# Patient Record
Sex: Male | Born: 1935 | Race: White | Hispanic: No | Marital: Married | Smoking: Former smoker
Health system: Southern US, Community
[De-identification: ages and names within clinical notes are randomized; demographics above are authoritative.]

## PROBLEM LIST (undated history)

## (undated) DIAGNOSIS — M1990 Unspecified osteoarthritis, unspecified site: Secondary | ICD-10-CM

## (undated) DIAGNOSIS — I10 Essential (primary) hypertension: Secondary | ICD-10-CM

## (undated) DIAGNOSIS — E039 Hypothyroidism, unspecified: Secondary | ICD-10-CM

## (undated) DIAGNOSIS — I6529 Occlusion and stenosis of unspecified carotid artery: Secondary | ICD-10-CM

## (undated) DIAGNOSIS — I2510 Atherosclerotic heart disease of native coronary artery without angina pectoris: Secondary | ICD-10-CM

## (undated) DIAGNOSIS — I441 Atrioventricular block, second degree: Secondary | ICD-10-CM

## (undated) DIAGNOSIS — E119 Type 2 diabetes mellitus without complications: Secondary | ICD-10-CM

## (undated) DIAGNOSIS — E785 Hyperlipidemia, unspecified: Secondary | ICD-10-CM

## (undated) DIAGNOSIS — I350 Nonrheumatic aortic (valve) stenosis: Secondary | ICD-10-CM

## (undated) HISTORY — PX: EYE SURGERY: SHX253

## (undated) HISTORY — PX: BACK SURGERY: SHX140

## (undated) HISTORY — DX: Type 2 diabetes mellitus without complications: E11.9

## (undated) HISTORY — DX: Occlusion and stenosis of unspecified carotid artery: I65.29

## (undated) HISTORY — DX: Atherosclerotic heart disease of native coronary artery without angina pectoris: I25.10

## (undated) HISTORY — DX: Nonrheumatic aortic (valve) stenosis: I35.0

## (undated) HISTORY — DX: Essential (primary) hypertension: I10

## (undated) HISTORY — DX: Hyperlipidemia, unspecified: E78.5

---

## 1999-11-10 HISTORY — PX: CARDIAC CATHETERIZATION: SHX172

## 1999-11-11 ENCOUNTER — Inpatient Hospital Stay (HOSPITAL_COMMUNITY): Admission: AD | Admit: 1999-11-11 | Discharge: 1999-11-17 | Payer: Self-pay | Admitting: Cardiovascular Disease

## 1999-11-12 ENCOUNTER — Encounter: Payer: Self-pay | Admitting: Surgery

## 1999-11-13 ENCOUNTER — Encounter: Payer: Self-pay | Admitting: Surgery

## 1999-11-13 HISTORY — PX: CORONARY ARTERY BYPASS GRAFT: SHX141

## 1999-11-14 ENCOUNTER — Encounter: Payer: Self-pay | Admitting: Cardiovascular Disease

## 1999-11-15 ENCOUNTER — Encounter: Payer: Self-pay | Admitting: Surgery

## 1999-11-16 ENCOUNTER — Encounter: Payer: Self-pay | Admitting: Cardiovascular Disease

## 2012-11-03 ENCOUNTER — Other Ambulatory Visit (HOSPITAL_COMMUNITY): Payer: Self-pay | Admitting: Cardiovascular Disease

## 2012-11-03 DIAGNOSIS — I6529 Occlusion and stenosis of unspecified carotid artery: Secondary | ICD-10-CM

## 2012-11-14 ENCOUNTER — Encounter (HOSPITAL_COMMUNITY): Payer: Self-pay

## 2012-12-05 ENCOUNTER — Ambulatory Visit (HOSPITAL_COMMUNITY)
Admission: RE | Admit: 2012-12-05 | Discharge: 2012-12-05 | Disposition: A | Discharge status: Home / Self Care | Payer: Medicare Other | Source: Ambulatory Visit | Attending: Cardiovascular Disease | Admitting: Cardiovascular Disease

## 2012-12-05 DIAGNOSIS — I6529 Occlusion and stenosis of unspecified carotid artery: Secondary | ICD-10-CM | POA: Insufficient documentation

## 2012-12-05 NOTE — Progress Notes (Signed)
Carotid duplex completed 

## 2013-06-05 ENCOUNTER — Other Ambulatory Visit (HOSPITAL_COMMUNITY): Payer: Self-pay | Admitting: Cardiovascular Disease

## 2013-06-05 DIAGNOSIS — I25810 Atherosclerosis of coronary artery bypass graft(s) without angina pectoris: Secondary | ICD-10-CM

## 2013-06-05 DIAGNOSIS — I739 Peripheral vascular disease, unspecified: Secondary | ICD-10-CM

## 2013-06-20 ENCOUNTER — Ambulatory Visit (HOSPITAL_COMMUNITY)
Admission: RE | Admit: 2013-06-20 | Discharge: 2013-06-20 | Disposition: A | Discharge status: Home / Self Care | Payer: Medicare Other | Source: Ambulatory Visit | Attending: Cardiovascular Disease | Admitting: Cardiovascular Disease

## 2013-06-20 DIAGNOSIS — I739 Peripheral vascular disease, unspecified: Secondary | ICD-10-CM

## 2013-06-20 DIAGNOSIS — I6529 Occlusion and stenosis of unspecified carotid artery: Secondary | ICD-10-CM

## 2013-06-21 NOTE — Progress Notes (Signed)
Carotid Duplex Completed. Koralyn Prestage, RDMS, RVT  

## 2013-07-20 ENCOUNTER — Encounter: Payer: Self-pay | Admitting: Cardiovascular Disease

## 2013-07-20 ENCOUNTER — Ambulatory Visit (INDEPENDENT_AMBULATORY_CARE_PROVIDER_SITE_OTHER): Payer: Medicare Other | Admitting: Cardiovascular Disease

## 2013-07-20 VITALS — BP 146/78 | HR 55 | Ht 69.0 in | Wt 177.5 lb

## 2013-07-20 DIAGNOSIS — I350 Nonrheumatic aortic (valve) stenosis: Secondary | ICD-10-CM

## 2013-07-20 DIAGNOSIS — I359 Nonrheumatic aortic valve disorder, unspecified: Secondary | ICD-10-CM

## 2013-07-20 DIAGNOSIS — I2510 Atherosclerotic heart disease of native coronary artery without angina pectoris: Secondary | ICD-10-CM

## 2013-07-20 DIAGNOSIS — E785 Hyperlipidemia, unspecified: Secondary | ICD-10-CM

## 2013-07-20 DIAGNOSIS — I779 Disorder of arteries and arterioles, unspecified: Secondary | ICD-10-CM

## 2013-07-20 DIAGNOSIS — E119 Type 2 diabetes mellitus without complications: Secondary | ICD-10-CM

## 2013-07-20 DIAGNOSIS — I10 Essential (primary) hypertension: Secondary | ICD-10-CM

## 2013-07-20 NOTE — Assessment & Plan Note (Signed)
On statin therapy followed by his PCP 

## 2013-07-20 NOTE — Progress Notes (Signed)
07/20/2013 Thomas English   1932/12/14  [id number]  Primary Physician Desmond Dike, MD Primary Cardiologist: Runell Gess MD [name]   HPI:  The patient is a very pleasant 79 year old, mildly overweight, married Caucasian male, father of 2, grandfather to 1 grandchild who I last saw 6 months ago. He has a history of CAD status post coronary artery bypass grafting November 12, 2012, with a LIMA to his LAD, a vein graft to the first obtuse marginal branch, second obtuse marginal branch and to the RCA. His other problems include hypertension, hyperlipidemia and noninsulin-requiring diabetes. He does have bilateral internal carotid artery stenosis left greater than right which has remained stable over the last 6 months. He is neurologically asymptomatic. He also has mild aortic stenosis with valve area of 1.4 cm squared. His last Myoview performed October 06, 2011, was low risk. He is asymptomatic. Dr. Sheria Lang follows his lipid profile. Since I saw him in the office 6 months ago he is completely asymptomatic and specifically but denies chest pain or shortness of breath.     Current Outpatient Prescriptions  Medication Sig Dispense Refill  . amLODipine-benazepril (LOTREL) 10-20 MG per capsule Take 1 capsule by mouth daily.      Marland Kitchen aspirin EC 81 MG tablet Take 81 mg by mouth daily.      . carvedilol (COREG) 6.25 MG tablet Take 6.25 mg by mouth 2 (two) times daily with a meal.      . clopidogrel (PLAVIX) 75 MG tablet Take 1 tablet by mouth daily.      Marland Kitchen levothyroxine (SYNTHROID, LEVOTHROID) 100 MCG tablet Take 1 tablet by mouth daily.      . Lutein 6 MG CAPS Take 1 capsule by mouth daily.      . metFORMIN (GLUCOPHAGE) 500 MG tablet Take 500 mg by mouth daily with breakfast.      . simvastatin (ZOCOR) 20 MG tablet Take 1 tablet by mouth daily.       No current facility-administered medications for this visit.    No Known Allergies  History   Social History  . Marital  Status: Married    Spouse Name: N/A    Number of Children: N/A  . Years of Education: N/A   Occupational History  . Not on file.   Social History Main Topics  . Smoking status: Former Smoker -- 1.00 packs/day for 5 years    Types: Cigarettes    Quit date: 07/20/1973  . Smokeless tobacco: Never Used  . Alcohol Use: No  . Drug Use: No  . Sexually Active: Not on file   Other Topics Concern  . Not on file   Social History Narrative  . No narrative on file     Review of Systems: General: negative for chills, fever, night sweats or weight changes.  Cardiovascular: negative for chest pain, dyspnea on exertion, edema, orthopnea, palpitations, paroxysmal nocturnal dyspnea or shortness of breath Dermatological: negative for rash Respiratory: negative for cough or wheezing Urologic: negative for hematuria Abdominal: negative for nausea, vomiting, diarrhea, bright red blood per rectum, melena, or hematemesis Neurologic: negative for visual changes, syncope, or dizziness All other systems reviewed and are otherwise negative except as noted above.    Blood pressure 146/78, pulse 55, height 5\' 9"  (1.753 m), weight 177 lb 8 oz (80.513 kg).  General appearance: alert and no distress Neck: no adenopathy, no JVD, supple, symmetrical, trachea midline, thyroid not enlarged, symmetric, no tenderness/mass/nodules and bilateral carotid bruits Lungs: clear to auscultation  bilaterally Heart: typical aortic stenosis murmur, 2/6 Extremities: extremities normal, atraumatic, no cyanosis or edema  EKG sinus bradycardia at 55 without ST or T wave changes  ASSESSMENT AND PLAN:   Coronary artery disease Patient underwent coronary artery bypass grafting 11/12/12 with a LIMA to his LAD, a vein to first obtuse marginal branch, second obtuse marginal branch and to the RCA. He has not had a stress test since his procedure. He denies chest pain or shortness of breath.  Aortic stenosis His last 2-D echo  performed 03/31/12 revealed normal LV function with a valve area of 1.4 cm peak gradient of 30 mmHg and mean gradient of 25 mm mercury. We'll recheck a 2-D echo again in January of next year  Carotid artery disease Daube duplex ultrasound. Briefly performed earlier this month with mild right and moderate left ICA stenosis. He was neurologically asymptomatic. We'll continue to follow this semiannually.  Hyperlipidemia On statin therapy followed by his PCP      Runell Gess MD [facility], [facility] 07/20/2013 4:41 PM

## 2013-07-20 NOTE — Assessment & Plan Note (Signed)
Daube duplex ultrasound. Briefly performed earlier this month with mild right and moderate left ICA stenosis. He was neurologically asymptomatic. We'll continue to follow this semiannually.

## 2013-07-20 NOTE — Patient Instructions (Signed)
  We will see you back in follow up in 6 months with an extender and 12 months with Dr Allyson Sabal  Dr Allyson Sabal has ordered an echocardiogram and carotid doppler to be done in January 2015

## 2013-07-20 NOTE — Assessment & Plan Note (Signed)
Patient underwent coronary artery bypass grafting 11/12/12 with a LIMA to his LAD, a vein to first obtuse marginal branch, second obtuse marginal branch and to the RCA. He has not had a stress test since his procedure. He denies chest pain or shortness of breath.

## 2013-07-20 NOTE — Assessment & Plan Note (Signed)
His last 2-D echo performed 03/31/12 revealed normal LV function with a valve area of 1.4 cm peak gradient of 30 mmHg and mean gradient of 25 mm mercury. We'll recheck a 2-D echo again in January of next year

## 2013-09-04 ENCOUNTER — Encounter (HOSPITAL_COMMUNITY): Payer: Self-pay | Admitting: Cardiovascular Disease

## 2013-09-20 HISTORY — PX: NM MYOCAR PERF WALL MOTION: HXRAD629

## 2013-09-21 ENCOUNTER — Ambulatory Visit (HOSPITAL_COMMUNITY)
Admission: RE | Admit: 2013-09-21 | Discharge: 2013-09-21 | Disposition: A | Discharge status: Home / Self Care | Payer: Medicare Other | Source: Ambulatory Visit | Attending: Cardiovascular Disease | Admitting: Cardiovascular Disease

## 2013-09-21 DIAGNOSIS — I25810 Atherosclerosis of coronary artery bypass graft(s) without angina pectoris: Secondary | ICD-10-CM

## 2013-09-21 DIAGNOSIS — I2510 Atherosclerotic heart disease of native coronary artery without angina pectoris: Secondary | ICD-10-CM

## 2013-09-21 MED ORDER — AMINOPHYLLINE 25 MG/ML IV SOLN
75.0000 mg | Freq: Once | INTRAVENOUS | Status: AC
  Administered 2013-09-21: 75 mg via INTRAVENOUS

## 2013-09-21 MED ORDER — TECHNETIUM TC 99M SESTAMIBI GENERIC - CARDIOLITE
31.1000 | Freq: Once | INTRAVENOUS | Status: AC | PRN
  Administered 2013-09-21: 31.1 via INTRAVENOUS

## 2013-09-21 MED ORDER — REGADENOSON 0.4 MG/5ML IV SOLN
0.4000 mg | Freq: Once | INTRAVENOUS | Status: AC
  Administered 2013-09-21: 0.4 mg via INTRAVENOUS

## 2013-09-21 MED ORDER — TECHNETIUM TC 99M SESTAMIBI GENERIC - CARDIOLITE
10.7000 | Freq: Once | INTRAVENOUS | Status: AC | PRN
  Administered 2013-09-21: 11 via INTRAVENOUS

## 2013-09-21 NOTE — Procedures (Addendum)
[facility] [address] [place] [address] [place] [address] [phone]  Cardiology Nuclear Med Study  RMANI KELLOGG is a 79 y.o. male     MRN : [id number]     DOB: 1932-02-17  Procedure Date: 09/21/2013  Nuclear Med Background Indication for Stress Test:  Graft Patency History:  Asthma and CAD, CABGx4 11/13 Cardiac Risk Factors: Carotid Disease, Family History - CAD, History of Smoking, Hypertension, Lipids, NIDDM and PVD  Symptoms:  None   Nuclear Pre-Procedure Caffeine/Decaff Intake:  7:00pm NPO After: 5:00am   IV Site: R Antecubital  IV 0.9% NS with Angio Cath:  22g  Chest Size (in):  44 IV Started by: Koren Shiver, CNMT  Height: 5\' 9"  (1.753 m)  Cup Size: n/a  BMI:  Body mass index is 26.13 kg/(m^2). Weight:  177 lb (80.287 kg)   Tech Comments:  n/a    Nuclear Med Study 1 or 2 day study: 1 day  Stress Test Type:  Lexiscan  Order Authorizing Provider:  Nanetta Batty, MD   Resting Radionuclide: Technetium 51m Sestamibi  Resting Radionuclide Dose: 10.7 mCi   Stress Radionuclide:  Technetium 54m Sestamibi  Stress Radionuclide Dose: 31.1 mCi           Stress Protocol Rest HR: 53 Stress HR: 93  Rest BP: 136/76 Stress BP: 143/72  Exercise Time (min): n/a METS: n/a   Predicted Max HR: 142 bpm % Max HR: 65.49 bpm Rate Pressure Product: 46962  Dose of Adenosine (mg):  n/a Dose of Lexiscan: 0.4 mg  Dose of Atropine (mg): n/a Dose of Dobutamine: n/a mcg/kg/min (at max HR)  Stress Test Technologist: Esperanza Sheets, CCT Nuclear Technologist: Gonzella Lex, CNMT   Rest Procedure:  Myocardial perfusion imaging was performed at rest 45 minutes following the intravenous administration of Technetium 75m Sestamibi. Stress Procedure:  The patient received IV Lexiscan 0.4 mg over 15-seconds.  Technetium 47m Sestamibi injected at 30-seconds.  The patient became orthostatic, 75 mg of IV Aminophylline was administered with resolution of  symptom.  There were no significant changes with Lexiscan.  Quantitative spect images were obtained after a 45 minute delay.  Transient Ischemic Dilatation (Normal <1.22):  1.13 Lung/Heart Ratio (Normal <0.45):  0.31 QGS EDV:  72 ml QGS ESV:  22 ml LV Ejection Fraction: 70%     Rest ECG: NSR - Normal EKG  Stress ECG: Nonspecific (,1 mm ) ST depression and T wave inversion in leads V5-V6, II, III, F during Lexiscan infusion  QPS Raw Data Images:  Normal; no motion artifact; normal heart/lung ratio. Stress Images:  Normal homogeneous uptake in all areas of the myocardium. Rest Images:  Normal homogeneous uptake in all areas of the myocardium. Subtraction (SDS):  No evidence of ischemia. LV Wall Motion:  NL LV Function; NL Wall Motion  Impression Exercise Capacity:  Lexiscan with no exercise. BP Response:  Normal blood pressure response. Clinical Symptoms:  No significant symptoms noted. ECG Impression:  Nonspecific ST depression with Lexiscan infusion Comparison with Prior Nuclear Study: Previous study described mid-anterior wall ischemia, not appreciated on this study   Overall Impression:  Normal stress nuclear study.   Thurmon Fair, MD  09/21/2013 12:47 PM

## 2013-09-29 ENCOUNTER — Encounter: Payer: Self-pay | Admitting: *Deleted

## 2013-12-21 HISTORY — PX: TRANSTHORACIC ECHOCARDIOGRAM: SHX275

## 2013-12-26 ENCOUNTER — Ambulatory Visit (HOSPITAL_COMMUNITY)
Admission: RE | Admit: 2013-12-26 | Discharge: 2013-12-26 | Disposition: A | Discharge status: Home / Self Care | Payer: Medicare Other | Source: Ambulatory Visit | Attending: Cardiovascular Disease | Admitting: Cardiovascular Disease

## 2013-12-26 DIAGNOSIS — I779 Disorder of arteries and arterioles, unspecified: Secondary | ICD-10-CM

## 2013-12-26 DIAGNOSIS — I672 Cerebral atherosclerosis: Secondary | ICD-10-CM | POA: Insufficient documentation

## 2013-12-26 DIAGNOSIS — I739 Peripheral vascular disease, unspecified: Secondary | ICD-10-CM

## 2013-12-26 NOTE — Progress Notes (Signed)
Carotid Duplex Completed. °Thomas English,RVT °

## 2014-01-02 ENCOUNTER — Encounter: Payer: Self-pay | Admitting: *Deleted

## 2014-01-02 ENCOUNTER — Telehealth: Payer: Self-pay | Admitting: *Deleted

## 2014-01-02 DIAGNOSIS — I6529 Occlusion and stenosis of unspecified carotid artery: Secondary | ICD-10-CM

## 2014-01-02 NOTE — Telephone Encounter (Signed)
Order placed for repeat carotid doppler in 1 year 

## 2014-01-02 NOTE — Telephone Encounter (Signed)
Message copied by Marella BileVOGEL, Linn Clavin W. on Tue Jan 02, 2014  3:50 PM ------      Message from: Runell GessBERRY, JONATHAN J      Created: Mon Jan 01, 2014  3:52 PM       No change from prior study. Repeat in 12 months. ------

## 2014-01-04 ENCOUNTER — Ambulatory Visit (HOSPITAL_COMMUNITY)
Admission: RE | Admit: 2014-01-04 | Discharge: 2014-01-04 | Disposition: A | Discharge status: Home / Self Care | Payer: Medicare Other | Source: Ambulatory Visit | Attending: Cardiovascular Disease | Admitting: Cardiovascular Disease

## 2014-01-04 DIAGNOSIS — E785 Hyperlipidemia, unspecified: Secondary | ICD-10-CM | POA: Insufficient documentation

## 2014-01-04 DIAGNOSIS — I079 Rheumatic tricuspid valve disease, unspecified: Secondary | ICD-10-CM | POA: Insufficient documentation

## 2014-01-04 DIAGNOSIS — I2510 Atherosclerotic heart disease of native coronary artery without angina pectoris: Secondary | ICD-10-CM | POA: Insufficient documentation

## 2014-01-04 DIAGNOSIS — I359 Nonrheumatic aortic valve disorder, unspecified: Secondary | ICD-10-CM | POA: Insufficient documentation

## 2014-01-04 DIAGNOSIS — I350 Nonrheumatic aortic (valve) stenosis: Secondary | ICD-10-CM

## 2014-01-04 DIAGNOSIS — E119 Type 2 diabetes mellitus without complications: Secondary | ICD-10-CM | POA: Insufficient documentation

## 2014-01-04 DIAGNOSIS — I10 Essential (primary) hypertension: Secondary | ICD-10-CM | POA: Insufficient documentation

## 2014-01-04 NOTE — Progress Notes (Signed)
2D Echo Performed 10/25/2014    Thomas English, RCS  

## 2014-01-15 ENCOUNTER — Encounter: Payer: Self-pay | Admitting: *Deleted

## 2014-01-16 ENCOUNTER — Encounter: Payer: Self-pay | Admitting: Cardiovascular Disease

## 2014-01-17 ENCOUNTER — Ambulatory Visit: Payer: Medicare Other | Admitting: Cardiology

## 2014-02-07 ENCOUNTER — Ambulatory Visit (INDEPENDENT_AMBULATORY_CARE_PROVIDER_SITE_OTHER): Payer: Medicare Other | Admitting: Cardiology

## 2014-02-07 ENCOUNTER — Encounter: Payer: Self-pay | Admitting: Cardiology

## 2014-02-07 VITALS — BP 128/62 | HR 61 | Ht 71.0 in | Wt 179.8 lb

## 2014-02-07 DIAGNOSIS — E785 Hyperlipidemia, unspecified: Secondary | ICD-10-CM

## 2014-02-07 DIAGNOSIS — I2510 Atherosclerotic heart disease of native coronary artery without angina pectoris: Secondary | ICD-10-CM

## 2014-02-07 DIAGNOSIS — I6529 Occlusion and stenosis of unspecified carotid artery: Secondary | ICD-10-CM | POA: Insufficient documentation

## 2014-02-07 DIAGNOSIS — I739 Peripheral vascular disease, unspecified: Secondary | ICD-10-CM

## 2014-02-07 DIAGNOSIS — I359 Nonrheumatic aortic valve disorder, unspecified: Secondary | ICD-10-CM

## 2014-02-07 DIAGNOSIS — I350 Nonrheumatic aortic (valve) stenosis: Secondary | ICD-10-CM

## 2014-02-07 DIAGNOSIS — I779 Disorder of arteries and arterioles, unspecified: Secondary | ICD-10-CM

## 2014-02-07 DIAGNOSIS — E119 Type 2 diabetes mellitus without complications: Secondary | ICD-10-CM

## 2014-02-07 DIAGNOSIS — I10 Essential (primary) hypertension: Secondary | ICD-10-CM

## 2014-02-07 NOTE — Assessment & Plan Note (Signed)
No chest pain, no complaints.

## 2014-02-07 NOTE — Assessment & Plan Note (Signed)
Stable, asymptomatic. 

## 2014-02-07 NOTE — Patient Instructions (Signed)
Your aortic valve is tighter, which may cause shortness of breath, chest pain, or dizziness.  If these symptoms occur please call..  In the meantime will discuss with Dr. Allyson SabalBerry and decide what test he would like you to have that would define this more.  We will call you this afternoon.  Follow up with Dr. Allyson SabalBerry in 6 weeks.

## 2014-02-07 NOTE — Assessment & Plan Note (Signed)
Followed by PCP

## 2014-02-07 NOTE — Assessment & Plan Note (Signed)
Last lipids stable

## 2014-02-07 NOTE — Assessment & Plan Note (Signed)
controlled 

## 2014-02-07 NOTE — Assessment & Plan Note (Signed)
Now by Echo mod to severe AS.  Pt asymptomatic. Discussed with Dr. Allyson SabalBerry he would like to repeat Echo in 6 months.  Dr. Rennis GoldenHilty did discuss with Pt at time of visit - reviewing symptoms to call us for.  I also discussed these symptoms with the pt's wife.

## 2014-02-07 NOTE — Progress Notes (Signed)
02/07/2014   PCP: Desmond DikeAMERON,JOHN, MD   Chief Complaint  Patient presents with  . ROV 6 months    No complaints.    Primary Cardiologist: Dr. Allyson SabalBerry  HPI: 80 year old, mildly overweight, married Caucasian male, father of 2, grandfather to 1 grandchild who is followed by Dr. Allyson SabalBerry. He has a history of CAD status post coronary artery bypass grafting 2000, with a LIMA to his LAD, a vein graft to the first obtuse marginal branch, second obtuse marginal branch and to the RCA. His other problems include hypertension, hyperlipidemia and noninsulin-requiring diabetes. He does have bilateral internal carotid artery stenosis left greater than right which has remained stable over the last 6 months. He is neurologically asymptomatic. He also now has mod to severe aortic stenosis with valve area of 0.79 cm squared. His last Myoview performed October 06, 2011, was low risk. He is asymptomatic. Dr. Sheria Langameron follows his lipid profile. Since I saw him in the office 6 months ago he is completely asymptomatic denies chest pain or shortness of breath.  He shoveled snow yesterday without problems.      No Known Allergies  Current Outpatient Prescriptions  Medication Sig Dispense Refill  . amLODipine-benazepril (LOTREL) 10-20 MG per capsule Take 1 capsule by mouth daily.      Marland Kitchen. aspirin EC 81 MG tablet Take 81 mg by mouth daily.      . carvedilol (COREG) 6.25 MG tablet Take 6.25 mg by mouth 2 (two) times daily with a meal.      . clopidogrel (PLAVIX) 75 MG tablet Take 1 tablet by mouth daily.      Marland Kitchen. levothyroxine (SYNTHROID, LEVOTHROID) 100 MCG tablet Take 1 tablet by mouth daily.      . Lutein 6 MG CAPS Take 1 capsule by mouth daily.      . metFORMIN (GLUCOPHAGE) 500 MG tablet Take 500 mg by mouth daily with breakfast.      . simvastatin (ZOCOR) 20 MG tablet Take 1 tablet by mouth daily.       No current facility-administered medications for this visit.    Past Medical History  Diagnosis  Date  . Hypertension   . Hyperlipidemia   . Diabetes mellitus type 2, noninsulin dependent   . Carotid stenosis     bilateral internal, left greater than right, stable  . Aortic stenosis   . CAD (coronary artery disease)   . S/P CABG (coronary artery bypass graft) 10/1999    LIMA to LAD and a vein graft to the first OM branch, second OM branch, and to the RCA    Past Surgical History  Procedure Laterality Date  . Cardiac catheterization  11/10/1999    3-vessel disease, recommendation for CABG  . Coronary artery bypass graft  11/13/1999    LIMA to LAD and a vein graft to the first OM branch, second OM branch, and to the RCA (Dr. Wayland SalinasB. Bartle)  . Transthoracic echocardiogram  12/2013    EF 55-60%, grade 1 diastolic dysfunction, high ventricular filling pressure; severe AV stenosis; calcified MV annulus; LA & RA mildly dilated; atrial septal aneurysm  . Nm myocar perf wall motion  09/2013    normal lexiscan myoview    ZOX:WRUEAVW:UJROS:General:no colds or fevers, no weight changes Skin:no rashes or ulcers HEENT:no blurred vision, no congestion CV:see HPI PUL:see HPI GI:no diarrhea constipation or melena, no indigestion GU:no hematuria, no dysuria MS:no joint pain, no claudication Neuro:no syncope, no lightheadedness Endo:+ diabetes followed  by PCP, no thyroid disease  PHYSICAL EXAM BP 128/62  Pulse 61  Ht 5\' 11"  (1.803 m)  Wt 179 lb 12.8 oz (81.557 kg)  BMI 25.09 kg/m2 General:Pleasant affect, NAD Skin:Warm and dry, brisk capillary refill HEENT:normocephalic, sclera clear, mucus membranes moist Neck:supple, no JVD, harsh bil carotid bruits  Heart:S1S2 RRR with 2-3/6 systolic aortic murmur best heart 2nd ICS rt of sternum but do hear laterally, no radiation to back or abd. , no gallup, rub or click Lungs:clear without rales, rhonchi, or wheezes WUJ:WJXB, non tender, + BS, do not palpate liver spleen or masses Ext:no lower ext edema, 2+ pedal pulses, 2+ radial pulses Neuro:alert and  oriented, MAE, follows commands, + facial symmetry  EKG:SR 1st degree AV block PR interval 264 ms no acute changes  ASSESSMENT AND PLAN Aortic stenosis Now by Echo mod to severe AS.  Pt asymptomatic. Discussed with Dr. Allyson Sabal he would like to repeat Echo in 6 months.  Dr. Rennis Golden did discuss with Pt at time of visit - reviewing symptoms to call us for.  I also discussed these symptoms with the pt's wife.      Carotid artery disease, hx CABG 2000, LIMA-LAD;VG-1st & 2nd OM of LCX VG-RCA, LOW RISK NUC 2012 No chest pain, no complaints.  Carotid stenosis, bil, last dopplers 12/2013 50-69% Stable, asymptomatic  Hyperlipidemia Last lipids stable  Type 2 diabetes mellitus Followed by PCP  Essential hypertension controlled

## 2014-03-21 ENCOUNTER — Ambulatory Visit (INDEPENDENT_AMBULATORY_CARE_PROVIDER_SITE_OTHER): Payer: Medicare Other | Admitting: Cardiovascular Disease

## 2014-03-21 ENCOUNTER — Encounter: Payer: Self-pay | Admitting: Cardiovascular Disease

## 2014-03-21 VITALS — BP 148/64 | HR 60 | Ht 71.0 in | Wt 177.1 lb

## 2014-03-21 DIAGNOSIS — I739 Peripheral vascular disease, unspecified: Principal | ICD-10-CM

## 2014-03-21 DIAGNOSIS — I2510 Atherosclerotic heart disease of native coronary artery without angina pectoris: Secondary | ICD-10-CM

## 2014-03-21 DIAGNOSIS — I779 Disorder of arteries and arterioles, unspecified: Secondary | ICD-10-CM

## 2014-03-21 DIAGNOSIS — I350 Nonrheumatic aortic (valve) stenosis: Secondary | ICD-10-CM

## 2014-03-21 DIAGNOSIS — I359 Nonrheumatic aortic valve disorder, unspecified: Secondary | ICD-10-CM

## 2014-03-21 DIAGNOSIS — I10 Essential (primary) hypertension: Secondary | ICD-10-CM

## 2014-03-21 DIAGNOSIS — E785 Hyperlipidemia, unspecified: Secondary | ICD-10-CM

## 2014-03-21 NOTE — Assessment & Plan Note (Addendum)
His aortic valve area has decreased from 1.4 cm squared to .79 cm squared. He is completely asymptomatic. We'll continue to follow semiannually.

## 2014-03-21 NOTE — Assessment & Plan Note (Signed)
Status post coronary artery bypass grafting 11/12/12 with a LIMA to his LAD, vein graft to the first obtuse marginal branch, second obtuse marginal branch and to the RCA. His last Myoview stress test was performed prior to that in 2012. He denies chest pain or shortness of breath. He is diabetic and will require repeat Myoview stress testing every 3 years scheduled for November of this year.

## 2014-03-21 NOTE — Patient Instructions (Signed)
Your physician has requested that you have an echocardiogram in August. Echocardiography is a painless test that uses sound waves to create images of your heart. It provides your doctor with information about the size and shape of your heart and how well your heart's chambers and valves are working. This procedure takes approximately one hour. There are no restrictions for this procedure.  Your physician has requested that you have a lexiscan myoview in November. For further information please visit [email or web address]. Please follow instruction sheet, as given.  Dr Allyson SabalBerry wants you to follow-up in November - after your stress test. You will receive a reminder letter in the mail two months in advance. If you don't receive a letter, please call our office to schedule the follow-up appointment.

## 2014-03-21 NOTE — Assessment & Plan Note (Signed)
History of moderate bilateral ICA stenosis followed by duplex ultrasound which has remained stable. He is neurologically asymptomatic

## 2014-03-21 NOTE — Assessment & Plan Note (Signed)
Controlled on current medications 

## 2014-03-21 NOTE — Assessment & Plan Note (Signed)
On statin therapy followed by his PCP 

## 2014-03-21 NOTE — Progress Notes (Signed)
03/21/2014 Thomas English   07/09/1932  [id number]  Primary Physician Desmond DikeAMERON,JOHN, MD Primary Cardiologist: Runell GessJonathan J. Dareth Andrew MD Roseanne RenoFACP,FACC,FAHA, FSCAI   HPI:  The patient is a very pleasant 80 year old, mildly overweight, married Caucasian male, father of 2, grandfather to 1 grandchild who I last saw 6 months ago. He has a history of CAD status post coronary artery bypass grafting November 12, 2012, with a LIMA to his LAD, a vein graft to the first obtuse marginal branch, second obtuse marginal branch and to the RCA. His other problems include hypertension, hyperlipidemia and noninsulin-requiring diabetes. He does have bilateral internal carotid artery stenosis left greater than right which has remained stable over the last 6 months. He is neurologically asymptomatic. He also has mild aortic stenosis with valve area of 1.4 cm squared. His last Myoview performed October 06, 2011, was low risk. He is asymptomatic. Dr. Sheria Langameron follows his lipid profile. Since I saw him in the office 6 months ago he is completely asymptomatic and specifically but denies chest pain or shortness of breath.recent 2-D echo showed progression of his aortic stenosis now in the severe range with a valve area of 0. 7 9 cm. His carotid Dopplers likewise are moderately severe remained stable and he is neurologically asymptomatic.     Current Outpatient Prescriptions  Medication Sig Dispense Refill  . amLODipine-benazepril (LOTREL) 10-20 MG per capsule Take 1 capsule by mouth daily.      Marland Kitchen. aspirin EC 81 MG tablet Take 81 mg by mouth daily.      . carvedilol (COREG) 6.25 MG tablet Take 6.25 mg by mouth 2 (two) times daily with a meal.      . clopidogrel (PLAVIX) 75 MG tablet Take 1 tablet by mouth daily.      Marland Kitchen. levothyroxine (SYNTHROID, LEVOTHROID) 100 MCG tablet Take 1 tablet by mouth daily.      . Lutein 6 MG CAPS Take 1 capsule by mouth daily.      . metFORMIN (GLUCOPHAGE) 500 MG tablet Take 500 mg by mouth daily  with breakfast.      . simvastatin (ZOCOR) 20 MG tablet Take 1 tablet by mouth daily.       No current facility-administered medications for this visit.    No Known Allergies  History   Social History  . Marital Status: Married    Spouse Name: N/A    Number of Children: 2  . Years of Education: GED   Occupational History  . Not on file.   Social History Main Topics  . Smoking status: Former Smoker -- 1.00 packs/day for 5 years    Types: Cigarettes    Quit date: 07/20/1973  . Smokeless tobacco: Never Used  . Alcohol Use: No  . Drug Use: No  . Sexual Activity: Not on file   Other Topics Concern  . Not on file   Social History Narrative  . No narrative on file     Review of Systems: General: negative for chills, fever, night sweats or weight changes.  Cardiovascular: negative for chest pain, dyspnea on exertion, edema, orthopnea, palpitations, paroxysmal nocturnal dyspnea or shortness of breath Dermatological: negative for rash Respiratory: negative for cough or wheezing Urologic: negative for hematuria Abdominal: negative for nausea, vomiting, diarrhea, bright red blood per rectum, melena, or hematemesis Neurologic: negative for visual changes, syncope, or dizziness All other systems reviewed and are otherwise negative except as noted above.    Blood pressure 148/64, pulse 60, height 5\' 11"  (1.803  m), weight 177 lb 1.6 oz (80.332 kg).  General appearance: alert and no distress Neck: no adenopathy, no JVD, supple, symmetrical, trachea midline, thyroid not enlarged, symmetric, no tenderness/mass/nodules and loud bilateral carotid bruits Lungs: clear to auscultation bilaterally Heart: 3/6 outflow tract murmur consistent with severe aortic stenosis Extremities: extremities normal, atraumatic, no cyanosis or edema  EKG not performed today  ASSESSMENT AND PLAN:   Carotid artery disease, hx CABG 2000, LIMA-LAD;VG-1st & 2nd OM of LCX VG-RCA, LOW RISK NUC 2012 History  of moderate bilateral ICA stenosis followed by duplex ultrasound which has remained stable. He is neurologically asymptomatic  Essential hypertension Controlled on current medications  Hyperlipidemia On statin therapy followed by his PCP  Aortic stenosis His aortic valve area has decreased from 1.4 cm squared to .79 cm squared. He is completely asymptomatic. We'll continue to follow semiannually.  Coronary artery disease Status post coronary artery bypass grafting 11/12/12 with a LIMA to his LAD, vein graft to the first obtuse marginal branch, second obtuse marginal branch and to the RCA. His last Myoview stress test was performed prior to that in 2012. He denies chest pain or shortness of breath. He is diabetic and will require repeat Myoview stress testing every 3 years scheduled for November of this year.      Runell Gess MD FACP,FACC,FAHA, [facility] 03/21/2014 10:32 AM

## 2014-10-10 ENCOUNTER — Encounter (HOSPITAL_COMMUNITY): Payer: Self-pay | Admitting: *Deleted

## 2014-10-10 ENCOUNTER — Telehealth (HOSPITAL_COMMUNITY): Payer: Self-pay

## 2014-10-10 NOTE — Telephone Encounter (Signed)
Encounter complete. 

## 2014-10-12 ENCOUNTER — Telehealth: Payer: Self-pay | Admitting: *Deleted

## 2014-10-12 NOTE — Telephone Encounter (Signed)
Nuclear test for 10/27 is cancelled per Dr Allyson SabalBerry.  Patient had a nuclear test in 2014 and Dr Allyson SabalBerry wants one every 3 years. Patient is asymptomatic and insurance will not pay for testing at this point. Patient notified and agreeable that test is cancelled.

## 2014-10-15 ENCOUNTER — Telehealth: Payer: Self-pay | Admitting: *Deleted

## 2014-10-15 NOTE — Telephone Encounter (Signed)
Message copied by Marella BileVOGEL, Jelani Trueba W. on Mon Oct 15, 2014  9:38 AM ------      Message from: Runell GessBERRY, JONATHAN J      Created: Sat Oct 13, 2014  2:49 PM       Can wait 3 years from 2014 to order            JJB      ----- Message -----         From: Clide Daleshonda W Williams         Sent: 10/05/2014   8:52 AM           To: Marella BileKathryn W Jolea Dolle, RN, Runell GessJonathan J Berry, MD            Patient is scheduled for a nuc study 10/16/14.  In JB's last note of 03/21/14, he states that patient needs this q 3 years unless he presents w/sx.  Patient had nuc study in Oct 2014.  Not sure I can get an [place].  Let me know regarding whether to try to get auth or not.            Thanks!!            Rhonda       ------

## 2014-10-16 ENCOUNTER — Encounter (HOSPITAL_COMMUNITY): Payer: Medicare Other

## 2014-10-23 ENCOUNTER — Encounter: Payer: Self-pay | Admitting: Cardiovascular Disease

## 2014-10-23 ENCOUNTER — Ambulatory Visit (INDEPENDENT_AMBULATORY_CARE_PROVIDER_SITE_OTHER): Payer: Medicare Other | Admitting: Cardiovascular Disease

## 2014-10-23 VITALS — BP 132/78 | HR 67 | Ht 71.0 in | Wt 176.9 lb

## 2014-10-23 DIAGNOSIS — I10 Essential (primary) hypertension: Secondary | ICD-10-CM

## 2014-10-23 DIAGNOSIS — E785 Hyperlipidemia, unspecified: Secondary | ICD-10-CM

## 2014-10-23 DIAGNOSIS — I6529 Occlusion and stenosis of unspecified carotid artery: Secondary | ICD-10-CM

## 2014-10-23 DIAGNOSIS — I350 Nonrheumatic aortic (valve) stenosis: Secondary | ICD-10-CM

## 2014-10-23 NOTE — Assessment & Plan Note (Signed)
History of moderate to moderately severe bilateral internal carotid artery stenosis by duplex ultrasound most recently in January of this year. We will recheck carotid Doppler studies. He is neurologically a symptomatically on clopidogrel

## 2014-10-23 NOTE — Assessment & Plan Note (Signed)
On statin therapy, followed by his PCP 

## 2014-10-23 NOTE — Assessment & Plan Note (Signed)
Controlled on current medications 

## 2014-10-23 NOTE — Assessment & Plan Note (Addendum)
Moderate aortic stenosis by 2-D echocardiogram last checked 01/04/14 with a valve area of 0.79 cm2 and normal LV function. He does have moderate dyspnea on exertion but denies chest pain. We will recheck a 2-D echocardiogram in February of next year

## 2014-10-23 NOTE — Patient Instructions (Signed)
  We will see you back in follow up in 6 months with Dr Allyson SabalBerry.   Dr Allyson SabalBerry has ordered: 1.  Echocardiogram (to be done in February). Echocardiography is a painless test that uses sound waves to create images of your heart. It provides your doctor with information about the size and shape of your heart and how well your heart's chambers and valves are working. This procedure takes approximately one hour. There are no restrictions for this procedure.   2. Carotid Duplex (to be done in February)- This test is an ultrasound of the carotid arteries in your neck. It looks at blood flow through these arteries that supply the brain with blood. Allow one hour for this exam. There are no restrictions or special instructions.

## 2014-10-23 NOTE — Progress Notes (Signed)
10/23/2014 Thomas English   04/01/1932  [id number]  Primary Physician Desmond DikeAMERON,JOHN, MD Primary Cardiologist: Runell GessJonathan J. Kashay Cavenaugh MD Roseanne RenoFACP,FACC,FAHA, FSCAI   HPI:  The patient is a very pleasant 80 year old, mildly overweight, married Caucasian male, father of 2, grandfather to 1 grandchild who I last saw 6 months ago. He has a history of CAD status post coronary artery bypass grafting November 12, 2012, with a LIMA to his LAD, a vein graft to the first obtuse marginal branch, second obtuse marginal branch and to the RCA. His other problems include hypertension, hyperlipidemia and noninsulin-requiring diabetes. He does have bilateral internal carotid artery stenosis left greater than right which has remained stable over the last 6 months. He is neurologically asymptomatic. He also has mild aortic stenosis with valve area of 1.4 cm squared. His last Myoview performed October 06, 2011, was low risk. He is asymptomatic. Dr. Sheria Langameron follows his lipid profile. Recent 2-D echo showed progression of his aortic stenosis now in the severe range with a valve area of 0. 7 9 cm. His carotid Dopplers likewise are moderately severe remained stable and he is neurologically asymptomatic. Since I saw him 6 months ago he's remained clinically stable with mild dyspnea but no chest pain   Current Outpatient Prescriptions  Medication Sig Dispense Refill  . amLODipine-benazepril (LOTREL) 10-20 MG per capsule Take 1 capsule by mouth daily.    . carvedilol (COREG) 6.25 MG tablet Take 6.25 mg by mouth 2 (two) times daily with a meal.    . clopidogrel (PLAVIX) 75 MG tablet Take 1 tablet by mouth daily.    Marland Kitchen. levothyroxine (SYNTHROID, LEVOTHROID) 100 MCG tablet Take 1 tablet by mouth daily.    . Lutein 6 MG CAPS Take 1 capsule by mouth daily.    . metFORMIN (GLUCOPHAGE) 500 MG tablet Take 500 mg by mouth daily with breakfast.    . simvastatin (ZOCOR) 20 MG tablet Take 1 tablet by mouth daily.     No current  facility-administered medications for this visit.    No Known Allergies  History   Social History  . Marital Status: Married    Spouse Name: N/A    Number of Children: 2  . Years of Education: GED   Occupational History  . Not on file.   Social History Main Topics  . Smoking status: Former Smoker -- 1.00 packs/day for 5 years    Types: Cigarettes    Quit date: 07/20/1973  . Smokeless tobacco: Never Used  . Alcohol Use: No  . Drug Use: No  . Sexual Activity: Not on file   Other Topics Concern  . Not on file   Social History Narrative     Review of Systems: General: negative for chills, fever, night sweats or weight changes.  Cardiovascular: negative for chest pain, dyspnea on exertion, edema, orthopnea, palpitations, paroxysmal nocturnal dyspnea or shortness of breath Dermatological: negative for rash Respiratory: negative for cough or wheezing Urologic: negative for hematuria Abdominal: negative for nausea, vomiting, diarrhea, bright red blood per rectum, melena, or hematemesis Neurologic: negative for visual changes, syncope, or dizziness All other systems reviewed and are otherwise negative except as noted above.    Blood pressure 132/78, pulse 67, height 5\' 11"  (1.803 m), weight 176 lb 14.4 oz (80.241 kg).  General appearance: alert and no distress Neck: no adenopathy, no JVD, supple, symmetrical, trachea midline, thyroid not enlarged, symmetric, no tenderness/mass/nodules and bilateral carotid bruits Lungs: clear to auscultation bilaterally Heart: 2/6 systolic ejection murmur  consistent with aortic stenosis Extremities: extremities normal, atraumatic, no cyanosis or edema  EKG normal sinus rhythm at 67 without ST or T-wave changes  ASSESSMENT AND PLAN:   Carotid artery disease, hx CABG 2000, LIMA-LAD;VG-1st & 2nd OM of LCX VG-RCA, LOW RISK NUC 2012 History of moderate to moderately severe bilateral internal carotid artery stenosis by duplex ultrasound most  recently in January of this year. We will recheck carotid Doppler studies. He is neurologically a symptomatically on clopidogrel  Aortic stenosis Moderate aortic stenosis by 2-D echocardiogram last checked 01/04/14 with a valve area of 0.79 cm2 and normal LV function. He does have moderate dyspnea on exertion but denies chest pain. We will recheck a 2-D echocardiogram in February of next year  Essential hypertension Controlled on current medications  Hyperlipidemia On statin therapy, followed by his PCP      Runell GessJonathan J. Teiara Baria MD [facility] HospitalFACP,FACC,FAHA, [facility] Center[facility]SCAI 10/23/2014 11:20 AM

## 2015-02-05 ENCOUNTER — Encounter: Payer: Self-pay | Admitting: Cardiovascular Disease

## 2015-02-28 ENCOUNTER — Ambulatory Visit (HOSPITAL_COMMUNITY)
Admission: RE | Admit: 2015-02-28 | Discharge: 2015-02-28 | Disposition: A | Discharge status: Home / Self Care | Payer: Medicare Other | Source: Ambulatory Visit | Attending: Cardiology | Admitting: Cardiology

## 2015-02-28 ENCOUNTER — Telehealth: Payer: Self-pay | Admitting: Cardiovascular Disease

## 2015-02-28 DIAGNOSIS — I350 Nonrheumatic aortic (valve) stenosis: Secondary | ICD-10-CM | POA: Diagnosis not present

## 2015-02-28 NOTE — Progress Notes (Signed)
2D Echocardiogram Complete.  02/28/2015   Indy Kuck, RDCS  

## 2015-02-28 NOTE — Telephone Encounter (Signed)
Pt need to have tooth extracted. Can he stop his Plavix for several days?

## 2015-02-28 NOTE — Telephone Encounter (Signed)
He can stop his Plavix for his dental procedure and restarted afterwards

## 2015-02-28 NOTE — Telephone Encounter (Signed)
Spoke to wife  Appointment has not been set up yet. He will be seeing  Dr Apolinar JunesBrandon and Adele DanAmy Williams [place] [address] [place]  Wife is aware have to defer to Dr Allyson SabalBERRY-  Will contact wife with answer

## 2015-03-01 ENCOUNTER — Encounter: Payer: Self-pay | Admitting: *Deleted

## 2015-03-01 ENCOUNTER — Telehealth: Payer: Self-pay | Admitting: *Deleted

## 2015-03-01 DIAGNOSIS — I350 Nonrheumatic aortic (valve) stenosis: Secondary | ICD-10-CM

## 2015-03-01 NOTE — Telephone Encounter (Signed)
-----   Message from Runell GessJonathan J Berry, MD sent at 03/01/2015 10:01 AM EST ----- Nl LV fxn. Severe AS. No change from prior 2D last year. Repeat in 12 months

## 2015-03-01 NOTE — Telephone Encounter (Signed)
Order placed for repeat echo in 1 year 

## 2015-03-01 NOTE — Telephone Encounter (Signed)
Spoke to patient's wife. Notified her that patient may stop their plavix 5-7 days prior to the dental procedure and that it should be restarted the day after the procedure. She voiced understanding.

## 2015-05-07 ENCOUNTER — Ambulatory Visit (INDEPENDENT_AMBULATORY_CARE_PROVIDER_SITE_OTHER): Payer: Medicare Other | Admitting: Cardiovascular Disease

## 2015-05-07 ENCOUNTER — Encounter: Payer: Self-pay | Admitting: Cardiovascular Disease

## 2015-05-07 VITALS — BP 112/54 | HR 55 | Ht 71.0 in | Wt 173.0 lb

## 2015-05-07 DIAGNOSIS — I739 Peripheral vascular disease, unspecified: Secondary | ICD-10-CM

## 2015-05-07 DIAGNOSIS — I10 Essential (primary) hypertension: Secondary | ICD-10-CM

## 2015-05-07 DIAGNOSIS — E785 Hyperlipidemia, unspecified: Secondary | ICD-10-CM

## 2015-05-07 DIAGNOSIS — I2510 Atherosclerotic heart disease of native coronary artery without angina pectoris: Secondary | ICD-10-CM

## 2015-05-07 DIAGNOSIS — I350 Nonrheumatic aortic (valve) stenosis: Secondary | ICD-10-CM

## 2015-05-07 DIAGNOSIS — I779 Disorder of arteries and arterioles, unspecified: Secondary | ICD-10-CM | POA: Diagnosis not present

## 2015-05-07 NOTE — Assessment & Plan Note (Signed)
History of severe aortic stenosis with recent 2-D echo performed in March of this year revealing normal LV size, mild LVH with a valve area 0.71 and a peak gradient of 78 mmHg. He is asymptomatic. We talked about the future possibility of standard surgical AVR versus TAVR.

## 2015-05-07 NOTE — Assessment & Plan Note (Signed)
History of coronary artery disease status post bypass surgery in 2000. His last Myoview performed in 2012 is low risk. He denies chest pain or shortness of breath.

## 2015-05-07 NOTE — Assessment & Plan Note (Signed)
History of coronary artery disease with moderately severe bilateral internal carotid artery stenosis but the proximal ultrasound performed last year. Neurologically isn't manic on clopidogrel. We will repeat the study.

## 2015-05-07 NOTE — Assessment & Plan Note (Signed)
history of hypertension blood pressure measured at 112/54. He is on amlodipine, benazepril and carvedilol. Continue current meds at current dosing

## 2015-05-07 NOTE — Assessment & Plan Note (Signed)
History of hyperlipidemia on simvastatin 20 mg a day followed by his PCP 

## 2015-05-07 NOTE — Progress Notes (Signed)
05/07/2015 Thomas English   12/26/1931  [id number]  Primary Physician Desmond DikeAMERON,JOHN, MD Primary Cardiologist: Runell GessJonathan J. Jisella Ashenfelter MD Roseanne RenoFACP,FACC,FAHA, FSCAI   HPI:  The patient is a very pleasant 81 year old, mildly overweight, married Caucasian male, father of 2, grandfather to 1 grandchild who I last saw 6 months ago. He has a history of CAD status post coronary artery bypass grafting in  2000, with a LIMA to his LAD, a vein graft to the first obtuse marginal branch, second obtuse marginal branch and to the RCA. His other problems include hypertension, hyperlipidemia and noninsulin-requiring diabetes. He does have bilateral internal carotid artery stenosis left greater than right which has remained stable over the last 6 months. He is neurologically asymptomatic. He also has mild aortic stenosis with valve area of 1.4 cm squared. His last Myoview performed October 06, 2011, was low risk. He is asymptomatic. Dr. Sheria Langameron follows his lipid profile. Recent 2-D echo showed progression of his aortic stenosis now in the severe range with a valve area of 0. 71 cm. His carotid Dopplers likewise are moderately severe remained stable and he is neurologically asymptomatic. Since I saw him 6 months ago he's remained clinically stable with mild dyspnea but no chest pain   Current Outpatient Prescriptions  Medication Sig Dispense Refill  . amLODipine (NORVASC) 10 MG tablet Take 10 mg by mouth every morning.  6  . benazepril (LOTENSIN) 20 MG tablet Take 20 mg by mouth every morning.  6  . carvedilol (COREG) 6.25 MG tablet Take 6.25 mg by mouth 2 (two) times daily with a meal.    . clopidogrel (PLAVIX) 75 MG tablet Take 1 tablet by mouth daily.    Marland Kitchen. levothyroxine (SYNTHROID, LEVOTHROID) 100 MCG tablet Take 1 tablet by mouth daily.    . Lutein 6 MG CAPS Take 1 capsule by mouth daily.    . metFORMIN (GLUCOPHAGE) 500 MG tablet Take 500 mg by mouth daily with breakfast.    . simvastatin (ZOCOR) 20 MG tablet Take  1 tablet by mouth daily.     No current facility-administered medications for this visit.    No Known Allergies  History   Social History  . Marital Status: Married    Spouse Name: N/A  . Number of Children: 2  . Years of Education: GED   Occupational History  . Not on file.   Social History Main Topics  . Smoking status: Former Smoker -- 1.00 packs/day for 5 years    Types: Cigarettes    Quit date: 07/20/1973  . Smokeless tobacco: Never Used  . Alcohol Use: No  . Drug Use: No  . Sexual Activity: Not on file   Other Topics Concern  . Not on file   Social History Narrative     Review of Systems: General: negative for chills, fever, night sweats or weight changes.  Cardiovascular: negative for chest pain, dyspnea on exertion, edema, orthopnea, palpitations, paroxysmal nocturnal dyspnea or shortness of breath Dermatological: negative for rash Respiratory: negative for cough or wheezing Urologic: negative for hematuria Abdominal: negative for nausea, vomiting, diarrhea, bright red blood per rectum, melena, or hematemesis Neurologic: negative for visual changes, syncope, or dizziness All other systems reviewed and are otherwise negative except as noted above.    Blood pressure 112/54, pulse 55, height 5\' 11"  (1.803 m), weight 173 lb (78.472 kg).  General appearance: alert and no distress Neck: no adenopathy, no JVD, supple, symmetrical, trachea midline, thyroid not enlarged, symmetric, no tenderness/mass/nodules and bilateral  carotid bruits Lungs: clear to auscultation bilaterally Heart: 2/6 outflow tract murmur consistent with aortic stenosis Extremities: extremities normal, atraumatic, no cyanosis or edema  EKG sinus  55 without ST or T-wave changes. I personally reviewed this EKG  ASSESSMENT AND PLAN:   Hyperlipidemia History of hyperlipidemia on simvastatin 20 mg a day followed by his PCP   Essential hypertension history of hypertension blood pressure  measured at 112/54. He is on amlodipine, benazepril and carvedilol. Continue current meds at current dosing   Coronary artery disease History of coronary artery disease status post bypass surgery in 2000. His last Myoview performed in 2012 is low risk. He denies chest pain or shortness of breath.   Carotid stenosis, bil, last dopplers 12/2013 50-69% History of coronary artery disease with moderately severe bilateral internal carotid artery stenosis but the proximal ultrasound performed last year. Neurologically isn't manic on clopidogrel. We will repeat the study.   Aortic stenosis History of severe aortic stenosis with recent 2-D echo performed in March of this year revealing normal LV size, mild LVH with a valve area 0.71 and a peak gradient of 78 mmHg. He is asymptomatic. We talked about the future possibility of standard surgical AVR versus TAVR.       Runell GessJonathan J. Juvia Aerts MD FACP,FACC,FAHA, [facility]SCAI 05/07/2015 10:49 AM

## 2015-05-07 NOTE — Patient Instructions (Addendum)
Dr Allyson SabalBerry has requested that you have a carotid duplex now. This test is an ultrasound of the carotid arteries in your neck. It looks at blood flow through these arteries that supply the brain with blood. Allow one hour for this exam. There are no restrictions or special instructions.  Your physician has requested that you have an echocardiogram in March 2017. Echocardiography is a painless test that uses sound waves to create images of your heart. It provides your doctor with information about the size and shape of your heart and how well your heart's chambers and valves are working. This procedure takes approximately one hour. There are no restrictions for this procedure.  Your physician recommends that you schedule a follow-up appointment in 6 months with an extender.  Dr Allyson SabalBerry wants you to follow-up in 12 months.  You will receive a reminder letter in the mail two months in advance. If you don't receive a letter, please call our office to schedule the follow-up appointment.

## 2015-05-17 ENCOUNTER — Ambulatory Visit (HOSPITAL_COMMUNITY)
Admission: RE | Admit: 2015-05-17 | Discharge: 2015-05-17 | Disposition: A | Discharge status: Home / Self Care | Payer: Medicare Other | Source: Ambulatory Visit | Attending: Cardiology | Admitting: Cardiology

## 2015-05-17 DIAGNOSIS — I779 Disorder of arteries and arterioles, unspecified: Secondary | ICD-10-CM | POA: Diagnosis present

## 2015-05-17 DIAGNOSIS — I739 Peripheral vascular disease, unspecified: Secondary | ICD-10-CM

## 2015-05-22 ENCOUNTER — Encounter: Payer: Self-pay | Admitting: *Deleted

## 2015-11-04 ENCOUNTER — Encounter: Payer: Self-pay | Admitting: Physician Assistant

## 2015-11-04 ENCOUNTER — Ambulatory Visit (INDEPENDENT_AMBULATORY_CARE_PROVIDER_SITE_OTHER): Payer: Medicare Other | Admitting: Physician Assistant

## 2015-11-04 VITALS — BP 138/62 | HR 57 | Ht 71.0 in | Wt 174.8 lb

## 2015-11-04 DIAGNOSIS — I350 Nonrheumatic aortic (valve) stenosis: Secondary | ICD-10-CM

## 2015-11-04 DIAGNOSIS — I6523 Occlusion and stenosis of bilateral carotid arteries: Secondary | ICD-10-CM | POA: Diagnosis not present

## 2015-11-04 DIAGNOSIS — I739 Peripheral vascular disease, unspecified: Principal | ICD-10-CM

## 2015-11-04 DIAGNOSIS — I10 Essential (primary) hypertension: Secondary | ICD-10-CM | POA: Diagnosis not present

## 2015-11-04 DIAGNOSIS — I779 Disorder of arteries and arterioles, unspecified: Secondary | ICD-10-CM | POA: Diagnosis not present

## 2015-11-04 DIAGNOSIS — E785 Hyperlipidemia, unspecified: Secondary | ICD-10-CM

## 2015-11-04 NOTE — Progress Notes (Signed)
Patient ID: TARIK TEIXEIRA, male   DOB: 1932/09/28, 81 y.o.   MRN: [id number]    Date:  11/04/2015   ID:  WAYLEN DEPAOLO, DOB 01/26/1932, MRN [id number]  PCP:  Desmond Dike, MD  Primary Cardiologist:  Allyson Sabal  Chief Complaint  Patient presents with  . Follow-up    6 Months, pt stated he doing fine, no c/o     History of Present Illness: Thomas English is a very pleasant 81 year old, mildly overweight, married Caucasian male, father of 2, grandfather to 1 grandchild who saw Dr. Allyson Sabal in May 2016. He has a history of CAD status post coronary artery bypass grafting in2000, with a LIMA to his LAD, a vein graft to the first obtuse marginal branch, second obtuse marginal branch and to the RCA. His other problems include severe aortic stenosis, hypertension, hyperlipidemia and noninsulin-requiring diabetes. He does have bilateral internal carotid artery stenosis left greater than right which has remained stable. The patient's last 2-D echocardiogram was March 2016 and revealed peak and mean gradients of 78 and 44 mmHg respectively. Calculated valve area was 0.71 cm^2.  His last Myoview performed October 06, 2011, was low risk.  Dr. Sheria Lang follows his lipid profile.  His carotid Dopplers are moderately severe and remained stable.  Patient presents for six-month follow-up visit. He reports doing very well. He walks 2-2 miles per day depending on how he feels. If he really pushes it he'll get some shortness of breath. He otherwise denies dizziness, orthopnea, chest pain, lower extremity edema. He also denies nausea, vomiting, fever, PND, cough, congestion, abdominal pain, hematochezia, melena, claudication.  Wt Readings from Last 3 Encounters:  11/04/15 174 lb 12.8 oz (79.289 kg)  05/07/15 173 lb (78.472 kg)  10/23/14 176 lb 14.4 oz (80.241 kg)     Past Medical History  Diagnosis Date  . Hypertension   . Hyperlipidemia   . Diabetes mellitus type 2, noninsulin dependent   . Carotid stenosis    bilateral internal, left greater than right, stable  . Aortic stenosis   . CAD (coronary artery disease)   . S/P CABG (coronary artery bypass graft) 10/1999    LIMA to LAD and a vein graft to the first OM branch, second OM branch, and to the RCA    Current Outpatient Prescriptions  Medication Sig Dispense Refill  . amLODipine (NORVASC) 10 MG tablet Take 10 mg by mouth every morning.  6  . benazepril (LOTENSIN) 20 MG tablet Take 20 mg by mouth every morning.  6  . carvedilol (COREG) 6.25 MG tablet Take 6.25 mg by mouth 2 (two) times daily with a meal.    . clopidogrel (PLAVIX) 75 MG tablet Take 1 tablet by mouth daily.    Marland Kitchen levothyroxine (SYNTHROID, LEVOTHROID) 100 MCG tablet Take 1 tablet by mouth daily.    . Lutein 6 MG CAPS Take 1 capsule by mouth daily.    . metFORMIN (GLUCOPHAGE) 500 MG tablet Take 500 mg by mouth daily with breakfast.    . simvastatin (ZOCOR) 20 MG tablet Take 1 tablet by mouth daily.     No current facility-administered medications for this visit.    Allergies:   No Known Allergies  Social History:  The patient  reports that he quit smoking about 42 years ago. His smoking use included Cigarettes. He has a 5 pack-year smoking history. He has never used smokeless tobacco. He reports that he does not drink alcohol or use illicit drugs.   Family history:  Family History  Problem Relation Age of Onset  . Lung disease Mother   . Colon cancer Father   . Diabetes Sister   . CAD Sister     ROS:  Please see the history of present illness.  All other systems reviewed and negative.   PHYSICAL EXAM: VS:  Ht 5\' 11"  (1.803 m)  Wt 174 lb 12.8 oz (79.289 kg)  BMI 24.39 kg/m2 Well nourished, well developed, in no acute distress HEENT: Pupils are equal round react to light accommodation extraocular movements are intact.  Neck: no JVDNo cervical lymphadenopathy. Cardiac: Regular rate and rhythm 3/6 systolic murmur loudest at the LSB Lungs:  clear to auscultation  bilaterally, no wheezing, rhonchi or rales Abd: soft, nontender, positive bowel sounds all quadrants, no hepatosplenomegaly Ext: no lower extremity edema.  2+ radial and dorsalis pedis pulses. Skin: warm and dry Neuro:  Grossly normal  EKG:   Sinus bradycardia rate 57 bpm first-degree AV block  ASSESSMENT AND PLAN:  Problem List Items Addressed This Visit    Hyperlipidemia   Essential hypertension   Coronary artery disease: hx CABG 2000, LIMA-LAD;VG-1st & 2nd OM of LCX VG-RCA, LOW RISK NUC 2012 - Primary   Carotid stenosis, bil, last dopplers 12/2013 50-69% (Chronic)   Aortic stenosis      Aortic stenosis, severe Patient is asymptomatic. Currently walking 2-2.5 miles per day with very little dyspnea and  no chest pain.  Recheck an echocardiogram in March 2017 Essential hypertension  Mildly elevated no changes in meds. Coronary artery disease  No complaints of angina. Is walking 2-2.5 miles per day. Carotid stenosis Asymptomatic. It's difficult to differentiate worsening bruits due to radiation from his aortic stenosis. Recheck carotid Dopplers in March 2017

## 2015-11-04 NOTE — Patient Instructions (Signed)
Your physician wants you to follow-up in: April/May 2017 You will receive a reminder letter in the mail two months in advance. If you don't receive a letter, please call our office to schedule the follow-up appointment.  Your physician has requested that you have a carotid duplex. This test is an ultrasound of the carotid arteries in your neck. It looks at blood flow through these arteries that supply the brain with blood. Allow one hour for this exam. There are no restrictions or special instructions. In March 2017

## 2016-02-10 DIAGNOSIS — E785 Hyperlipidemia, unspecified: Secondary | ICD-10-CM | POA: Diagnosis not present

## 2016-02-10 DIAGNOSIS — D649 Anemia, unspecified: Secondary | ICD-10-CM | POA: Diagnosis not present

## 2016-02-10 DIAGNOSIS — E1122 Type 2 diabetes mellitus with diabetic chronic kidney disease: Secondary | ICD-10-CM | POA: Diagnosis not present

## 2016-02-10 DIAGNOSIS — Z9181 History of falling: Secondary | ICD-10-CM | POA: Diagnosis not present

## 2016-02-10 DIAGNOSIS — N183 Chronic kidney disease, stage 3 (moderate): Secondary | ICD-10-CM | POA: Diagnosis not present

## 2016-02-10 DIAGNOSIS — I10 Essential (primary) hypertension: Secondary | ICD-10-CM | POA: Diagnosis not present

## 2016-02-10 DIAGNOSIS — Z79899 Other long term (current) drug therapy: Secondary | ICD-10-CM | POA: Diagnosis not present

## 2016-02-10 DIAGNOSIS — Z125 Encounter for screening for malignant neoplasm of prostate: Secondary | ICD-10-CM | POA: Diagnosis not present

## 2016-02-10 DIAGNOSIS — E039 Hypothyroidism, unspecified: Secondary | ICD-10-CM | POA: Diagnosis not present

## 2016-02-28 ENCOUNTER — Ambulatory Visit (HOSPITAL_COMMUNITY): Payer: Medicare Other | Attending: Cardiology

## 2016-02-28 ENCOUNTER — Other Ambulatory Visit: Payer: Self-pay

## 2016-02-28 DIAGNOSIS — I359 Nonrheumatic aortic valve disorder, unspecified: Secondary | ICD-10-CM | POA: Diagnosis present

## 2016-02-28 DIAGNOSIS — I350 Nonrheumatic aortic (valve) stenosis: Secondary | ICD-10-CM

## 2016-02-28 DIAGNOSIS — I059 Rheumatic mitral valve disease, unspecified: Secondary | ICD-10-CM | POA: Diagnosis not present

## 2016-02-28 DIAGNOSIS — E785 Hyperlipidemia, unspecified: Secondary | ICD-10-CM | POA: Insufficient documentation

## 2016-02-28 DIAGNOSIS — I272 Other secondary pulmonary hypertension: Secondary | ICD-10-CM | POA: Insufficient documentation

## 2016-02-28 DIAGNOSIS — I10 Essential (primary) hypertension: Secondary | ICD-10-CM | POA: Insufficient documentation

## 2016-02-28 DIAGNOSIS — I352 Nonrheumatic aortic (valve) stenosis with insufficiency: Secondary | ICD-10-CM | POA: Insufficient documentation

## 2016-02-28 DIAGNOSIS — E119 Type 2 diabetes mellitus without complications: Secondary | ICD-10-CM | POA: Diagnosis not present

## 2016-02-28 LAB — ECHOCARDIOGRAM COMPLETE
A4CEF: 69 %
AO mean calculated velocity dopler: 355 cm/s
AOPV: 0.2 m/s
AV Peak grad: 86 mmHg
AV peak Index: 0.32
AV vel: 0.63
AVG: 56 mmHg
Ao-asc: 32 mm
CHL CUP AORTIC ROOT 2D: 31 mm
CHL CUP ESTIMATED CVP: 8 mmHg
CHL CUP LA VOL 2D INDEX: 29.5 mL/m2
CHL CUP LA VOL 2D: 59 mL
CHL CUP LVOT SV INDEX: 45 mL/m2
CHL CUP MV DEC (S): 208 ms
E decel time: 208 msec
E/e' ratio: 6.09
FS: 41 % (ref 28–44)
IV/PV OW: 1.14
LA ID, A-P, ES: 44 mm
LA SIZE INDEX: 2.2 mm/m2
LA diam end sys: 44 mm
LA vol index: 29.5 mL/m2
LAVOL: 59 mL
LV PW d: 9.49 mm — AB (ref 0.6–1.1)
LV TDI E'MEDIAL: 7.41 cm/s
LV dias vol index: 39 mL/m2
LV dias vol: 78 mL (ref 62–150)
LV sys vol index: 13 mL/m2
LV sys vol: 78 mL — AB (ref 21–61)
LVIDD: 46.4 mm — AB (ref 3.5–6.0)
LVIDS: 27.3 mm — AB (ref 2.1–4.0)
LVOT MEAN VEL: 66.7 cm/s
LVOT SV: 89 mL
LVOT VTI: 28.3 cm
LVOT area: 3.14 cm2
LVOT diameter: 20 mm
LVOT peak vel: 94.3 cm/s
LVOTVTI: 0.2 cm
MV Peak grad: 2 mmHg
MV pk E vel: 72.5 cm/s
MVPKAVEL: 83.6 cm/s
PWSYS: 9.49 mm
SV INDEX: 26.5 mL/m2
Simpson's disk: 68
Stroke v: 53 ml
TDI e' lateral: 11.9 cm/s
TR max vel: 287 cm/s
TV PEAK RV-RA GRADIENT: 33 cm/s
VTI: 142 cm
Valve area index: 0.32
Valve area: 0.63 cm2

## 2016-03-03 ENCOUNTER — Ambulatory Visit (HOSPITAL_COMMUNITY)
Admission: RE | Admit: 2016-03-03 | Discharge: 2016-03-03 | Disposition: A | Discharge status: Home / Self Care | Payer: Medicare Other | Source: Ambulatory Visit | Attending: Physician Assistant | Admitting: Physician Assistant

## 2016-03-03 DIAGNOSIS — I10 Essential (primary) hypertension: Secondary | ICD-10-CM | POA: Diagnosis not present

## 2016-03-03 DIAGNOSIS — E785 Hyperlipidemia, unspecified: Secondary | ICD-10-CM | POA: Diagnosis not present

## 2016-03-03 DIAGNOSIS — I6523 Occlusion and stenosis of bilateral carotid arteries: Secondary | ICD-10-CM | POA: Insufficient documentation

## 2016-03-03 DIAGNOSIS — I779 Disorder of arteries and arterioles, unspecified: Secondary | ICD-10-CM | POA: Diagnosis not present

## 2016-03-03 DIAGNOSIS — E119 Type 2 diabetes mellitus without complications: Secondary | ICD-10-CM | POA: Diagnosis not present

## 2016-03-03 DIAGNOSIS — I739 Peripheral vascular disease, unspecified: Secondary | ICD-10-CM

## 2016-04-07 ENCOUNTER — Encounter: Payer: Self-pay | Admitting: Cardiovascular Disease

## 2016-04-07 ENCOUNTER — Ambulatory Visit (INDEPENDENT_AMBULATORY_CARE_PROVIDER_SITE_OTHER): Payer: Medicare Other | Admitting: Cardiovascular Disease

## 2016-04-07 VITALS — BP 132/70 | HR 66 | Ht 71.0 in | Wt 169.4 lb

## 2016-04-07 DIAGNOSIS — I6523 Occlusion and stenosis of bilateral carotid arteries: Secondary | ICD-10-CM | POA: Diagnosis not present

## 2016-04-07 DIAGNOSIS — I350 Nonrheumatic aortic (valve) stenosis: Secondary | ICD-10-CM | POA: Diagnosis not present

## 2016-04-07 DIAGNOSIS — I10 Essential (primary) hypertension: Secondary | ICD-10-CM

## 2016-04-07 DIAGNOSIS — I2510 Atherosclerotic heart disease of native coronary artery without angina pectoris: Secondary | ICD-10-CM

## 2016-04-07 DIAGNOSIS — E785 Hyperlipidemia, unspecified: Secondary | ICD-10-CM

## 2016-04-07 DIAGNOSIS — I2583 Coronary atherosclerosis due to lipid rich plaque: Secondary | ICD-10-CM

## 2016-04-07 NOTE — Progress Notes (Signed)
04/07/2016 Thomas English   [birth year]  [id number]  Primary Physician Thomas Ka, MD Primary Cardiologist: Runell Gess MD [name]   HPI:  The patient is a very pleasant 82 year old, mildly overweight, married Caucasian male, father of 2, grandfather to 1 grandchild who I last saw 05/07/15. He is accompanied by his wife today. He has a history of CAD status post coronary artery bypass grafting in 2000, with a LIMA to his LAD, a vein graft to the first obtuse marginal branch, second obtuse marginal branch and to the RCA. His other problems include hypertension, hyperlipidemia and noninsulin-requiring diabetes. He does have bilateral internal carotid artery stenosis left greater than right which has remained stable over the last 6 months. He is neurologically asymptomatic. He also has mild aortic stenosis with valve area of 1.4 cm squared. His last Myoview performed October 06, 2011, was low risk. He is asymptomatic. Dr. Sheria Lang follows his lipid profile. He has had progressive aortic stenosis with his most recent echo performed 02/28/16 were revealing preserved LV function with a peak aortic gradient of 86 m of mercury and valve area of 0.63 cm2. He really denies dyspnea or chest pain. He also has moderate bilateral ICA stenosis by duplex ultrasound.  Current Outpatient Prescriptions  Medication Sig Dispense Refill  . amLODipine (NORVASC) 10 MG tablet Take 10 mg by mouth every morning.  6  . benazepril (LOTENSIN) 20 MG tablet Take 20 mg by mouth every morning.  6  . carvedilol (COREG) 6.25 MG tablet Take 6.25 mg by mouth 2 (two) times daily with a meal.    . clopidogrel (PLAVIX) 75 MG tablet Take 1 tablet by mouth daily.    Marland Kitchen levothyroxine (SYNTHROID, LEVOTHROID) 50 MCG tablet Take 50 mcg by mouth daily before breakfast.    . Lutein 6 MG CAPS Take 1 capsule by mouth daily.    . metFORMIN (GLUCOPHAGE) 500 MG tablet Take 500 mg by mouth daily with breakfast.    .  simvastatin (ZOCOR) 20 MG tablet Take 1 tablet by mouth daily.     No current facility-administered medications for this visit.    No Known Allergies  Social History   Social History  . Marital Status: Married    Spouse Name: N/A  . Number of Children: 2  . Years of Education: GED   Occupational History  . Not on file.   Social History Main Topics  . Smoking status: Former Smoker -- 1.00 packs/day for 5 years    Types: Cigarettes    Quit date: 07/20/1973  . Smokeless tobacco: Never Used  . Alcohol Use: No  . Drug Use: No  . Sexual Activity: Not on file   Other Topics Concern  . Not on file   Social History Narrative     Review of Systems: General: negative for chills, fever, night sweats or weight changes.  Cardiovascular: negative for chest pain, dyspnea on exertion, edema, orthopnea, palpitations, paroxysmal nocturnal dyspnea or shortness of breath Dermatological: negative for rash Respiratory: negative for cough or wheezing Urologic: negative for hematuria Abdominal: negative for nausea, vomiting, diarrhea, bright red blood per rectum, melena, or hematemesis Neurologic: negative for visual changes, syncope, or dizziness All other systems reviewed and are otherwise negative except as noted above.    Blood pressure 132/70, pulse 66, height  (1.803 m), weight 169 lb 6.4 oz (76.839 kg), SpO2 98 %.  General appearance: alert and no distress Neck: no adenopathy, no JVD, supple, symmetrical, trachea  midline, thyroid not enlarged, symmetric, no tenderness/mass/nodules and bilateral carotid bruits Lungs: clear to auscultation bilaterally Heart: 3/6 systolic ejection murmur at the base consistent with aortic stenosis Extremities: extremities normal, atraumatic, no cyanosis or edema  EKG not performed today  ASSESSMENT AND PLAN:   Carotid stenosis, bil, last dopplers 12/2013 50-69% History of bilateral internal carotid artery stenosis demonstrated most recently  by triplets ultrasound 03/03/16. He has moderate right and mild to moderate left. He is neurologically asymptomatic.  Aortic stenosis History of severe aortic stenosis which has been progressive progressive duplex ultrasound. His most recent echo performed 03/02/16 revealed preserved LV function with a peak gradient of 86 L of mercury valve area of 0.63 cm2, somewhat progressive compared to the last echo. He denies significant dyspnea or chest pain. We talked about the possibility of TAVR  in the future.  Coronary artery disease: hx CABG 2000, LIMA-LAD;VG-1st & 2nd OM of LCX VG-RCA, LOW RISK NUC 2012 History of coronary artery disease status post coronary artery bypass grafting in 2000 with a LIMA to LAD, vein to first and second marginal branch and to the RCA. His last Myoview performed 10/06/11 was low risk and nonischemic. He denies chest pain.  Essential hypertension History of hypertension with blood pressure measured today 132/70. He is on amlodipine, benazepril and carvedilol. Continue current meds occur doesn't  Hyperlipidemia History of hyperlipidemia on statin therapy followed by history.      Runell GessJonathan J. Nyaire Denbleyker MD FACP,FACC,FAHA, [facility] 04/07/2016 10:37 AM

## 2016-04-07 NOTE — Assessment & Plan Note (Signed)
History of hyperlipidemia on statin therapy followed by history.

## 2016-04-07 NOTE — Assessment & Plan Note (Signed)
History of bilateral internal carotid artery stenosis demonstrated most recently by triplets ultrasound 03/03/16. He has moderate right and mild to moderate left. He is neurologically asymptomatic.

## 2016-04-07 NOTE — Patient Instructions (Signed)
Medication Instructions:  Your physician recommends that you continue on your current medications as directed. Please refer to the Current Medication list given to you today.   Labwork: I will get your lab work from your Primary Care Physician.   Testing/Procedures: Your physician has requested that you have an echocardiogram. Echocardiography is a painless test that uses sound waves to create images of your heart. It provides your doctor with information about the size and shape of your heart and how well your heart's chambers and valves are working. This procedure takes approximately one hour. There are no restrictions for this procedure. October 2017  Your physician has requested that you have a carotid duplex. This test is an ultrasound of the carotid arteries in your neck. It looks at blood flow through these arteries that supply the brain with blood. Allow one hour for this exam. There are no restrictions or special instructions. October 2017    Follow-Up: Your physician wants you to follow-up in: 6 months with Dr. Allyson SabalBerry - after echo and carotids are completed. You will receive a reminder letter in the mail two months in advance. If you don't receive a letter, please call our office to schedule the follow-up appointment.   Any Other Special Instructions Will Be Listed Below (If Applicable).     If you need a refill on your cardiac medications before your next appointment, please call your pharmacy.

## 2016-04-07 NOTE — Assessment & Plan Note (Signed)
History of coronary artery disease status post coronary artery bypass grafting in 2000 with a LIMA to LAD, vein to first and second marginal branch and to the RCA. His last Myoview performed 10/06/11 was low risk and nonischemic. He denies chest pain.

## 2016-04-07 NOTE — Assessment & Plan Note (Signed)
History of hypertension with blood pressure measured today 132/70. He is on amlodipine, benazepril and carvedilol. Continue current meds occur doesn't

## 2016-04-07 NOTE — Assessment & Plan Note (Signed)
History of severe aortic stenosis which has been progressive progressive duplex ultrasound. His most recent echo performed 03/02/16 revealed preserved LV function with a peak gradient of 86 L of mercury valve area of 0.63 cm2, somewhat progressive compared to the last echo. He denies significant dyspnea or chest pain. We talked about the possibility of TAVR  in the future.

## 2016-05-05 DIAGNOSIS — E119 Type 2 diabetes mellitus without complications: Secondary | ICD-10-CM | POA: Diagnosis not present

## 2016-05-05 DIAGNOSIS — H35371 Puckering of macula, right eye: Secondary | ICD-10-CM | POA: Diagnosis not present

## 2016-08-17 DIAGNOSIS — I10 Essential (primary) hypertension: Secondary | ICD-10-CM | POA: Diagnosis not present

## 2016-08-17 DIAGNOSIS — N183 Chronic kidney disease, stage 3 (moderate): Secondary | ICD-10-CM | POA: Diagnosis not present

## 2016-08-17 DIAGNOSIS — I259 Chronic ischemic heart disease, unspecified: Secondary | ICD-10-CM | POA: Diagnosis not present

## 2016-08-17 DIAGNOSIS — Z79899 Other long term (current) drug therapy: Secondary | ICD-10-CM | POA: Diagnosis not present

## 2016-08-17 DIAGNOSIS — Z6826 Body mass index (BMI) 26.0-26.9, adult: Secondary | ICD-10-CM | POA: Diagnosis not present

## 2016-08-17 DIAGNOSIS — E785 Hyperlipidemia, unspecified: Secondary | ICD-10-CM | POA: Diagnosis not present

## 2016-08-17 DIAGNOSIS — E1122 Type 2 diabetes mellitus with diabetic chronic kidney disease: Secondary | ICD-10-CM | POA: Diagnosis not present

## 2016-08-17 DIAGNOSIS — I350 Nonrheumatic aortic (valve) stenosis: Secondary | ICD-10-CM | POA: Diagnosis not present

## 2016-08-17 DIAGNOSIS — D649 Anemia, unspecified: Secondary | ICD-10-CM | POA: Diagnosis not present

## 2016-08-17 DIAGNOSIS — E039 Hypothyroidism, unspecified: Secondary | ICD-10-CM | POA: Diagnosis not present

## 2016-09-09 DIAGNOSIS — Z23 Encounter for immunization: Secondary | ICD-10-CM | POA: Diagnosis not present

## 2016-09-22 ENCOUNTER — Ambulatory Visit
Admission: RE | Admit: 2016-09-22 | Discharge: 2016-09-22 | Disposition: A | Discharge status: Home / Self Care | Payer: Medicare Other | Source: Ambulatory Visit | Attending: Cardiovascular Disease | Admitting: Cardiovascular Disease

## 2016-09-22 ENCOUNTER — Encounter: Payer: Self-pay | Admitting: Cardiovascular Disease

## 2016-09-22 ENCOUNTER — Ambulatory Visit (INDEPENDENT_AMBULATORY_CARE_PROVIDER_SITE_OTHER): Payer: Medicare Other | Admitting: Cardiovascular Disease

## 2016-09-22 ENCOUNTER — Other Ambulatory Visit: Payer: Self-pay | Admitting: *Deleted

## 2016-09-22 VITALS — BP 153/67 | HR 61 | Ht 71.0 in | Wt 173.4 lb

## 2016-09-22 DIAGNOSIS — Z7902 Long term (current) use of antithrombotics/antiplatelets: Secondary | ICD-10-CM

## 2016-09-22 DIAGNOSIS — Z01818 Encounter for other preprocedural examination: Secondary | ICD-10-CM | POA: Diagnosis not present

## 2016-09-22 DIAGNOSIS — I10 Essential (primary) hypertension: Secondary | ICD-10-CM

## 2016-09-22 DIAGNOSIS — E7800 Pure hypercholesterolemia, unspecified: Secondary | ICD-10-CM

## 2016-09-22 DIAGNOSIS — J40 Bronchitis, not specified as acute or chronic: Secondary | ICD-10-CM | POA: Diagnosis not present

## 2016-09-22 DIAGNOSIS — I350 Nonrheumatic aortic (valve) stenosis: Secondary | ICD-10-CM

## 2016-09-22 LAB — CBC WITH DIFFERENTIAL/PLATELET
BASOS PCT: 1 %
Basophils Absolute: 55 cells/uL (ref 0–200)
EOS ABS: 110 {cells}/uL (ref 15–500)
Eosinophils Relative: 2 %
HCT: 38.6 % (ref 38.5–50.0)
Hemoglobin: 12.8 g/dL — ABNORMAL LOW (ref 13.2–17.1)
LYMPHS PCT: 33 %
Lymphs Abs: 1815 cells/uL (ref 850–3900)
MCH: 31 pg (ref 27.0–33.0)
MCHC: 33.2 g/dL (ref 32.0–36.0)
MCV: 93.5 fL (ref 80.0–100.0)
MPV: 10.4 fL (ref 7.5–12.5)
Monocytes Absolute: 605 cells/uL (ref 200–950)
Monocytes Relative: 11 %
NEUTROS PCT: 53 %
Neutro Abs: 2915 cells/uL (ref 1500–7800)
PLATELETS: 193 10*3/uL (ref 140–400)
RBC: 4.13 MIL/uL — ABNORMAL LOW (ref 4.20–5.80)
RDW: 13.6 % (ref 11.0–15.0)
WBC: 5.5 10*3/uL (ref 3.8–10.8)

## 2016-09-22 NOTE — Assessment & Plan Note (Signed)
History of critical aortic stenosis with his most recent echo performed 02/28/16 revealing a valve area of 0.63 cm2 with a peak gradient of 86 mmHg. Since I saw him 6 months ago he has developed progressive symptoms of dyspnea on exertion. Based on this, I decided to proceed with outpatient right and left heart cath to define his anatomy and physiology in anticipation of potential TAVR.

## 2016-09-22 NOTE — Assessment & Plan Note (Signed)
History of hyperlipidemia on statin therapy followed by his PCP 

## 2016-09-22 NOTE — Assessment & Plan Note (Signed)
History of hypertension blood pressure measures 153/67. He is on amlodipine, carvedilol and benazepril. Continue current meds at current dosing

## 2016-09-22 NOTE — Progress Notes (Signed)
09/22/2016 Thomas English   02/09/1932  [id number]  Primary Physician Maryjean Kahristopher Street, MD Primary Cardiologist: Runell GessJonathan J Jiyaan Steinhauser MD Nicholes CalamityFACP, FACC, FAHA, [place]  HPI:  The patient is a very pleasant 82 year old, mildly overweight, married Caucasian male, father of 2, grandfather to 1 grandchild who I last saw 04/07/16. He is accompanied by his wife today. He has a history of CAD status post coronary artery bypass grafting in 2000, with a LIMA to his LAD, a vein graft to the first obtuse marginal branch, second obtuse marginal branch and to the RCA. His other problems include hypertension, hyperlipidemia and noninsulin-requiring diabetes. He does have bilateral internal carotid artery stenosis left greater than right which has remained stable over the last 6 months. He is neurologically asymptomatic. He also has mild aortic stenosis with valve area of 1.4 cm squared. His last Myoview performed October 06, 2011, was low risk. He is asymptomatic. Dr. Sheria Langameron follows his lipid profile. He has had progressive aortic stenosis with his most recent echo performed 02/28/16 were revealing preserved LV function .  He also has moderate bilateral ICA stenosis by duplex ultrasound. Since I saw him in April of this year he's had progressive dyspnea on exertion. I suspect that his aortic stenosis has progressed. We will plan on performing right and left heart cardiac catheterization to define his anatomy and physiology in anticipation of potential TAVR.    Current Outpatient Prescriptions  Medication Sig Dispense Refill  . amLODipine (NORVASC) 10 MG tablet Take 10 mg by mouth every morning.  6  . benazepril (LOTENSIN) 20 MG tablet Take 20 mg by mouth every morning.  6  . carvedilol (COREG) 6.25 MG tablet Take 6.25 mg by mouth 2 (two) times daily with a meal.    . clopidogrel (PLAVIX) 75 MG tablet Take 1 tablet by mouth daily.    Marland Kitchen. levothyroxine (SYNTHROID, LEVOTHROID) 88 MCG tablet Take 1 tablet by mouth daily.   0  . Lutein 6 MG CAPS Take 1 capsule by mouth daily.    . metFORMIN (GLUCOPHAGE) 500 MG tablet Take 500 mg by mouth daily with breakfast.    . simvastatin (ZOCOR) 20 MG tablet Take 1 tablet by mouth daily.     No current facility-administered medications for this visit.     No Known Allergies  Social History   Social History  . Marital status: Married    Spouse name: N/A  . Number of children: 2  . Years of education: GED   Occupational History  . Not on file.   Social History Main Topics  . Smoking status: Former Smoker    Packs/day: 1.00    Years: 5.00    Types: Cigarettes    Quit date: 07/20/1973  . Smokeless tobacco: Never Used  . Alcohol use No  . Drug use: No  . Sexual activity: Not on file   Other Topics Concern  . Not on file   Social History Narrative  . No narrative on file     Review of Systems: General: negative for chills, fever, night sweats or weight changes.  Cardiovascular: negative for chest pain, dyspnea on exertion, edema, orthopnea, palpitations, paroxysmal nocturnal dyspnea or shortness of breath Dermatological: negative for rash Respiratory: negative for cough or wheezing Urologic: negative for hematuria Abdominal: negative for nausea, vomiting, diarrhea, bright red blood per rectum, melena, or hematemesis Neurologic: negative for visual changes, syncope, or dizziness All other systems reviewed and are otherwise negative except as noted above.  Blood pressure (!) 153/67, pulse 61, height 5\' 11"  (1.803 m), weight 173 lb 6.4 oz (78.7 kg), SpO2 100 %.  General appearance: alert and no distress Neck: no adenopathy, no JVD, supple, symmetrical, trachea midline, thyroid not enlarged, symmetric, no tenderness/mass/nodules and Bilateral carotid bruits Lungs: clear to auscultation bilaterally Heart: 3/6 harsh systolic ejection murmur at the base consistent with severe aortic stenosis Extremities: extremities normal, atraumatic, no cyanosis or  edema  EKG normal sinus rhythm at 61 without ST or T-wave changes. I personally reviewed this EKG  ASSESSMENT AND PLAN:   Carotid stenosis, bil, last dopplers 12/2013 50-69% History of moderate bilateral ICA stenosis by duplex ultrasound performed 03/03/16.  Aortic stenosis History of critical aortic stenosis with his most recent echo performed 02/28/16 revealing a valve area of 0.63 cm2 with a peak gradient of 86 mmHg. Since I saw him 6 months ago he has developed progressive symptoms of dyspnea on exertion. Based on this, I decided to proceed with outpatient right and left heart cath to define his anatomy and physiology in anticipation of potential TAVR.  Coronary artery disease: hx CABG 2000, LIMA-LAD;VG-1st & 2nd OM of LCX VG-RCA, LOW RISK NUC 2012 History of CAD status post bypass grafting in 2000 the LIMA to his LAD, vein to the first and second obtuse marginal branches and to the RCA. His last Myoview stress test performed 10/06/11 was nonischemic. He denies chest pain but has had progressive dyspnea on exertion most likely related to regression of his aortic stenosis. My plan is to perform perform right and left heart cath to define his anatomy and physiology.  Essential hypertension History of hypertension blood pressure measures 153/67. He is on amlodipine, carvedilol and benazepril. Continue current meds at current dosing  Hyperlipidemia History of hyperlipidemia on statin therapy followed by his PCP      Runell Gess MD [facility], [facility] 09/22/2016 9:42 AM

## 2016-09-22 NOTE — Assessment & Plan Note (Signed)
History of moderate bilateral ICA stenosis by duplex ultrasound performed 03/03/16.

## 2016-09-22 NOTE — Assessment & Plan Note (Signed)
History of CAD status post bypass grafting in 2000 the LIMA to his LAD, vein to the first and second obtuse marginal branches and to the RCA. His last Myoview stress test performed 10/06/11 was nonischemic. He denies chest pain but has had progressive dyspnea on exertion most likely related to regression of his aortic stenosis. My plan is to perform perform right and left heart cath to define his anatomy and physiology.

## 2016-09-22 NOTE — Patient Instructions (Addendum)
Medication Instructions:  NO CHANGES.   Testing/Procedures: Your physician has requested that you have an echocardiogram. Echocardiography is a painless test that uses sound waves to create images of your heart. It provides your doctor with information about the size and shape of your heart and how well your heart's chambers and valves are working. This procedure takes approximately one hour. There are no restrictions for this procedure. PRIOR TO RIGHT/LEFT HEART CATH.   Your physician has requested that you have a RIGHT AND LEFT cardiac catheterization. Cardiac catheterization is used to diagnose and/or treat various heart conditions. Doctors may recommend this procedure for a number of different reasons. The most common reason is to evaluate chest pain. Chest pain can be a symptom of coronary artery disease (CAD), and cardiac catheterization can show whether plaque is narrowing or blocking your heart's arteries. This procedure is also used to evaluate the valves, as well as measure the blood flow and oxygen levels in different parts of your heart. For further information please visit [email or web address].   Following your catheterization, you will not be allowed to drive for 3 days.  No lifting, pushing, or pulling greater that 10 pounds is allowed for 1 week.  You will be required to have the following tests prior to the procedure:  1. Blood work-the blood work can be done no more than 14 days prior to the procedure.  [facility].  There is one downstairs on the first floor of this building and one in the [facility] building [phone]([address]).  2. Chest Xray-[facility].      Puncture site  RIGHT GROIN  If you need a refill on your cardiac medications before your next appointment, please call your pharmacy.

## 2016-09-23 LAB — APTT: APTT: 31 s (ref 22–34)

## 2016-09-23 LAB — BASIC METABOLIC PANEL
BUN: 15 mg/dL (ref 7–25)
CALCIUM: 9.2 mg/dL (ref 8.6–10.3)
CO2: 26 mmol/L (ref 20–31)
CREATININE: 1.32 mg/dL — AB (ref 0.70–1.11)
Chloride: 103 mmol/L (ref 98–110)
Glucose, Bld: 89 mg/dL (ref 65–99)
Potassium: 4.9 mmol/L (ref 3.5–5.3)
Sodium: 137 mmol/L (ref 135–146)

## 2016-09-23 LAB — PROTIME-INR
INR: 1.1
PROTHROMBIN TIME: 11.2 s (ref 9.0–11.5)

## 2016-10-02 ENCOUNTER — Ambulatory Visit (HOSPITAL_COMMUNITY): Payer: Medicare Other | Attending: Cardiology

## 2016-10-02 ENCOUNTER — Other Ambulatory Visit: Payer: Self-pay

## 2016-10-02 DIAGNOSIS — Z7902 Long term (current) use of antithrombotics/antiplatelets: Secondary | ICD-10-CM

## 2016-10-02 DIAGNOSIS — Z951 Presence of aortocoronary bypass graft: Secondary | ICD-10-CM | POA: Insufficient documentation

## 2016-10-02 DIAGNOSIS — E785 Hyperlipidemia, unspecified: Secondary | ICD-10-CM | POA: Diagnosis not present

## 2016-10-02 DIAGNOSIS — I119 Hypertensive heart disease without heart failure: Secondary | ICD-10-CM | POA: Diagnosis not present

## 2016-10-02 DIAGNOSIS — I2510 Atherosclerotic heart disease of native coronary artery without angina pectoris: Secondary | ICD-10-CM | POA: Insufficient documentation

## 2016-10-02 DIAGNOSIS — Z01818 Encounter for other preprocedural examination: Secondary | ICD-10-CM | POA: Diagnosis not present

## 2016-10-02 DIAGNOSIS — I059 Rheumatic mitral valve disease, unspecified: Secondary | ICD-10-CM | POA: Insufficient documentation

## 2016-10-02 DIAGNOSIS — I352 Nonrheumatic aortic (valve) stenosis with insufficiency: Secondary | ICD-10-CM | POA: Diagnosis not present

## 2016-10-02 DIAGNOSIS — I350 Nonrheumatic aortic (valve) stenosis: Secondary | ICD-10-CM

## 2016-10-02 DIAGNOSIS — Z87891 Personal history of nicotine dependence: Secondary | ICD-10-CM | POA: Insufficient documentation

## 2016-10-05 ENCOUNTER — Encounter (HOSPITAL_COMMUNITY)
Admission: RE | Disposition: A | Discharge status: Home / Self Care | Payer: Self-pay | Source: Ambulatory Visit | Attending: Cardiovascular Disease

## 2016-10-05 ENCOUNTER — Ambulatory Visit (HOSPITAL_COMMUNITY)
Admission: RE | Admit: 2016-10-05 | Discharge: 2016-10-06 | Disposition: A | Discharge status: Home / Self Care | Payer: Medicare Other | Source: Ambulatory Visit | Attending: Cardiovascular Disease | Admitting: Cardiovascular Disease

## 2016-10-05 DIAGNOSIS — I10 Essential (primary) hypertension: Secondary | ICD-10-CM | POA: Diagnosis not present

## 2016-10-05 DIAGNOSIS — E119 Type 2 diabetes mellitus without complications: Secondary | ICD-10-CM | POA: Insufficient documentation

## 2016-10-05 DIAGNOSIS — I350 Nonrheumatic aortic (valve) stenosis: Secondary | ICD-10-CM | POA: Insufficient documentation

## 2016-10-05 DIAGNOSIS — Z7902 Long term (current) use of antithrombotics/antiplatelets: Secondary | ICD-10-CM | POA: Diagnosis not present

## 2016-10-05 DIAGNOSIS — I2510 Atherosclerotic heart disease of native coronary artery without angina pectoris: Secondary | ICD-10-CM | POA: Diagnosis not present

## 2016-10-05 DIAGNOSIS — Z87891 Personal history of nicotine dependence: Secondary | ICD-10-CM | POA: Diagnosis not present

## 2016-10-05 DIAGNOSIS — Z951 Presence of aortocoronary bypass graft: Secondary | ICD-10-CM | POA: Insufficient documentation

## 2016-10-05 DIAGNOSIS — Z7982 Long term (current) use of aspirin: Secondary | ICD-10-CM | POA: Diagnosis not present

## 2016-10-05 DIAGNOSIS — Z79899 Other long term (current) drug therapy: Secondary | ICD-10-CM | POA: Insufficient documentation

## 2016-10-05 DIAGNOSIS — Z8249 Family history of ischemic heart disease and other diseases of the circulatory system: Secondary | ICD-10-CM | POA: Diagnosis not present

## 2016-10-05 DIAGNOSIS — E785 Hyperlipidemia, unspecified: Secondary | ICD-10-CM | POA: Diagnosis present

## 2016-10-05 DIAGNOSIS — I6523 Occlusion and stenosis of bilateral carotid arteries: Secondary | ICD-10-CM | POA: Insufficient documentation

## 2016-10-05 DIAGNOSIS — Z7984 Long term (current) use of oral hypoglycemic drugs: Secondary | ICD-10-CM | POA: Insufficient documentation

## 2016-10-05 DIAGNOSIS — I2583 Coronary atherosclerosis due to lipid rich plaque: Secondary | ICD-10-CM | POA: Diagnosis not present

## 2016-10-05 DIAGNOSIS — I6529 Occlusion and stenosis of unspecified carotid artery: Secondary | ICD-10-CM | POA: Diagnosis present

## 2016-10-05 HISTORY — PX: PERIPHERAL VASCULAR CATHETERIZATION: SHX172C

## 2016-10-05 HISTORY — DX: Unspecified osteoarthritis, unspecified site: M19.90

## 2016-10-05 HISTORY — PX: CARDIAC CATHETERIZATION: SHX172

## 2016-10-05 LAB — POCT I-STAT 3, VENOUS BLOOD GAS (G3P V)
Bicarbonate: 23.3 mmol/L (ref 20.0–28.0)
O2 Saturation: 70 %
TCO2: 24 mmol/L (ref 0–100)
pCO2, Ven: 33 mmHg — ABNORMAL LOW (ref 44.0–60.0)
pH, Ven: 7.457 — ABNORMAL HIGH (ref 7.250–7.430)
pO2, Ven: 34 mmHg (ref 32.0–45.0)

## 2016-10-05 LAB — POCT I-STAT 3, ART BLOOD GAS (G3+)
ACID-BASE DEFICIT: 3 mmol/L — AB (ref 0.0–2.0)
BICARBONATE: 21.1 mmol/L (ref 20.0–28.0)
O2 Saturation: 97 %
TCO2: 22 mmol/L (ref 0–100)
pCO2 arterial: 32.4 mmHg (ref 32.0–48.0)
pH, Arterial: 7.423 (ref 7.350–7.450)
pO2, Arterial: 88 mmHg (ref 83.0–108.0)

## 2016-10-05 LAB — GLUCOSE, CAPILLARY
Glucose-Capillary: 123 mg/dL — ABNORMAL HIGH (ref 65–99)
Glucose-Capillary: 104 mg/dL — ABNORMAL HIGH (ref 65–99)
Glucose-Capillary: 146 mg/dL — ABNORMAL HIGH (ref 65–99)

## 2016-10-05 SURGERY — RIGHT/LEFT HEART CATH AND CORONARY/GRAFT ANGIOGRAPHY

## 2016-10-05 MED ORDER — BENAZEPRIL HCL 20 MG PO TABS
20.0000 mg | ORAL_TABLET | Freq: Every day | ORAL | Status: DC
  Administered 2016-10-06: 20 mg via ORAL
  Filled 2016-10-05: qty 1

## 2016-10-05 MED ORDER — SODIUM CHLORIDE 0.9 % IV SOLN
INTRAVENOUS | Status: AC
  Administered 2016-10-05: 17:00:00 via INTRAVENOUS

## 2016-10-05 MED ORDER — CLOPIDOGREL BISULFATE 75 MG PO TABS
75.0000 mg | ORAL_TABLET | Freq: Every day | ORAL | Status: DC
  Administered 2016-10-06: 75 mg via ORAL
  Filled 2016-10-05: qty 1

## 2016-10-05 MED ORDER — SIMVASTATIN 20 MG PO TABS
20.0000 mg | ORAL_TABLET | Freq: Every evening | ORAL | Status: DC
  Administered 2016-10-05: 19:00:00 20 mg via ORAL
  Filled 2016-10-05: qty 1

## 2016-10-05 MED ORDER — LEVOTHYROXINE SODIUM 88 MCG PO TABS
88.0000 ug | ORAL_TABLET | Freq: Every evening | ORAL | Status: DC
  Administered 2016-10-06: 88 ug via ORAL
  Filled 2016-10-05: qty 1

## 2016-10-05 MED ORDER — SODIUM CHLORIDE 0.9% FLUSH: 3.0000 mL | INTRAVENOUS | Status: DC | PRN

## 2016-10-05 MED ORDER — IOPAMIDOL (ISOVUE-370) INJECTION 76%
INTRAVENOUS | Status: AC
  Filled 2016-10-05: qty 200

## 2016-10-05 MED ORDER — LIDOCAINE HCL (PF) 1 % IJ SOLN
INTRAMUSCULAR | Status: DC | PRN
  Administered 2016-10-05: 20 mL via SUBCUTANEOUS

## 2016-10-05 MED ORDER — HYDRALAZINE HCL 20 MG/ML IJ SOLN
10.0000 mg | INTRAMUSCULAR | Status: DC | PRN
Start: 2016-10-05 — End: 2016-10-06

## 2016-10-05 MED ORDER — ASPIRIN 81 MG PO CHEW
81.0000 mg | CHEWABLE_TABLET | Freq: Every day | ORAL | Status: DC
  Filled 2016-10-05: qty 1

## 2016-10-05 MED ORDER — SODIUM CHLORIDE 0.9 % WEIGHT BASED INFUSION
3.0000 mL/kg/h | INTRAVENOUS | Status: DC
  Administered 2016-10-05: 3 mL/kg/h via INTRAVENOUS

## 2016-10-05 MED ORDER — ASPIRIN 81 MG PO CHEW
CHEWABLE_TABLET | ORAL | Status: AC
  Administered 2016-10-05: 81 mg via ORAL
  Filled 2016-10-05: qty 1

## 2016-10-05 MED ORDER — ACETAMINOPHEN 325 MG PO TABS: 650.0000 mg | ORAL_TABLET | ORAL | Status: DC | PRN

## 2016-10-05 MED ORDER — HEPARIN (PORCINE) IN NACL 2-0.9 UNIT/ML-% IJ SOLN
INTRAMUSCULAR | Status: DC | PRN
  Administered 2016-10-05: 16:00:00

## 2016-10-05 MED ORDER — LIDOCAINE HCL (PF) 1 % IJ SOLN
INTRAMUSCULAR | Status: AC
  Filled 2016-10-05: qty 30

## 2016-10-05 MED ORDER — ASPIRIN 81 MG PO CHEW
81.0000 mg | CHEWABLE_TABLET | ORAL | Status: AC
  Administered 2016-10-05: 81 mg via ORAL

## 2016-10-05 MED ORDER — IOPAMIDOL (ISOVUE-370) INJECTION 76%
INTRAVENOUS | Status: DC | PRN
  Administered 2016-10-05: 130 mL

## 2016-10-05 MED ORDER — AMLODIPINE BESYLATE 10 MG PO TABS
10.0000 mg | ORAL_TABLET | Freq: Every day | ORAL | Status: DC
Start: 2016-10-06 — End: 2016-10-06
  Administered 2016-10-06: 09:00:00 10 mg via ORAL
  Filled 2016-10-05: qty 1

## 2016-10-05 MED ORDER — CARVEDILOL 6.25 MG PO TABS
6.2500 mg | ORAL_TABLET | Freq: Two times a day (BID) | ORAL | Status: DC
  Administered 2016-10-05 – 2016-10-06 (×2): 6.25 mg via ORAL
  Filled 2016-10-05: qty 1
  Filled 2016-10-05: qty 2
  Filled 2016-10-05 (×2): qty 1

## 2016-10-05 MED ORDER — HEPARIN (PORCINE) IN NACL 2-0.9 UNIT/ML-% IJ SOLN
INTRAMUSCULAR | Status: AC
  Filled 2016-10-05: qty 1000

## 2016-10-05 MED ORDER — MORPHINE SULFATE (PF) 2 MG/ML IV SOLN: 2.0000 mg | INTRAVENOUS | Status: DC | PRN

## 2016-10-05 MED ORDER — SODIUM CHLORIDE 0.9 % IV SOLN: 250.0000 mL | INTRAVENOUS | Status: DC | PRN

## 2016-10-05 MED ORDER — ONDANSETRON HCL 4 MG/2ML IJ SOLN: 4.0000 mg | Freq: Four times a day (QID) | INTRAMUSCULAR | Status: DC | PRN

## 2016-10-05 MED ORDER — LUTEIN 6 MG PO CAPS: 6.0000 mg | ORAL_CAPSULE | Freq: Every day | ORAL | Status: DC

## 2016-10-05 MED ORDER — SODIUM CHLORIDE 0.9% FLUSH
3.0000 mL | Freq: Two times a day (BID) | INTRAVENOUS | Status: DC
  Administered 2016-10-05: 3 mL via INTRAVENOUS

## 2016-10-05 MED ORDER — ANGIOPLASTY BOOK
Freq: Once | Status: AC
  Administered 2016-10-06: 04:00:00
  Filled 2016-10-05: qty 1

## 2016-10-05 MED ORDER — SODIUM CHLORIDE 0.9 % WEIGHT BASED INFUSION: 1.0000 mL/kg/h | INTRAVENOUS | Status: DC

## 2016-10-05 SURGICAL SUPPLY — 11 items
CATH INFINITI 5FR MULTPACK ANG (CATHETERS) ×1 IMPLANT
CATH SWAN GANZ 7F STRAIGHT (CATHETERS) ×1 IMPLANT
KIT HEART LEFT (KITS) ×2 IMPLANT
KIT HEART RIGHT NAMIC (KITS) ×2 IMPLANT
PACK CARDIAC CATHETERIZATION (CUSTOM PROCEDURE TRAY) ×2 IMPLANT
SHEATH PINNACLE 5F 10CM (SHEATH) ×1 IMPLANT
SHEATH PINNACLE 7F 10CM (SHEATH) ×1 IMPLANT
SYR MEDRAD MARK V 150ML (SYRINGE) ×2 IMPLANT
TRANSDUCER W/STOPCOCK (MISCELLANEOUS) ×4 IMPLANT
WIRE EMERALD 3MM-J .035X150CM (WIRE) ×1 IMPLANT
WIRE EMERALD ST .035X150CM (WIRE) ×1 IMPLANT

## 2016-10-05 NOTE — Care Management Note (Signed)
Case Management Note  Patient Details  Name: Thomas English MRN: [id number] Date of Birth: 02/07/1932  Subjective/Objective:     S/p  Right/Left Heart Cath and Coronary/Graft Angiography , NCM will cont to follow for dc needs.                Action/Plan:   Expected Discharge Date:                  Expected Discharge Plan:  Home w Home Health Services  In-House Referral:     Discharge planning Services  CM Consult  Post Acute Care Choice:    Choice offered to:     DME Arranged:    DME Agency:     HH Arranged:    HH Agency:     Status of Service:  In process, will continue to follow  If discussed at Long Length of Stay Meetings, dates discussed:    Additional Comments:  Leone Havenaylor, Deissy Guilbert Clinton, RN 10/05/2016, 5:45 PM

## 2016-10-05 NOTE — Progress Notes (Signed)
Rt venous and rt aterial sheath removed and pressure held for 20 minutes. Level 0. Tegaderm dressing applied. Pt restriction for sheath pulled done. Vital signs remain stable

## 2016-10-05 NOTE — H&P (View-Only) (Signed)
09/22/2016 Thomas English   07/23/1932  [id number]  Primary Physician Maryjean Kahristopher Street, MD Primary Cardiologist: Runell GessJonathan J Josephus Harriger MD Nicholes CalamityFACP, FACC, FAHA, [place]  HPI:  The patient is a very pleasant 82 year old, mildly overweight, married Caucasian male, father of 2, grandfather to 1 grandchild who I last saw 04/07/16. He is accompanied by his wife today. He has a history of CAD status post coronary artery bypass grafting in 2000, with a LIMA to his LAD, a vein graft to the first obtuse marginal branch, second obtuse marginal branch and to the RCA. His other problems include hypertension, hyperlipidemia and noninsulin-requiring diabetes. He does have bilateral internal carotid artery stenosis left greater than right which has remained stable over the last 6 months. He is neurologically asymptomatic. He also has mild aortic stenosis with valve area of 1.4 cm squared. His last Myoview performed October 06, 2011, was low risk. He is asymptomatic. Dr. Sheria Langameron follows his lipid profile. He has had progressive aortic stenosis with his most recent echo performed 02/28/16 were revealing preserved LV function .  He also has moderate bilateral ICA stenosis by duplex ultrasound. Since I saw him in April of this year he's had progressive dyspnea on exertion. I suspect that his aortic stenosis has progressed. We will plan on performing right and left heart cardiac catheterization to define his anatomy and physiology in anticipation of potential TAVR.    Current Outpatient Prescriptions  Medication Sig Dispense Refill  . amLODipine (NORVASC) 10 MG tablet Take 10 mg by mouth every morning.  6  . benazepril (LOTENSIN) 20 MG tablet Take 20 mg by mouth every morning.  6  . carvedilol (COREG) 6.25 MG tablet Take 6.25 mg by mouth 2 (two) times daily with a meal.    . clopidogrel (PLAVIX) 75 MG tablet Take 1 tablet by mouth daily.    Marland Kitchen. levothyroxine (SYNTHROID, LEVOTHROID) 88 MCG tablet Take 1 tablet by mouth daily.   0  . Lutein 6 MG CAPS Take 1 capsule by mouth daily.    . metFORMIN (GLUCOPHAGE) 500 MG tablet Take 500 mg by mouth daily with breakfast.    . simvastatin (ZOCOR) 20 MG tablet Take 1 tablet by mouth daily.     No current facility-administered medications for this visit.     No Known Allergies  Social History   Social History  . Marital status: Married    Spouse name: N/A  . Number of children: 2  . Years of education: GED   Occupational History  . Not on file.   Social History Main Topics  . Smoking status: Former Smoker    Packs/day: 1.00    Years: 5.00    Types: Cigarettes    Quit date: 07/20/1973  . Smokeless tobacco: Never Used  . Alcohol use No  . Drug use: No  . Sexual activity: Not on file   Other Topics Concern  . Not on file   Social History Narrative  . No narrative on file     Review of Systems: General: negative for chills, fever, night sweats or weight changes.  Cardiovascular: negative for chest pain, dyspnea on exertion, edema, orthopnea, palpitations, paroxysmal nocturnal dyspnea or shortness of breath Dermatological: negative for rash Respiratory: negative for cough or wheezing Urologic: negative for hematuria Abdominal: negative for nausea, vomiting, diarrhea, bright red blood per rectum, melena, or hematemesis Neurologic: negative for visual changes, syncope, or dizziness All other systems reviewed and are otherwise negative except as noted above.  Blood pressure (!) 153/67, pulse 61, height 5\' 11"  (1.803 m), weight 173 lb 6.4 oz (78.7 kg), SpO2 100 %.  General appearance: alert and no distress Neck: no adenopathy, no JVD, supple, symmetrical, trachea midline, thyroid not enlarged, symmetric, no tenderness/mass/nodules and Bilateral carotid bruits Lungs: clear to auscultation bilaterally Heart: 3/6 harsh systolic ejection murmur at the base consistent with severe aortic stenosis Extremities: extremities normal, atraumatic, no cyanosis or  edema  EKG normal sinus rhythm at 61 without ST or T-wave changes. I personally reviewed this EKG  ASSESSMENT AND PLAN:   Carotid stenosis, bil, last dopplers 12/2013 50-69% History of moderate bilateral ICA stenosis by duplex ultrasound performed 03/03/16.  Aortic stenosis History of critical aortic stenosis with his most recent echo performed 02/28/16 revealing a valve area of 0.63 cm2 with a peak gradient of 86 mmHg. Since I saw him 6 months ago he has developed progressive symptoms of dyspnea on exertion. Based on this, I decided to proceed with outpatient right and left heart cath to define his anatomy and physiology in anticipation of potential TAVR.  Coronary artery disease: hx CABG 2000, LIMA-LAD;VG-1st & 2nd OM of LCX VG-RCA, LOW RISK NUC 2012 History of CAD status post bypass grafting in 2000 the LIMA to his LAD, vein to the first and second obtuse marginal branches and to the RCA. His last Myoview stress test performed 10/06/11 was nonischemic. He denies chest pain but has had progressive dyspnea on exertion most likely related to regression of his aortic stenosis. My plan is to perform perform right and left heart cath to define his anatomy and physiology.  Essential hypertension History of hypertension blood pressure measures 153/67. He is on amlodipine, carvedilol and benazepril. Continue current meds at current dosing  Hyperlipidemia History of hyperlipidemia on statin therapy followed by his PCP      Runell Gess MD [facility], [facility] 09/22/2016 9:42 AM

## 2016-10-05 NOTE — Interval H&P Note (Signed)
Cath Lab Visit (complete for each Cath Lab visit)  Clinical Evaluation Leading to the Procedure:   ACS: No.  Non-ACS:    Anginal Classification: CCS III  Anti-ischemic medical therapy: Minimal Therapy (1 class of medications)  Non-Invasive Test Results: No non-invasive testing performed  Prior CABG: No previous CABG      History and Physical Interval Note:  10/05/2016 3:02 PM  Thomas English  has presented today for surgery, with the diagnosis of aortic stenosis  The various methods of treatment have been discussed with the patient and family. After consideration of risks, benefits and other options for treatment, the patient has consented to  Procedure(s): Right/Left Heart Cath and Coronary/Graft Angiography (N/A) as a surgical intervention .  The patient's history has been reviewed, patient examined, no change in status, stable for surgery.  I have reviewed the patient's chart and labs.  Questions were answered to the patient's satisfaction.     Nanetta BattyBerry, Yoanna Jurczyk

## 2016-10-06 ENCOUNTER — Encounter (HOSPITAL_COMMUNITY): Payer: Self-pay | Admitting: Cardiovascular Disease

## 2016-10-06 ENCOUNTER — Other Ambulatory Visit: Payer: Self-pay | Admitting: *Deleted

## 2016-10-06 DIAGNOSIS — I350 Nonrheumatic aortic (valve) stenosis: Secondary | ICD-10-CM

## 2016-10-06 DIAGNOSIS — I2510 Atherosclerotic heart disease of native coronary artery without angina pectoris: Secondary | ICD-10-CM | POA: Diagnosis not present

## 2016-10-06 LAB — CBC
HEMATOCRIT: 31.9 % — AB (ref 39.0–52.0)
Hemoglobin: 10.7 g/dL — ABNORMAL LOW (ref 13.0–17.0)
MCH: 30.9 pg (ref 26.0–34.0)
MCHC: 33.5 g/dL (ref 30.0–36.0)
MCV: 92.2 fL (ref 78.0–100.0)
PLATELETS: 169 10*3/uL (ref 150–400)
RBC: 3.46 MIL/uL — ABNORMAL LOW (ref 4.22–5.81)
RDW: 13.4 % (ref 11.5–15.5)
WBC: 4.9 10*3/uL (ref 4.0–10.5)

## 2016-10-06 LAB — BASIC METABOLIC PANEL
Anion gap: 7 (ref 5–15)
BUN: 15 mg/dL (ref 6–20)
CALCIUM: 8.4 mg/dL — AB (ref 8.9–10.3)
CO2: 24 mmol/L (ref 22–32)
CREATININE: 1.22 mg/dL (ref 0.61–1.24)
Chloride: 109 mmol/L (ref 101–111)
GFR calc Af Amer: >60 mL/min (ref >60)
GFR, EST NON AFRICAN AMERICAN: 54 mL/min — AB (ref >60)
GLUCOSE: 88 mg/dL (ref 65–99)
Potassium: 4.1 mmol/L (ref 3.5–5.1)
Sodium: 140 mmol/L (ref 135–145)

## 2016-10-06 NOTE — Discharge Instructions (Signed)

## 2016-10-06 NOTE — Consult Note (Signed)
CARDIOLOGY CONSULT NOTE  Patient ID: Doyce LooseOdell C Gaster, MRN: [id number], DOB/AGE: 80/03/1932 80 y.o. Admit date: 10/05/2016 Date of Consult: 10/06/2016  Primary Physician: Maryjean Kahristopher Street, MD Primary Cardiologist: Dr Allyson SabalBerry Referring Physician: Dr Allyson SabalBerry  Chief Complaint: Shortness of breath  Reason for Consultation: Severe aortic stenosis  HPI: Kathrine HaddockOdell Buczek is an 82 yo male who underwent outpatient cardiac catheterization yesterday for progressive exertional dyspnea thought to be secondary to severe aortic stenosis. The patient has been followed since undergoing CABG 17 years ago by Dr Laneta SimmersBartle. At that time he presented with symptoms of exertional dyspnea and was found to have multivessel coronary artery disease treated with CABG. He has done relatively well until the past 3-6 months when he has developed symptoms of exertional dyspnea. The patient is short of breath whenever he "gets in a hurry." He can do normal activities around the house and walk slowly without any symptoms. He denies chest pain, heart palpitations, dizziness, or syncope. He has no orthopnea or PND.  The patient was recently seen by Dr. Allyson SabalBerry after undergoing an echocardiogram which demonstrated critical aortic stenosis. At the time of his office evaluation he admitted to worsening symptoms of shortness of breath and he underwent cardiac catheterization yesterday. His heart catheterization demonstrated severe native vessel coronary artery disease with continued patency of his bypass grafts to the LAD, OM branches of the circumflex, and right coronary artery distributions.  The patient is interviewed today with his wife and son-in-law at the bedside. He is functionally independent, continues to do some light work in his garden, and states that [facility] minutes as long as he goes at a slow pace. He has been followed for carotid artery stenosis but has no history of stroke or TIA. He has been  treated for diabetes with oral hypoglycemics. He's had previous back surgery in addition to his coronary bypass surgery.  Medical History:  Past Medical History:  Diagnosis Date  . Aortic stenosis   . CAD (coronary artery disease)   . Carotid stenosis    bilateral internal, left greater than right, stable  . Diabetes mellitus type 2, noninsulin dependent (HCC)   . Hyperlipidemia   . Hypertension   . S/P CABG (coronary artery bypass graft) 10/1999   LIMA to LAD and a vein graft to the first OM branch, second OM branch, and to the RCA      Surgical History:  Past Surgical History:  Procedure Laterality Date  . CARDIAC CATHETERIZATION  11/10/1999   3-vessel disease, recommendation for CABG  . CARDIAC CATHETERIZATION N/A 10/05/2016   Procedure: Right/Left Heart Cath and Coronary/Graft Angiography;  Surgeon: Runell GessJonathan J Berry, MD;  [facility];  Service: Cardiovascular;  Laterality: N/A;  . CORONARY ARTERY BYPASS GRAFT  11/13/1999   LIMA to LAD and a vein graft to the first OM branch, second OM branch, and to the RCA (Dr. Wayland SalinasB. Bartle)  . NM MYOCAR PERF WALL MOTION  09/2013   normal lexiscan myoview  . PERIPHERAL VASCULAR CATHETERIZATION N/A 10/05/2016   Procedure: Abdominal Aortogram;  Surgeon: Runell GessJonathan J Berry, MD;  [facility];  Service: Cardiovascular;  Laterality: N/A;  . TRANSTHORACIC ECHOCARDIOGRAM  12/2013   EF 55-60%, grade 1 diastolic dysfunction, high ventricular filling pressure; severe AV stenosis; calcified MV annulus; LA & RA mildly dilated; atrial septal aneurysm     Home Meds: Prior to Admission medications   Medication Sig Start  Date End Date Taking? Authorizing Provider  amLODipine (NORVASC) 10 MG tablet Take 10 mg by mouth daily with breakfast.  04/20/15  Yes Historical Provider, MD  benazepril (LOTENSIN) 20 MG tablet Take 20 mg by mouth daily with breakfast.  04/18/15  Yes Historical Provider, MD  carvedilol (COREG) 6.25 MG tablet Take  6.25 mg by mouth 2 (two) times daily with a meal.   Yes Historical Provider, MD  clopidogrel (PLAVIX) 75 MG tablet Take 75 mg by mouth daily.  05/19/13  Yes Historical Provider, MD  levothyroxine (SYNTHROID, LEVOTHROID) 88 MCG tablet Take 88 mcg by mouth every evening.  08/18/16  Yes Historical Provider, MD  Lutein 6 MG CAPS Take 6 mg by mouth daily with breakfast.    Yes Historical Provider, MD  metFORMIN (GLUCOPHAGE) 500 MG tablet Take 500 mg by mouth daily with breakfast.   Yes Historical Provider, MD  simvastatin (ZOCOR) 20 MG tablet Take 20 mg by mouth every evening.  05/19/13  Yes Historical Provider, MD    Inpatient Medications:  . amLODipine  10 mg Oral Q breakfast  . aspirin  81 mg Oral Daily  . benazepril  20 mg Oral Q breakfast  . carvedilol  6.25 mg Oral BID WC  . clopidogrel  75 mg Oral Daily  . levothyroxine  88 mcg Oral QPM  . simvastatin  20 mg Oral QPM  . sodium chloride flush  3 mL Intravenous Q12H     Allergies: No Known Allergies  Social History   Social History  . Marital status: Married    Spouse name: N/A  . Number of children: 2  . Years of education: GED   Occupational History  . Not on file.   Social History Main Topics  . Smoking status: Former Smoker    Packs/day: 1.00    Years: 5.00    Types: Cigarettes    Quit date: 07/20/1973  . Smokeless tobacco: Never Used  . Alcohol use No  . Drug use: No  . Sexual activity: Not on file   Other Topics Concern  . Not on file   Social History Narrative  . No narrative on file     Family History  Problem Relation Age of Onset  . Lung disease Mother   . Colon cancer Father   . Diabetes Sister   . CAD Sister      Review of Systems: General: negative for chills, fever, night sweats or weight changes.  ENT: negative for rhinorrhea or epistaxis Cardiovascular: see HPI Dermatological: negative for rash Respiratory: negative for cough or wheezing GI: negative for nausea, vomiting, diarrhea, bright  red blood per rectum, melena, or hematemesis GU: no hematuria, urgency, or frequency Neurologic: negative for visual changes, syncope, headache, or dizziness Heme: no easy bruising or bleeding Endo: negative for excessive thirst, thyroid disorder, or flushing Musculoskeletal: negative for joint pain or swelling, negative for myalgias  All other systems reviewed and are otherwise negative except as noted above.  Physical Exam: Blood pressure (!) 136/56, pulse 69, temperature 97.8 F (36.6 C), temperature source Oral, resp. rate 18, height 5\' 11"  (1.803 m), weight 79 kg (174 lb 2.6 oz), SpO2 97 %. Pt is alert and oriented, WD, WN, in no distress. HEENT: normal Neck: JVP normal. Carotid upstrokes delayed and diminished with bilateral bruits. No thyromegaly. Lungs: equal expansion, clear bilaterally CV: Apex is discrete and nondisplaced, RRR with a grade 3/6 late-peaking crescendo decrescendo murmur at the right upper sternal border. A2 is absent. There  is no diastolic murmur. Abd: soft, NT, +BS, no bruit, no hepatosplenomegaly Back: no CVA tenderness Ext: no C/C/E, the right groin access site is clear with no evidence of hematoma or ecchymosis.        DP/PT pulses intact and = Skin: warm and dry without rash Neuro: CNII-XII intact             Strength intact = bilaterally    Labs: No results for input(s): CKTOTAL, CKMB, TROPONINI in the last 72 hours. Lab Results  Component Value Date   WBC 4.9 10/06/2016   HGB 10.7 (L) 10/06/2016   HCT 31.9 (L) 10/06/2016   MCV 92.2 10/06/2016   PLT 169 10/06/2016    Recent Labs Lab 10/06/16 0412  NA 140  K 4.1  CL 109  CO2 24  BUN 15  CREATININE 1.22  CALCIUM 8.4*  GLUCOSE 88   No results found for: CHOL, HDL, LDLCALC, TRIG No results found for: DDIMER  Radiology/Studies:  Dg Chest 2 View  Result Date: 09/22/2016 CLINICAL DATA:  Preoperative examination prior to heart catheterization. EXAM: CHEST  2 VIEW COMPARISON:  Report of a  chest x-ray of November 14, 1999. FINDINGS: The lungs are mildly hyperinflated with hemidiaphragm flattening. There is no focal infiltrate. There is no pleural effusion. The patient has undergone previous CABG. The heart and pulmonary vascularity are normal. The sternal wires are intact. There is calcification in the wall of the aortic arch and descending thoracic aorta. There is mild anterior wedging of 3 mid thoracic vertebral bodies. IMPRESSION: Mild chronic bronchitic changes. Previous CABG. No CHF nor pneumonia. Aortic atherosclerosis. Electronically Signed   By: David  [place] M.D.   On: 09/22/2016 13:12    EKG: NSR with first degree AV block  Cardiac Studies: 2D Echo: Study Conclusions  - Left ventricle: The cavity size was normal. There was mild   concentric hypertrophy. Systolic function was normal. The   estimated ejection fraction was in the range of 60% to 65%. Wall   motion was normal; there were no regional wall motion   abnormalities. Doppler parameters are consistent with abnormal   left ventricular relaxation (grade 1 diastolic dysfunction). - Aortic valve: Valve mobility was restricted. There was severe   stenosis. There was trivial regurgitation. Peak velocity (S): 516   cm/s. Mean gradient (S): 59 mm Hg. Valve area (Vmean): 0.53 cm^2. - Mitral valve: Calcified annulus. - Left atrium: The atrium was mildly dilated. Anterior-posterior   dimension: 44 mm. - Pulmonary arteries: Systolic pressure was mildly to moderately   increased. PA peak pressure: 41 mm Hg (S).  Impressions:  - Severe aortic stenosis (Prior mean gradient ).  Cardiac Cath: CARDIAC CATHETERIZATION / PV Anio    History obtained from chart review.The patient is a very pleasant 82 year old, mildly overweight, married Caucasian male, father of 2, grandfather to 1 grandchild who I last saw 04/07/16. He is accompanied by his wife today. He has a history of CAD status post coronary artery bypass  grafting in 2000, with a LIMA to his LAD, a vein graft to the first obtuse marginal branch, second obtuse marginal branch and to the RCA. His other problems include hypertension, hyperlipidemia and noninsulin-requiring diabetes. He does have bilateral internal carotid artery stenosis left greater than right which has remained stable over the last 6 months. He is neurologically asymptomatic. He also has mild aortic stenosis with valve area of 1.4 cm squared. His last Myoview performed October 06, 2011, was low risk. He is  asymptomatic. Dr. Sheria Lang follows his lipid profile. He has had progressive aortic stenosis with his most recent echo performed 02/28/16 were revealing preserved LV function . He also has moderate bilateral ICA stenosis by duplex ultrasound. Since I saw him in April of this year he's had progressive dyspnea on exertion. I suspect that his aortic stenosis has progressed. We will plan on performing right and left heart cardiac catheterization to define his anatomy and physiology in anticipation of potential TAVR.     IMPRESSION: Mr. Scalzo grafts are patent. He does have a moderate anastomotic lesion of his RCA vein graft. I was unable to cross the valve because of the severity of stenosis. He has normal LV function by 2-D echo. I did do an abdominal aortogram documented the caliber of his distal aorta and iliac arteries and potential suitability for TAVR. The sheath will be removed and pressure held on the groin to achieve hemostasis. The patient left the lab in stable condition. I've discussed the case with Dr. Excell Seltzer who will see the patient prior to discharge tomorrow morning and evaluate suitability for TA VR.  Nanetta Batty. MD, [facility] 10/05/2016 4:05 PM      Indications   Critical aortic valve stenosis [I35.0 (ICD-10-CM)]  Coronary artery disease due to lipid rich plaque [I25.10, I25.83 (ICD-10-CM)]  Procedural Details/Technique   Technical Details PROCEDURE  DESCRIPTION:   The patient was brought to the second floor [place] [facility] cath lab in the postabsorptive state. He was not premedicated . His right groin was prepped and shaved in usual sterile fashion. Xylocaine 1% was used for local anesthesia. A 5 French sheath was inserted into the right common femoral artery using standard Seldinger technique. A 7 French sheath was inserted into the right common femoral vein. 5 French right and left Judkins diagnostic catheters along the 5 French pigtail catheter were used for selective coronary angiography, selective vein graft and IMA graft angiography and distal abdominal aortography. Isovue dye was used for the entirety of the case. Retrograde aortic, left ventricular and pullback pressures were recorded.   Estimated blood loss <50 mL.  During this procedure no sedation was administered.    Coronary Findings   Dominance: Right  Left Anterior Descending  Prox LAD to Mid LAD lesion, 90% stenosed.  Left Circumflex  Ost Cx to Prox Cx lesion, 100% stenosed.  Right Coronary Artery  Ost RCA to Mid RCA lesion, 100% stenosed.  Graft Angiography  Graft to 2nd Mrg  LIMA Graft to Dist LAD  Graft to 2nd Diag  Graft to Ost RPDA  Insertion lesion, 75% stenosed.  Right Heart   Right Heart Pressures Right atrial pressure-4/8, mean equals 2 Right ventricular pressure-33/0 Pulmonary artery pressure-30/11, mean equals 20 Pulmonary capillary wedge pressure-a with equals 12, V-wave equals 11, mean equals 9 Cardiac output by Fick-5.63 L/m Cardiac output by thermal-6.7 L/m    Coronary Diagrams   Diagnostic Diagram      STS Risk Calculator: Procedure: AV Replacement  Risk of Mortality: 4.696%  Morbidity or Mortality: 24.46%  Long Length of Stay: 9.063%  Short Length of Stay: 22.29%  Permanent Stroke: 2.736%  Prolonged Ventilation: 16.251%  DSW Infection: 0.393%  Renal Failure: 11.6%  Reoperation: 8.312%    ASSESSMENT AND PLAN:  82 year old  gentleman with severe, stage D aortic stenosis. The patient presents with progressive exertional dyspnea over the last 3-6 months. I have reviewed the natural history of aortic stenosis with the patient and their family members who are present today.  We have discussed the limitations of medical therapy and the poor prognosis associated with symptomatic aortic stenosis. We have reviewed potential treatment options, including palliative medical therapy, conventional surgical aortic valve replacement, and transcatheter aortic valve replacement. We discussed treatment options in the context of this patient's specific comorbid medical conditions.   His STS PROM with conventional AVR is 4.7% based on previous cardiac surgery, diabetes treated with oral hypoglycemics, extracranial carotid stenosis without hx of stroke, and NYHA functional class II diastolic heart failure.   I have personally reviewed his echo and cath images. Echo reveals a severely calcified aortic valve with severely restricted leaflet mobility, preserved LV systolic function, peak and mean transaortic velocities of 106 and 61 mmHg, and mild AI.  Cardiac catheterization reveals severe native 3 vessel CAD with total occlusion of the left and right coronary arteries. All bypass grafts are patent with wide patency of the LIMA-LAD, and SVG-OM grafts, and a patent SVG-RCA with a distal anastomotic lesion into an angulated area of the PDA.  I think TAVR would be a reasonable treatment option in this functionally independent but elderly gentleman with previous cardiac surgery and comorbid conditions outlined above. We specifically reviewed the necessary evaluation for TAVR which includes a gated cardiac CTA study, I CTA of the chest/abdomen/pelvis, and formal cardiac surgical evaluation. I explained to the patient and family the process of this evaluation. He will be contacted with the schedule of his tests and consultations. We reviewed the  expectations of TAVR if he has found to be a candidate. We reviewed specific risks, expected postoperative recovery, as well as treatment alternatives. All questions are answered today. The patient will be discharged from the hospital and contacted with the schedule of his studies and appointments.   SignedTonny Bollman MD, [facility] 10/06/2016, 8:55 AM

## 2016-10-06 NOTE — Discharge Summary (Signed)
Discharge Summary    Patient ID: Thomas English,  MRN: [id number], DOB/AGE: 09/18/1932 82 y.o.  Admit date: 10/05/2016 Discharge date: 10/06/2016  Primary Care Provider: Maryjean Ka Primary Cardiologist: Dr. Allyson Sabal  Discharge Diagnoses    Principal Problem:   Aortic stenosis, severe Active Problems:   Essential hypertension   Hyperlipidemia   Type 2 diabetes mellitus (HCC)   Carotid stenosis, bil, last dopplers 12/2013 50-69%   Coronary artery disease   CAD (coronary artery disease)   Allergies No Known Allergies   History of Present Illness     Thomas English is a 82 y.o. male with a history of CAD s/p CABG (2000), carotid artery stenosis, HTN, HLD, DMT2 and recently diagnosed severe AS who presented to [facility] on 10/05/16 for elective cardiac cath.   He has a history of CAD status post coronary artery bypass grafting in 2000, with a LIMA to his LAD, a vein graft to the first obtuse marginal branch, second obtuse marginal branch and to the RCA. At that time he presented with symptoms of exertional dyspnea and was found to have multivessel coronary artery disease treated with CABG. He has done relatively well until the past 3-6 months when he has developed symptoms of exertional dyspnea. The patient is short of breath whenever he "gets in a hurry." He can do normal activities around the house and walk slowly without any symptoms. He denies chest pain, heart palpitations, dizziness, or syncope. He has no orthopnea or PND.  He was recently seen in the office by Dr. Gery Pray after undergoing an echocardiogram which demonstrated critical aortic stenosis. At the time of his office evaluation he admitted to worsening symptoms of shortness of breath. It was decided to proceed with coronary angiography to assess his coronary arteries as well as aortic valve. This was set up for 10/05/16.   Hospital Course     Consultants: none  Critical aortic stenosis: cardiac catheterization  yesterday showed severe native vessel coronary artery disease with continued patency of his bypass grafts to the LAD, OM branches of the circumflex and RCA distributions. He was seen by Dr. Excell Seltzer today for TAVR assessment. His STS PROM with conventional AVR is 4.7% based on previous cardiac surgery, diabetes treated with oral hypoglycemics, extracranial carotid stenosis without hx of stroke, and NYHA functional class II diastolic heart failure. Dr. Excell Seltzer personally reviewed his echo and cath images. Echo reveals a severely calcified aortic valve with severely restricted leaflet mobility, preserved LV systolic function, peak and mean transaortic velocities of 106 and 61 mmHg, and mild AI. Per Dr. Excell Seltzer, he believes TAVR would be a reasonable treatment option in this functionally independent but elderly gentleman with previous cardiac surgery and comorbid conditions as outlined above. He will be discharged from the hospital and contacted with the schedule of his studies and appointments for the further workup for planned TAVR.  Coronary artery disease: cardiac catheterization yesterday showed severe native vessel coronary artery disease with continued patency of his bypass grafts to the LAD, OM branches of the circumflex and RCA distributions. Continue plavix, statin and BB  Essential hypertension: BP stable.   Hyperlipidemia: continue statin.   DMT2: continue current regimen    The patient has had an uncomplicated hospital course and is recovering well. The femoral catheter site is stable He has been seen by Dr. Excell Seltzer today and deemed ready for discharge home. All follow-up appointments have been scheduled. Discharge medications are listed below.  _____________  Discharge Vitals  Blood pressure (!) 113/48, pulse 63, temperature 98.2 F (36.8 C), temperature source Oral, resp. rate 17, height 5\' 11"  (1.803 m), weight 174 lb 2.6 oz (79 kg), SpO2 96 %.  Filed Weights   10/05/16 1104 10/06/16 0422    Weight: 173 lb (78.5 kg) 174 lb 2.6 oz (79 kg)    Labs & Radiologic Studies     CBC  Recent Labs  10/06/16 0412  WBC 4.9  HGB 10.7*  HCT 31.9*  MCV 92.2  PLT 169   Basic Metabolic Panel  Recent Labs  10/06/16 0412  NA 140  K 4.1  CL 109  CO2 24  GLUCOSE 88  BUN 15  CREATININE 1.22  CALCIUM 8.4*     Dg Chest 2 View  Result Date: 09/22/2016 CLINICAL DATA:  Preoperative examination prior to heart catheterization. EXAM: CHEST  2 VIEW COMPARISON:  Report of a chest x-ray of November 14, 1999. FINDINGS: The lungs are mildly hyperinflated with hemidiaphragm flattening. There is no focal infiltrate. There is no pleural effusion. The patient has undergone previous CABG. The heart and pulmonary vascularity are normal. The sternal wires are intact. There is calcification in the wall of the aortic arch and descending thoracic aorta. There is mild anterior wedging of 3 mid thoracic vertebral bodies. IMPRESSION: Mild chronic bronchitic changes. Previous CABG. No CHF nor pneumonia. Aortic atherosclerosis. Electronically Signed   By: David  SwazilandJordan M.D.   On: 09/22/2016 13:12     Diagnostic Studies/Procedures    2D Echo: 10/02/16 Study Conclusions - Left ventricle: The cavity size was normal. There was mild concentric hypertrophy. Systolic function was normal. The estimated ejection fraction was in the range of 60% to 65%. Wall motion was normal; there were no regional wall motion abnormalities. Doppler parameters are consistent with abnormal left ventricular relaxation (grade 1 diastolic dysfunction). - Aortic valve: Valve mobility was restricted. There was severe stenosis. There was trivial regurgitation. Peak velocity (S): 516 cm/s. Mean gradient (S): 59 mm Hg. Valve area (Vmean): 0.53 cm^2. - Mitral valve: Calcified annulus. - Left atrium: The atrium was mildly dilated. Anterior-posterior dimension: 44 mm. - Pulmonary arteries: Systolic pressure was mildly  to moderately increased. PA peak pressure: 41 mm Hg (S). Impressions: - Severe aortic stenosis (Prior mean gradient 55mmHg).   _____________   Cardiac Cath: 10/05/16 CARDIAC CATHETERIZATION / PV Angio IMPRESSION:Mr. Mella's grafts are patent. He does have a moderate anastomotic lesion of his RCA vein graft. I was unable to cross the valve because of the severity of stenosis. He has normal LV function by 2-D echo. I did do an abdominal aortogram documented the caliber of his distal aorta and iliac arteries and potential suitability for TAVR. The sheath will be removed and pressure held on the groin to achieve hemostasis. The patient left the lab in stable condition. I've discussed the case with Dr. Excell Seltzerooper who will see the patient prior to discharge tomorrow morning and evaluate suitability for TA VR.  Nanetta BattyBerry, Jonathan. MD, [facility] Center[facility] 10/05/2016 4:05 PM    _____________    Disposition   Pt is being discharged home today in good condition.  Follow-up Plans & Appointments    Follow-up Information    Tonny Bollmanooper, Michael, MD .   Specialty:  Cardiology Why:  Dr. Earmon Phoenixooper's nurse, Lauren, will call you to set up an appointment in the office in the next couple weeks Contact information: [address] [place] [address] [phone]  Discharge Medications     Medication List    TAKE these medications   amLODipine 10 MG tablet Commonly known as:  NORVASC Take 10 mg by mouth daily with breakfast.   benazepril 20 MG tablet Commonly known as:  LOTENSIN Take 20 mg by mouth daily with breakfast.   carvedilol 6.25 MG tablet Commonly known as:  COREG Take 6.25 mg by mouth 2 (two) times daily with a meal.   clopidogrel 75 MG tablet Commonly known as:  PLAVIX Take 75 mg by mouth daily.   levothyroxine 88 MCG tablet Commonly known as:  SYNTHROID, LEVOTHROID Take 88 mcg by mouth every evening.   Lutein 6 MG Caps Take 6 mg by mouth daily with  breakfast.   metFORMIN 500 MG tablet Commonly known as:  GLUCOPHAGE Take 500 mg by mouth daily with breakfast.   simvastatin 20 MG tablet Commonly known as:  ZOCOR Take 20 mg by mouth every evening.         Outstanding Labs/Studies   He will be contacted by Leotis Shames, Dr. Earmon Phoenix nurse to schedule these  Duration of Discharge Encounter   Greater than 30 minutes including physician time.  Signed, Cline Crock PA-C 10/06/2016, 12:27 PM

## 2016-10-13 ENCOUNTER — Ambulatory Visit (HOSPITAL_COMMUNITY)
Admit: 2016-10-13 | Discharge: 2016-10-13 | Disposition: A | Discharge status: Home / Self Care | Payer: Medicare Other | Attending: Cardiovascular Disease | Admitting: Cardiovascular Disease

## 2016-10-13 ENCOUNTER — Ambulatory Visit (HOSPITAL_COMMUNITY): Payer: Medicare Other

## 2016-10-13 ENCOUNTER — Ambulatory Visit: Payer: Medicare Other | Attending: Cardiovascular Disease | Admitting: Physical Therapy

## 2016-10-13 ENCOUNTER — Encounter: Payer: Self-pay | Admitting: Physical Therapy

## 2016-10-13 ENCOUNTER — Ambulatory Visit (HOSPITAL_COMMUNITY)
Admit: 2016-10-13 | Discharge: 2016-10-13 | Disposition: A | Discharge status: Home / Self Care | Payer: Medicare Other | Source: Ambulatory Visit | Attending: Cardiovascular Disease | Admitting: Cardiovascular Disease

## 2016-10-13 DIAGNOSIS — I700 Atherosclerosis of aorta: Secondary | ICD-10-CM | POA: Diagnosis not present

## 2016-10-13 DIAGNOSIS — R918 Other nonspecific abnormal finding of lung field: Secondary | ICD-10-CM | POA: Insufficient documentation

## 2016-10-13 DIAGNOSIS — I350 Nonrheumatic aortic (valve) stenosis: Secondary | ICD-10-CM | POA: Diagnosis not present

## 2016-10-13 DIAGNOSIS — Z951 Presence of aortocoronary bypass graft: Secondary | ICD-10-CM | POA: Insufficient documentation

## 2016-10-13 DIAGNOSIS — R293 Abnormal posture: Secondary | ICD-10-CM | POA: Insufficient documentation

## 2016-10-13 DIAGNOSIS — I2510 Atherosclerotic heart disease of native coronary artery without angina pectoris: Secondary | ICD-10-CM | POA: Insufficient documentation

## 2016-10-13 DIAGNOSIS — R2689 Other abnormalities of gait and mobility: Secondary | ICD-10-CM | POA: Diagnosis not present

## 2016-10-13 LAB — PULMONARY FUNCTION TEST
DL/VA % pred: 94 %
DL/VA: 4.2 ml/min/mmHg/L
DLCO UNC % PRED: 60 %
DLCO UNC: 17.98 ml/min/mmHg
FEF 25-75 PRE: 1.15 L/s
FEF 25-75 Post: 1.57 L/sec
FEF2575-%Change-Post: 37 %
FEF2575-%PRED-PRE: 67 %
FEF2575-%Pred-Post: 92 %
FEV1-%Change-Post: 7 %
FEV1-%PRED-POST: 78 %
FEV1-%Pred-Pre: 72 %
FEV1-POST: 1.98 L
FEV1-Pre: 1.84 L
FEV1FVC-%Change-Post: 0 %
FEV1FVC-%Pred-Pre: 98 %
FEV6-%CHANGE-POST: 6 %
FEV6-%PRED-POST: 82 %
FEV6-%PRED-PRE: 77 %
FEV6-PRE: 2.6 L
FEV6-Post: 2.77 L
FEV6FVC-%CHANGE-POST: 0 %
FEV6FVC-%PRED-PRE: 106 %
FEV6FVC-%Pred-Post: 106 %
FVC-%CHANGE-POST: 6 %
FVC-%Pred-Post: 77 %
FVC-%Pred-Pre: 72 %
FVC-Post: 2.81 L
FVC-Pre: 2.63 L
POST FEV6/FVC RATIO: 99 %
PRE FEV1/FVC RATIO: 70 %
Post FEV1/FVC ratio: 70 %
Pre FEV6/FVC Ratio: 99 %
RV % PRED: 42 %
RV: 1.11 L
TLC % PRED: 54 %
TLC: 3.66 L

## 2016-10-13 MED ORDER — IOPAMIDOL (ISOVUE-370) INJECTION 76%
INTRAVENOUS | Status: AC
  Administered 2016-10-13: 50 mL
  Filled 2016-10-13: qty 100

## 2016-10-13 MED ORDER — IOPAMIDOL (ISOVUE-370) INJECTION 76%
INTRAVENOUS | Status: AC
  Administered 2016-10-13: 100 mL
  Filled 2016-10-13: qty 50

## 2016-10-13 MED ORDER — ALBUTEROL SULFATE (2.5 MG/3ML) 0.083% IN NEBU
2.5000 mg | INHALATION_SOLUTION | Freq: Once | RESPIRATORY_TRACT | Status: AC
  Administered 2016-10-13: 2.5 mg via RESPIRATORY_TRACT

## 2016-10-13 NOTE — Therapy (Signed)
[facility] [address] [place], [place], [address] Phone: [phone]   Fax:  [phone]  Physical Therapy Evaluation  Patient Details  Name: Thomas LooseOdell C Sauber MRN: [id number] Date of Birth: 05/16/1932 Referring Provider: Dr. Tonny BollmanMichael Cooper  Encounter Date: 10/13/2016      PT End of Session - 10/13/16 0921    Visit Number 1   PT Start Time [id number]   PT Stop Time 0949   PT Time Calculation (min) 32 min      Past Medical History:  Diagnosis Date  . Aortic stenosis   . Arthritis   . CAD (coronary artery disease)    a. CAD s/p CABG 2000   b. cath on 10/05/16 with severe native vessel CAD and patent bypass grafts  . Carotid stenosis    bilateral internal, left greater than right, stable  . Diabetes mellitus type 2, noninsulin dependent (HCC)   . Hyperlipidemia   . Hypertension     Past Surgical History:  Procedure Laterality Date  . CARDIAC CATHETERIZATION  11/10/1999   3-vessel disease, recommendation for CABG  . CARDIAC CATHETERIZATION N/A 10/05/2016   Procedure: Right/Left Heart Cath and Coronary/Graft Angiography;  Surgeon: Runell GessJonathan J Berry, MD;  [facility];  Service: Cardiovascular;  Laterality: N/A;  . CORONARY ARTERY BYPASS GRAFT  11/13/1999   LIMA to LAD and a vein graft to the first OM branch, second OM branch, and to the RCA (Dr. Wayland SalinasB. Bartle)  . NM MYOCAR PERF WALL MOTION  09/2013   normal lexiscan myoview  . PERIPHERAL VASCULAR CATHETERIZATION N/A 10/05/2016   Procedure: Abdominal Aortogram;  Surgeon: Runell GessJonathan J Berry, MD;  [facility];  Service: Cardiovascular;  Laterality: N/A;  . TRANSTHORACIC ECHOCARDIOGRAM  12/2013   EF 55-60%, grade 1 diastolic dysfunction, high ventricular filling pressure; severe AV stenosis; calcified MV annulus; LA & RA mildly dilated; atrial septal aneurysm    There were no vitals filed for this visit.       Subjective Assessment - 10/13/16 0921     Subjective Pt reports noticing shortness of breath since early 2017. He is still able to do his nromal acitivites including yardwork - it just takes more time. He really notices shortness of breath with taking out the garbage can.    Patient Stated Goals stay active   Currently in Pain? No/denies            [facility] - 10/13/16 0001      Assessment   Medical Diagnosis severe aortic stenosis   Referring Provider Dr. Tonny BollmanMichael Cooper   Onset Date/Surgical Date 12/23/15  approximate     Precautions   Precautions None     Restrictions   Weight Bearing Restrictions No     Balance Screen   Has the patient fallen in the past 6 months No   Has the patient had a decrease in activity level because of a fear of falling?  No   Is the patient reluctant to leave their home because of a fear of falling?  No     Home [employer] information centre managernvironment   Living Environment Private residence   Living Arrangements Spouse/significant other   Home Access Stairs to enter   Entrance Stairs-Number of Steps 3   Entrance Stairs-Rails Right;Left;Can reach both   Home Layout Two level;Laundry or work area in basement  wife utilizes basement     Prior Function   Level of Independence Independent with community mobility without device  Posture/Postural Control   Posture/Postural Control Postural limitations   Postural Limitations Rounded Shoulders;Forward head     ROM / Strength   AROM / PROM / Strength AROM;Strength     AROM   Overall AROM Comments grossly WNL throughout     Strength   Overall Strength Comments grossly 5/5 throughout   Strength Assessment Site Hand   Right/Left hand Right;Left   Right Hand Grip (lbs) 82  R hand dominant   Left Hand Grip (lbs) 80     Ambulation/Gait   Gait Pattern Decreased arm swing - left;Decreased arm swing - right  severe forward head/rounded shoulders          [facility] Pre-Surgical Assessment - 2016-10-21 0001    5 Meter Walk Test- trial 1 5 sec   5 Meter  Walk Test- trial 2 6 sec.    5 Meter Walk Test- trial 3 5 sec.   5 meter walk test average 5.33 sec   4 Stage Balance Test tolerated for:  8 sec.   4 Stage Balance Test Position 4   Sit To Stand Test- trial 1 14 sec.   ADL/IADL Independent with: Bathing;Dressing;Meal prep;Yard work   6 Minute Walk- Baseline yes   BP (mmHg) 148/77   HR (bpm) 72   02 Sat (%RA) 99 %   Modified Borg Scale for Dyspnea 0- Nothing at all   Perceived Rate of Exertion (Borg) 6-   6 Minute Walk Post Test yes   BP (mmHg) 157/77   HR (bpm) 119   02 Sat (%RA) 98 %   Modified Borg Scale for Dyspnea 2- Mild shortness of breath   Perceived Rate of Exertion (Borg) 9- very light   Aerobic Endurance Distance Walked 1005   Endurance additional comments no rest breaks required                                     Plan - Oct 21, 2016 1045    Clinical Impression Statement see below   PT Frequency One time visit   Consulted and Agree with Plan of Care Patient;Family member/caregiver     Clinical Impression Statement: Pt is an 82 yo male presenting to OP PT for evaluation prior to possible TAVR surgery due to severe aortic stenosis. Pt reports onset of shortness of breath with activity approximately 10 months ago. Symptoms are limiting his ability to perform chores without extra time/rest. Pt presents with good ROM and strength, good balance and is not at high fall risk 4 stage balance test, good walking speed and fair to good aerobic endurance per 6 minute walk test. Pt reported 2/10 shortness of breath on modified scale for dyspnea. Pt ambulated a total of 1005 feet in 6 minute walk.  Based on the Short Physical Performance Battery, patient has a frailty rating of 10/12 with </= 5/12 considered frail.   Patient demonstrated the following deficits and impairments:     Visit Diagnosis: Abnormal posture - Plan: PT plan of care cert/re-cert  Other abnormalities of gait and mobility - Plan: PT plan  of care cert/re-cert      G-Codes - 2016/10/21 1047    Functional Assessment Tool Used 6 minute walk 1005 feet   Functional Limitation Mobility: Walking and moving around   Mobility: Walking and Moving Around Current Status (E4540) At least 20 percent but less than 40 percent impaired, limited or restricted   Mobility: Walking and  Moving Around Goal Status [phone]) At least 20 percent but less than 40 percent impaired, limited or restricted   Mobility: Walking and Moving Around Discharge Status [phone]) At least 20 percent but less than 40 percent impaired, limited or restricted       Problem List Patient Active Problem List   Diagnosis Date Noted  . CAD (coronary artery disease)   . Aortic stenosis, severe 10/05/2016  . Coronary artery disease 03/21/2014  . Carotid stenosis, bil, last dopplers 12/2013 50-69% 02/07/2014  . Essential hypertension 07/20/2013  . Hyperlipidemia 07/20/2013  . Type 2 diabetes mellitus (HCC) 07/20/2013    Mayan Kloepfer, PT 10/13/2016, 10:51 AM  [facility] [address] [place], [place], [address] Phone: [phone]   Fax:  [phone]  Name: DREDYN GUBBELS MRN: [id number] Date of Birth: 25-Dec-1931

## 2016-10-13 NOTE — Progress Notes (Signed)
CT scan completed. Tolerated well. D/C with wife. Wake and alert. In no distress.

## 2016-10-14 ENCOUNTER — Institutional Professional Consult (permissible substitution) (INDEPENDENT_AMBULATORY_CARE_PROVIDER_SITE_OTHER): Payer: Medicare Other | Admitting: Surgery

## 2016-10-14 ENCOUNTER — Encounter: Payer: Self-pay | Admitting: Surgery

## 2016-10-14 ENCOUNTER — Encounter: Payer: Medicare Other | Admitting: Surgery

## 2016-10-14 VITALS — BP 134/64 | HR 59 | Resp 20 | Ht 71.0 in | Wt 174.0 lb

## 2016-10-14 DIAGNOSIS — I350 Nonrheumatic aortic (valve) stenosis: Secondary | ICD-10-CM

## 2016-10-16 ENCOUNTER — Other Ambulatory Visit: Payer: Self-pay | Admitting: *Deleted

## 2016-10-16 DIAGNOSIS — I350 Nonrheumatic aortic (valve) stenosis: Secondary | ICD-10-CM

## 2016-10-18 ENCOUNTER — Encounter: Payer: Self-pay | Admitting: Surgery

## 2016-10-18 NOTE — Progress Notes (Signed)
Patient ID: Thomas English, male   DOB: August 12, 1932, 82 y.o.   MRN: [id number]  [facility]  MULTIDISCIPLINARY HEART VALVE CLINIC  CARDIOTHORACIC SURGERY CONSULTATION REPORT  Referring Provider is Runell Gess, MD PCP is Maryjean Ka, MD  Chief Complaint  Patient presents with  . Aortic Stenosis    Surgica eval for TAVR surgery, discuss all studies    HPI:  The patient is an 82 year old gentleman with hypertension, hyperlipidemia and diabetes as well as coronary artery disease s/p CABG by me in 2000. He has done well over the past 17 years but has developed progressive exertional shortness of breath over the past 6 months. He says that his normal activities around the house do not cause any symptoms but if he walks fast or pulls his trash cans out he gets short of breath. He has a known history of aortic stenosis and his 2D echo in 02/2015 showed severe AS with a mean gradient of 44 mm Hg and a trileaflet severely calcified valve. His most recent echo on 10/02/2016 shows further progression with an increase in the mean gradient to 59 mm Hg with trivial AI and normal LV function. He underwent cardiac cath on 10/05/2016 showing severe multi-vessel coronary disease with patent grafts.  He is here today with his wife and daughter. He lives at home with his wife and remains independent. He gets out to the store to shop and tries to remain active. He says that as long as he goes slow he if fine. He has not had any chest pain or dizziness. He denies orthopnea.  Past Medical History:  Diagnosis Date  . Aortic stenosis   . Arthritis   . CAD (coronary artery disease)    a. CAD s/p CABG 2000   b. cath on 10/05/16 with severe native vessel CAD and patent bypass grafts  . Carotid stenosis    bilateral internal, left greater than right, stable  . Diabetes mellitus type 2, noninsulin dependent (HCC)   . Hyperlipidemia   . Hypertension     Past Surgical History:  Procedure  Laterality Date  . CARDIAC CATHETERIZATION  11/10/1999   3-vessel disease, recommendation for CABG  . CARDIAC CATHETERIZATION N/A 10/05/2016   Procedure: Right/Left Heart Cath and Coronary/Graft Angiography;  Surgeon: Runell Gess, MD;  Location: [facility] INVASIVE CV LAB;  Service: Cardiovascular;  Laterality: N/A;  . CORONARY ARTERY BYPASS GRAFT  11/13/1999   LIMA to LAD and a vein graft to the first OM branch, second OM branch, and to the RCA (Dr. Wayland Salinas)  . NM MYOCAR PERF WALL MOTION  09/2013   normal lexiscan myoview  . PERIPHERAL VASCULAR CATHETERIZATION N/A 10/05/2016   Procedure: Abdominal Aortogram;  Surgeon: Runell Gess, MD;  Location: [facility] INVASIVE CV LAB;  Service: Cardiovascular;  Laterality: N/A;  . TRANSTHORACIC ECHOCARDIOGRAM  12/2013   EF 55-60%, grade 1 diastolic dysfunction, high ventricular filling pressure; severe AV stenosis; calcified MV annulus; LA & RA mildly dilated; atrial septal aneurysm    Family History  Problem Relation Age of Onset  . Lung disease Mother   . Colon cancer Father   . Diabetes Sister   . CAD Sister     Social History   Social History  . Marital status: Married    Spouse name: N/A  . Number of children: 2  . Years of education: GED   Occupational History  . Not on file.   Social History Main Topics  .  Smoking status: Former Smoker    Packs/day: 1.00    Years: 5.00    Types: Cigarettes    Quit date: 07/20/1973  . Smokeless tobacco: Never Used  . Alcohol use No  . Drug use: No  . Sexual activity: Not on file   Other Topics Concern  . Not on file   Social History Narrative  . No narrative on file    Current Outpatient Prescriptions  Medication Sig Dispense Refill  . amLODipine (NORVASC) 10 MG tablet Take 10 mg by mouth daily with breakfast.   6  . benazepril (LOTENSIN) 20 MG tablet Take 20 mg by mouth daily with breakfast.   6  . carvedilol (COREG) 6.25 MG tablet Take 6.25 mg by mouth 2 (two) times daily with a meal.     . clopidogrel (PLAVIX) 75 MG tablet Take 75 mg by mouth daily.     Marland Kitchen levothyroxine (SYNTHROID, LEVOTHROID) 88 MCG tablet Take 88 mcg by mouth every evening.   0  . Lutein 6 MG CAPS Take 6 mg by mouth daily with breakfast.     . metFORMIN (GLUCOPHAGE) 500 MG tablet Take 500 mg by mouth daily with breakfast.    . simvastatin (ZOCOR) 20 MG tablet Take 20 mg by mouth every evening.      No current facility-administered medications for this visit.     No Known Allergies    Review of Systems:   General:  normal appetite, decreased energy, no weight gain, no weight loss, no fever  Cardiac:  no chest pain with exertion, no chest pain at rest, mild SOB with moderate exertion, no resting SOB, no PND, no orthopnea, no palpitations, no arrhythmia, no atrial fibrillation, no LE edema, no dizzy spells, no syncope  Respiratory:  exertional shortness of breath, no home oxygen, no productive cough, no dry cough, no bronchitis, no wheezing, no hemoptysis, no asthma, no pain with inspiration or cough, no sleep apnea, no CPAP at night  GI:   no difficulty swallowing, no reflux, no frequent heartburn, no hiatal hernia, no abdominal pain, no constipation, no diarrhea, no hematochezia, no hematemesis, no melena  GU:   no dysuria,  no frequency, no urinary tract infection, no hematuria, no enlarged prostate, no kidney stones, no kidney disease  Vascular:  no pain suggestive of claudication, no pain in feet, has leg cramps, no varicose veins, no DVT, no non-healing foot ulcer  Neuro:   no stroke, no TIA's, no seizures, no headaches, no temporary blindness one eye,  no slurred speech, no peripheral neuropathy, no chronic pain, no instability of gait, no memory/cognitive dysfunction  Musculoskeletal: no arthritis, no joint swelling, no myalgias, no difficulty walking, normal mobility   Skin:   no rash, no itching, no skin infections, no pressure sores or ulcerations  Psych:   no anxiety, no depression, no  nervousness, no unusual recent stress  Eyes:   no blurry vision, no floaters, no recent vision changes,  wears glasses or contacts  ENT:   has hearing loss, no loose or painful teeth, no dentures, last saw dentist 08/26/2016  Hematologic:  has easy bruising, no abnormal bleeding, no clotting disorder, no frequent epistaxis  Endocrine:  has diabetes, does not check CBG's at home           Physical Exam:   BP 134/64 (BP Location: Right Arm, Patient Position: Sitting, Cuff Size: Normal)   Pulse (!) 59   Resp 20   Ht 5\' 11"  (1.803 m)  Wt 174 lb (78.9 kg)   SpO2 98% Comment: RA  BMI 24.27 kg/m   General:  Elderly but  well-appearing  HEENT:  Unremarkable , NCAT, PERLA, EOMI, oropharynx clear  Neck:   no JVD, no bruits, no adenopathy or thyromegaly  Chest:   clear to auscultation, symmetrical breath sounds, no wheezes, no rhonchi   CV:   RRR, grade III/VI crescendo/decrescendo murmur heard best at RSB,  no diastolic murmur  Abdomen:  soft, non-tender, no masses or organomegaly  Extremities:  warm, well-perfused, pulses palpable in feet, no LE edema  Rectal/GU  Deferred  Neuro:   Grossly non-focal and symmetrical throughout  Skin:   Clean and dry, no rashes, no breakdown   Diagnostic Tests:     [facility]                        [address]                        [place], [place] [address]                            [phone]  ------------------------------------------------------------------- Transthoracic Echocardiography  Patient:    Doyce LooseMoffitt, Deunta C MR #:       [id number] Study Date: 10/02/2016 Gender:     M Age:        83 Height:     180.3 cm Weight:     78.7 kg BSA:        1.99 m^2 Pt. Status: Room:   ORDERING     Nanetta BattyJonathan Berry, MD  REFERRING    Nanetta BattyJonathan Berry, MD  SONOGRAPHER  [place]wards, Will  ATTENDING    Donato SchultzMark Skains, M.D.  PERFORMING   [facility], Outpatient  cc:  ------------------------------------------------------------------- LV EF: 60%  -   65%  ------------------------------------------------------------------- Indications:      (I35.0).  ------------------------------------------------------------------- History:   PMH:  Acquired from the patient and from the patient&'s chart.  Coronary artery disease.  Severe aortic stenosis.  Risk factors:  Former tobacco use. Hypertension. Dyslipidemia.  ------------------------------------------------------------------- Study Conclusions  - Left ventricle: The cavity size was normal. There was mild   concentric hypertrophy. Systolic function was normal. The   estimated ejection fraction was in the range of 60% to 65%. Wall   motion was normal; there were no regional wall motion   abnormalities. Doppler parameters are consistent with abnormal   left ventricular relaxation (grade 1 diastolic dysfunction). - Aortic valve: Valve mobility was restricted. There was severe   stenosis. There was trivial regurgitation. Peak velocity (S): 516   cm/s. Mean gradient (S): 59 mm Hg. Valve area (Vmean): 0.53 cm^2. - Mitral valve: Calcified annulus. - Left atrium: The atrium was mildly dilated. Anterior-posterior   dimension: 44 mm. - Pulmonary arteries: Systolic pressure was mildly to moderately   increased. PA peak pressure: 41 mm Hg (S).  Impressions:  - Severe aortic stenosis (Prior mean gradient 55mmHg).  ------------------------------------------------------------------- Labs, prior tests, procedures, and surgery: Coronary artery bypass grafting.  ------------------------------------------------------------------- Study data:  Comparison was made to the study of 02/28/2016.  Study status:  Routine.  Procedure:  The patient reported no pain pre or post test. Transthoracic echocardiography for left ventricular function evaluation and for assessment of valvular function. Image quality was adequate.  Study completion:  There were no complications.  Transthoracic  echocardiography.  M-mode, complete 2D, spectral Doppler, and color Doppler.  Birthdate: Patient birthdate: [birth year].  Age:  Patient is 82 yr old.  Sex: Gender: male.    BMI: 24.2 kg/m^2.  Blood pressure:     153/67 Patient status:  Outpatient.  Study date:  Study date: 10/02/2016. Study time: 07:45 AM.  Location:  [facility]  -------------------------------------------------------------------  ------------------------------------------------------------------- Left ventricle:  The cavity size was normal. There was mild concentric hypertrophy. Systolic function was normal. The estimated ejection fraction was in the range of 60% to 65%. Wall motion was normal; there were no regional wall motion abnormalities. Doppler parameters are consistent with abnormal left ventricular relaxation (grade 1 diastolic dysfunction).  ------------------------------------------------------------------- Aortic valve:   Trileaflet; severely thickened, severely calcified leaflets. Valve mobility was restricted.  Doppler:   There was severe stenosis.   There was trivial regurgitation.    VTI ratio of LVOT to aortic valve: 0.16. Valve area (VTI): 0.5 cm^2. Indexed valve area (VTI): 0.25 cm^2/m^2. Peak velocity ratio of LVOT to aortic valve: 0.17. Valve area (Vmax): 0.52 cm^2. Indexed valve area (Vmax): 0.26 cm^2/m^2. Mean velocity ratio of LVOT to aortic valve: 0.17. Valve area (Vmean): 0.53 cm^2. Indexed valve area (Vmean): 0.26 cm^2/m^2.    Mean gradient (S): 59 mm Hg. Peak gradient (S): 107 mm Hg.  ------------------------------------------------------------------- Aorta:  Aortic root: The aortic root was normal in size.  ------------------------------------------------------------------- Mitral valve:   Calcified annulus. Mobility was not restricted. Doppler:  Transvalvular velocity was within the normal range. There was no evidence for stenosis. There was no regurgitation.     Peak gradient (D): 4 mm Hg.  ------------------------------------------------------------------- Left atrium:  The atrium was mildly dilated.  ------------------------------------------------------------------- Right ventricle:  The cavity size was normal. Wall thickness was normal. Systolic function was normal.  ------------------------------------------------------------------- Pulmonic valve:   Poorly visualized.  Structurally normal valve. Cusp separation was normal.  Doppler:  Transvalvular velocity was within the normal range. There was no evidence for stenosis. There was trivial regurgitation.  ------------------------------------------------------------------- Tricuspid valve:   Structurally normal valve.    Doppler: Transvalvular velocity was within the normal range. There was mild regurgitation.  ------------------------------------------------------------------- Pulmonary artery:   The main pulmonary artery was normal-sized. Systolic pressure was mildly to moderately increased.  ------------------------------------------------------------------- Right atrium:  The atrium was normal in size.  ------------------------------------------------------------------- Pericardium:  There was no pericardial effusion.  ------------------------------------------------------------------- Systemic veins: Inferior vena cava: The vessel was normal in size.  ------------------------------------------------------------------- Measurements   Left ventricle                            Value          Reference  LV ID, ED, PLAX chordal           (L)     41.5  mm       43 - 52  LV ID, ES, PLAX chordal                   28.1  mm       23 - 38  LV fx shortening, PLAX chordal            32    %        >=29  LV PW thickness, ED                       13.2  mm       ---------  IVS/LV PW ratio, ED                       1.04           <=1.3  Stroke volume, 2D                          74    ml       ---------  Stroke volume/bsa, 2D                     37    ml/m^2   ---------  LV ejection fraction, 1-p A4C             67    %        ---------  LV e&', lateral                            8.87  cm/s     ---------  LV E/e&', lateral                          11.61          ---------  LV e&', medial                             5.95  cm/s     ---------  LV E/e&', medial                           17.31          ---------  LV e&', average                            7.41  cm/s     ---------  LV E/e&', average                          13.9           ---------    Ventricular septum                        Value          Reference  IVS thickness, ED                         13.7  mm       ---------    LVOT                                      Value          Reference  LVOT ID, S                                20    mm       ---------  LVOT area                                 3.14  cm^2     ---------  LVOT ID                                   20    mm       ---------  LVOT peak velocity, S                     85.7  cm/s     ---------  LVOT mean velocity, S                     61.1  cm/s     ---------  LVOT VTI, S                               23.6  cm       ---------  LVOT peak gradient, S                     3     mm Hg    ---------  Stroke volume (SV), LVOT DP               74.1  ml       ---------  Stroke index (SV/bsa), LVOT DP            37.3  ml/m^2   ---------    Aortic valve                              Value          Reference  Aortic valve peak velocity, S             516   cm/s     ---------  Aortic valve mean velocity, S             365   cm/s     ---------  Aortic valve VTI, S                       148   cm       ---------  Aortic mean gradient, S                   59    mm Hg    ---------  Aortic peak gradient, S                   107   mm Hg    ---------  VTI ratio, LVOT/AV                        0.16           ---------  Aortic valve area, VTI                     0.5   cm^2     ---------  Aortic valve area/bsa, VTI                0.25  cm^2/m^2 ---------  Velocity ratio, peak, LVOT/AV             0.17           ---------  Aortic valve area, peak velocity          0.52  cm^2     ---------  Aortic  valve area/bsa, peak               0.26  cm^2/m^2 ---------  velocity  Velocity ratio, mean, LVOT/AV             0.17           ---------  Aortic valve area, mean velocity          0.53  cm^2     ---------  Aortic valve area/bsa, mean               0.26  cm^2/m^2 ---------  velocity  Aortic regurg pressure half-time          375   ms       ---------    Aorta                                     Value          Reference  Aortic root ID, ED                        33    mm       ---------  Ascending aorta ID, A-P, S                34    mm       ---------    Left atrium                               Value          Reference  LA ID, A-P, ES                            44    mm       ---------  LA ID/bsa, A-P                    (H)     2.21  cm/m^2   <=2.2  LA volume, S                              65    ml       ---------  LA volume/bsa, S                          32.7  ml/m^2   ---------  LA volume, ES, 1-p A4C                    58    ml       ---------  LA volume/bsa, ES, 1-p A4C                29.2  ml/m^2   ---------  LA volume, ES, 1-p A2C                    69    ml       ---------  LA volume/bsa, ES, 1-p A2C                34.7  ml/m^2   ---------    Mitral valve  Value          Reference  Mitral E-wave peak velocity               103   cm/s     ---------  Mitral A-wave peak velocity               97.7  cm/s     ---------  Mitral deceleration time                  225   ms       150 - 230  Mitral peak gradient, D                   4     mm Hg    ---------  Mitral E/A ratio, peak                    1.1            ---------    Pulmonary arteries                        Value          Reference  PA pressure, S, DP                 (H)     41    mm Hg    <=30    Tricuspid valve                           Value          Reference  Tricuspid regurg peak velocity            309   cm/s     ---------  Tricuspid peak RV-RA gradient             38    mm Hg    ---------    Systemic veins                            Value          Reference  Estimated CVP                             3     mm Hg    ---------    Right ventricle                           Value          Reference  RV pressure, S, DP                (H)     41    mm Hg    <=30  RV s&', lateral, S                         9.46  cm/s     ---------  Legend: (L)  and  (H)  mark values outside specified reference range.  ------------------------------------------------------------------- Prepared and Electronically Authenticated by  Donato Schultz, M.D. 2017-10-13T11:02:42  Physicians   Panel Physicians Referring Physician Case Authorizing Physician  Runell Gess, MD (Primary)    Procedures   Abdominal Aortogram  Right/Left Heart Cath and Coronary/Graft Angiography  Conclusion  Ost RCA to Mid RCA lesion, 100 %stenosed.  Ost Cx to Prox Cx lesion, 100 %stenosed.  Prox LAD to Mid LAD lesion, 90 %stenosed.  Insertion lesion, 75 %stenosed.   JONAS GOH is a 82 y.o. male    [id number] LOCATION:  FACILITY: [facility]  PHYSICIAN: Nanetta Batty, M.D. Jul 10, 1932   DATE OF PROCEDURE:  10/05/2016  DATE OF DISCHARGE:     CARDIAC CATHETERIZATION / PV Anio    History obtained from chart review.The patient is a very pleasant 82 year old, mildly overweight, married Caucasian male, father of 2, grandfather to 1 grandchild who I last saw 04/07/16. He is accompanied by his wife today. He has a history of CAD status post coronary artery bypass grafting in 2000, with a LIMA to his LAD, a vein graft to the first obtuse marginal branch, second obtuse marginal branch and to the RCA. His other problems include hypertension, hyperlipidemia and  noninsulin-requiring diabetes. He does have bilateral internal carotid artery stenosis left greater than right which has remained stable over the last 6 months. He is neurologically asymptomatic. He also has mild aortic stenosis with valve area of 1.4 cm squared. His last Myoview performed October 06, 2011, was low risk. He is asymptomatic. Dr. Sheria Lang follows his lipid profile. He has had progressive aortic stenosis with his most recent echo performed 02/28/16 were revealing preserved LV function . He also has moderate bilateral ICA stenosis by duplex ultrasound. Since I saw him in April of this year he's had progressive dyspnea on exertion. I suspect that his aortic stenosis has progressed. We will plan on performing right and left heart cardiac catheterization to define his anatomy and physiology in anticipation of potential TAVR.     IMPRESSION: Mr. Paluch grafts are patent. He does have a moderate anastomotic lesion of his RCA vein graft. I was unable to cross the valve because of the severity of stenosis. He has normal LV function by 2-D echo. I did do an abdominal aortogram documented the caliber of his distal aorta and iliac arteries and potential suitability for TAVR. The sheath will be removed and pressure held on the groin to achieve hemostasis. The patient left the lab in stable condition. I've discussed the case with Dr. Excell Seltzer who will see the patient prior to discharge tomorrow morning and evaluate suitability for TA VR.  Nanetta Batty. MD, [facility] 10/05/2016 4:05 PM      Indications   Critical aortic valve stenosis [I35.0 (ICD-10-CM)]  Coronary artery disease due to lipid rich plaque [I25.10, I25.83 (ICD-10-CM)]  Procedural Details/Technique   Technical Details PROCEDURE DESCRIPTION:   The patient was brought to the second floor [facility] cath lab in the postabsorptive state. He was not premedicated . His right groin was prepped and shaved in usual sterile  fashion. Xylocaine 1% was used for local anesthesia. A 5 French sheath was inserted into the right common femoral artery using standard Seldinger technique. A 7 French sheath was inserted into the right common femoral vein. 5 French right and left Judkins diagnostic catheters along the 5 French pigtail catheter were used for selective coronary angiography, selective vein graft and IMA graft angiography and distal abdominal aortography. Isovue dye was used for the entirety of the case. Retrograde aortic, left ventricular and pullback pressures were recorded.   Estimated blood loss <50 mL.  During this procedure no sedation was administered.    Coronary Findings   Dominance: Right  Left Anterior Descending  Prox LAD to Mid LAD lesion, 90%  stenosed.  Left Circumflex  Ost Cx to Prox Cx lesion, 100% stenosed.  Right Coronary Artery  Ost RCA to Mid RCA lesion, 100% stenosed.  Graft Angiography  Graft to 2nd Mrg  LIMA Graft to Dist LAD  Graft to 2nd Diag  Graft to Ost RPDA  Insertion lesion, 75% stenosed.  Right Heart   Right Heart Pressures Right atrial pressure-4/8, mean equals 2 Right ventricular pressure-33/0 Pulmonary artery pressure-30/11, mean equals 20 Pulmonary capillary wedge pressure-a with equals 12, V-wave equals 11, mean equals 9 Cardiac output by Fick-5.63 L/m Cardiac output by thermal-6.7 L/m    Coronary Diagrams   Diagnostic Diagram     Implants     No implant documentation for this case.  PACS Images   Show images for Cardiac catheterization   Link to Procedure Log   Procedure Log    Hemo Data   Flowsheet Row Most Recent Value  Fick Cardiac Output 5.63 L/min  Fick Cardiac Output Index 2.83 (L/min)/BSA  Thermal Cardiac Output 6.71 L/min  Thermal Cardiac Output Index 3.37 (L/min)/BSA  RA A Wave 4 mmHg  RA V Wave 8 mmHg  RA Mean 2 mmHg  RV Systolic Pressure 33 mmHg  RV Diastolic Pressure 0 mmHg  RV EDP 4 mmHg  PA Systolic Pressure 30 mmHg  PA  Diastolic Pressure 11 mmHg  PA Mean 20 mmHg  PW A Wave 12 mmHg  PW V Wave 11 mmHg  PW Mean 9 mmHg  AO Systolic Pressure 156 mmHg  AO Diastolic Pressure 66 mmHg  AO Mean 102 mmHg  TPVR Index 5.94 HRUI  TSVR Index 30.25 HRUI  PVR SVR Ratio 0.11  TPVR/TSVR Ratio 0.2   ADDENDUM REPORT: 10/13/2016 19:44  CLINICAL DATA:  82 year old male with severe aortic stenosis.  EXAM: Cardiac TAVR CT  TECHNIQUE: The patient was scanned on a Philips 256 scanner. A 120 kV retrospective scan was triggered in the descending thoracic aorta at 111 HU's. Gantry rotation speed was 270 msecs and collimation was .9 mm. No beta blockade or nitro were given. The 3D data set was reconstructed in 5% intervals of the R-R cycle. Systolic and diastolic phases were analyzed on a dedicated work station using MPR, MIP and VRT modes. The patient received 80 cc of contrast.  FINDINGS: Aortic Valve: Trileaflet, severely thickened and calcified aortic valve with mild calcifications extending into the LVOT predominantly under the non-coronary cusp.  Aorta:  Normal size, no dissection, moderate diffuse calcifications.  Sinotubular Junction:  27 x 27 mm  Ascending Thoracic Aorta:  32 X 30 mm  Aortic Arch:  26 x 24 mm  Descending Thoracic Aorta:  23 x 22 mm  Sinus of Valsalva Measurements:  Non-coronary:  35 mm  Right -coronary:  30 mm  Left -coronary:  33 mm  Coronary Artery Height above Annulus:  Left Main:  12 mm  Right Coronary:  17 mm  Virtual Basal Annulus Measurements:  Maximum/Minimum Diameter:  28 x 22 mm  Perimeter:  77 mm  Area:  445 mm2  Optimum Fluoroscopic Angle for Delivery:  RAO 1 CRA 1  Normal pulmonary vein drainage into the left atrium.  No thrombus in the left atrial appendage.  IMPRESSION: 1. Trileaflet, severely thickened and calcified aortic valve with mild calcifications extending into the LVOT predominantly under the non-coronary cusp.  Annular measurements suitable for delivery of a 26 mm Edwards-SAPIEN 3 TAVR valve.  2. Sufficient annulus to coronary distance.  3. Optimum Fluoroscopic Angle for Delivery:  RAO 1 CRA 1  Tobias Alexander   Electronically Signed   By: Tobias Alexander   On: 10/13/2016 19:44   Addended by Lars Masson, MD on 10/13/2016 7:46 PM    Study Result   EXAM: OVER-READ INTERPRETATION  CT CHEST  The following report is an over-read performed by radiologist Dr. Royal Piedra [facility] on 10/13/2016. This over-read does not include interpretation of cardiac or coronary anatomy or pathology. The coronary calcium score/coronary CTA interpretation by the cardiologist is attached.  COMPARISON:  None.  FINDINGS: Full description of extracardiac findings is under separate dictation for contemporaneously obtained CTA of the chest, abdomen and pelvis 10/13/2016.  IMPRESSION: Please see separate dictation for contemporaneously obtained CTA of the chest, abdomen and pelvis 10/13/2016 for full description of extracardiac findings.  Electronically Signed: By: Trudie Reed M.D. On: 10/13/2016 12:16     CLINICAL DATA:  82 year old male with history of severe aortic stenosis. Shortness of breath on exertion for some time. Preprocedural study prior to potential transcatheter aortic valve replacement (TAVR) procedure.  EXAM: CT ANGIOGRAPHY CHEST, ABDOMEN AND PELVIS  TECHNIQUE: Multidetector CT imaging through the chest, abdomen and pelvis was performed using the standard protocol during bolus administration of intravenous contrast. Multiplanar reconstructed images and MIPs were obtained and reviewed to evaluate the vascular anatomy.  CONTRAST:  70 mL of Isovue 370.  COMPARISON:  None.  FINDINGS: CTA CHEST FINDINGS  Cardiovascular: Heart size is mildly enlarged. There is no significant pericardial fluid, thickening or  pericardial calcification. There is aortic atherosclerosis, as well as atherosclerosis of the great vessels of the mediastinum and the coronary arteries, including calcified atherosclerotic plaque in the left main, left anterior descending, left circumflex and right coronary arteries. Status post median sternotomy for CABG, including LIMA to the LAD. Severe thickening calcification of the aortic valve.  Mediastinum/Lymph Nodes: No pathologically enlarged mediastinal or hilar lymph nodes. Esophagus is unremarkable in appearance. No axillary lymphadenopathy.  Lungs/Pleura: A few scattered tiny pulmonary nodules are noted in the lungs bilaterally, largest of which measures 5 mm in the left lower lobe (image 54 of series 407). No other larger more suspicious appearing pulmonary nodules or masses are noted. No acute consolidative airspace disease. No pleural effusions. Mild diffuse bronchial wall thickening with very mild centrilobular and paraseptal emphysema.  Musculoskeletal/Soft Tissues: There are no aggressive appearing lytic or blastic lesions noted in the visualized portions of the skeleton. Median sternotomy wires.  CTA ABDOMEN AND PELVIS FINDINGS  Hepatobiliary: No cystic or solid hepatic lesions. No intra or extrahepatic biliary ductal dilatation. Gallbladder is normal in appearance.  Pancreas: No pancreatic mass. No pancreatic ductal dilatation. No pancreatic or peripancreatic fluid or inflammatory changes.  Spleen: Unremarkable.  Adrenals/Urinary Tract: Bilateral adrenal glands and bilateral kidneys are normal in appearance. There is no hydroureteronephrosis or perinephric stranding to indicate urinary tract obstruction at this time. Urinary bladder is nearly decompressed, but otherwise unremarkable in appearance.  Stomach/Bowel: The appearance of the stomach is normal. There is no pathologic dilatation of small bowel or colon. Normal  appendix.  Vascular/Lymphatic: Aortic atherosclerosis, with vascular findings and measurements pertinent to potential TAVR procedure, as detailed below. No aneurysm or dissection identified in the abdominal or pelvic vasculature. Celiac axis, superior mesenteric artery and inferior mesenteric artery are all widely patent without definite hemodynamically significant stenosis. Single renal arteries bilaterally are both widely patent. No lymphadenopathy noted in the abdomen or pelvis.  Reproductive: Prostate gland and seminal vesicles are unremarkable in appearance.  Other: No significant volume of ascites.  No pneumoperitoneum.  Musculoskeletal: There are no aggressive appearing lytic or blastic lesions noted in the visualized portions of the skeleton.  VASCULAR MEASUREMENTS PERTINENT TO TAVR:  AORTA:  Minimal Aortic Diameter -  8 x 11 mm  Severity of Aortic Calcification -  severe  RIGHT PELVIS:  Right Common Iliac Artery -  Minimal Diameter - 6.5 x 2.6 mm  Tortuosity - mild  Calcification - severe  Right External Iliac Artery -  Minimal Diameter - 7.0 x 6.4 mm  Tortuosity - mild to moderate  Calcification - minimal  Right Common Femoral Artery -  Minimal Diameter - 7.4 x 6.1 mm  Tortuosity - mild  Calcification - mild  LEFT PELVIS:  Left Common Iliac Artery -  Minimal Diameter - 3.3 x 7.0 mm  Tortuosity - mild  Calcification - severe  Left External Iliac Artery -  Minimal Diameter - 6.7 x 7.0 mm  Tortuosity - mild  Calcification - minimal  Left Common Femoral Artery -  Minimal Diameter - 6.7 x 4.7 mm  Tortuosity - mild  Calcification - mild  Review of the MIP images confirms the above findings.  IMPRESSION: 1. Vascular findings and measurements pertinent to potential TAVR procedure, as detailed above. Secondary to severe calcification and small lumen and diameter in the common iliac arteries  bilaterally, this patient does not appear to have suitable pelvic arterial access. 2. Severe thickening and calcification of the aortic valve, compatible with the reported clinical history of severe aortic stenosis. 3. Small pulmonary nodules scattered throughout the lungs bilaterally measuring 5 mm or less in size. Noncontrast chest CT can be considered in 12 months in this high risk patient. This recommendation follows the consensus statement: Guidelines for Management of Incidental Pulmonary Nodules Detected on CT Images: From the Fleischner Society 2017; Radiology 2017; 284:228-243. 4. Aortic atherosclerosis, in addition to left main and 3 vessel coronary artery disease. Status post median sternotomy for CABG, including LIMA to the LAD. 5. Additional incidental findings, as above.   Electronically Signed   By: Trudie Reed M.D.   On: 10/13/2016 13:41  RISK SCORES About the STS Risk Calculator Procedure: AV Replacement  Risk of Mortality: 7.483%  Morbidity or Mortality: 33.28%  Long Length of Stay: 16.885%  Short Length of Stay: 13.761%  Permanent Stroke: 2.66%  Prolonged Ventilation: 26.878%  DSW Infection: 0.37%  Renal Failure: 14.477%  Reoperation: 10.228%   Impression:  This 82 year old gentleman has stage D severe symptomatic aortic stenosis with development of progressive exertional shortness of breath over the past 6 months. I have personally reviewed and interpreted his recent echo and cath films as well as his CT studies. He has a severely calcified trileaflet aortic valve with poor leaflet mobility and a mean gradient that has progressively increased to 59 mm Hg. His cath shows patent grafts with a 75% stenosis at the insertion site of his PDA vein graft. He has no chest pain and I think this can be managed medically. I think AVR is indicated in this patient who is now symptomatic and otherwise in good shape for his age and still wants to be active and  independent. The patient was counseled at length regarding treatment alternatives for management of severe symptomatic aortic stenosis. The risks and benefits of surgical intervention has been discussed in detail. Long-term prognosis with medical therapy was discussed. Alternative approaches such as conventional surgical aortic valve replacement, transcatheter aortic valve replacement, and palliative  medical therapy were compared and contrasted at length. This discussion was placed in the context of the patient's own specific clinical presentation and past medical history. I think he would be a high risk patient for open surgical AVR given his prior CABG and advanced age. I think TAVR would be a much better option for him.  His cardiac CT shows that he has anatomy favorable for TAVR using a Sapien 3 valve. His abdominal and pelvic CT shows severe concentric iliac calcified plaque with a small lumen and I doubt that he has adequate access for a transfemoral approach. I think he will likely require transapical access.   Following the decision to proceed with transcatheter aortic valve replacement, a discussion was held regarding what types of management strategies would be attempted intraoperatively in the event of life-threatening complications, including whether or not the patient would be considered a candidate for the use of cardiopulmonary bypass and/or conversion to open sternotomy for attempted surgical intervention. The patient is aware of the fact that transient use of cardiopulmonary bypass may be necessary but understands that he would not be a candidate for emergent redo sternotomy for management of aortic or valve-related complications. The patient has been advised of a variety of complications that might develop including but not limited to risks of death, stroke, paravalvular leak, aortic dissection or other major vascular complications, aortic annulus rupture, device embolization, cardiac rupture  or perforation, mitral regurgitation, acute myocardial infarction, arrhythmia, heart block or bradycardia requiring permanent pacemaker placement, congestive heart failure, respiratory failure, renal failure, pneumonia, infection, other late complications related to structural valve deterioration or migration, or other complications that might ultimately cause a temporary or permanent loss of functional independence or other long term morbidity. The patient provides full informed consent for the procedure as described and all questions were answered.    Plan:  He will return to see Dr. Cornelius Moras for a second surgical opinion before scheduling TAVR and understands that the decision to proceed with TAVR will not be final until he has been evaluated by Dr. Cornelius Moras and discussed with the structural heart valve team.   I spent 60 minutes performing this consultation and > 50% of this time was spent face to face counseling and coordinating the care of this patient's severe aortic stenosis.  Alleen Borne, MD

## 2016-10-20 ENCOUNTER — Other Ambulatory Visit: Payer: Self-pay | Admitting: *Deleted

## 2016-10-20 DIAGNOSIS — I350 Nonrheumatic aortic (valve) stenosis: Secondary | ICD-10-CM

## 2016-10-27 ENCOUNTER — Encounter: Payer: Medicare Other | Admitting: Thoracic Surgery (Cardiothoracic Vascular Surgery)

## 2016-10-29 ENCOUNTER — Encounter: Payer: Self-pay | Admitting: Thoracic Surgery (Cardiothoracic Vascular Surgery)

## 2016-10-29 ENCOUNTER — Institutional Professional Consult (permissible substitution) (INDEPENDENT_AMBULATORY_CARE_PROVIDER_SITE_OTHER): Payer: Medicare Other | Admitting: Thoracic Surgery (Cardiothoracic Vascular Surgery)

## 2016-10-29 VITALS — BP 135/72 | HR 70 | Resp 20 | Ht 71.0 in | Wt 174.0 lb

## 2016-10-29 DIAGNOSIS — I350 Nonrheumatic aortic (valve) stenosis: Secondary | ICD-10-CM

## 2016-10-29 DIAGNOSIS — I2510 Atherosclerotic heart disease of native coronary artery without angina pectoris: Secondary | ICD-10-CM

## 2016-10-29 NOTE — Pre-Procedure Instructions (Signed)
Doyce LooseOdell C Arrowood  10/29/2016      [facility] [place] [address] [place] [id number] Phone: [phone] Fax: [phone]  [facility] [address] - [place], [place] - [address] [facility] [address] Phone: [phone] Fax: [phone]    Your procedure is scheduled on Tuesday November 14.  [facility]  Call this number if you have problems the morning of surgery:  [phone]   Remember:  Do not eat food or drink liquids after midnight.  Take these medicines the morning of surgery with A SIP OF WATER: amlodipine (norvasc), levothyroxine (synthroid)  7 days prior to surgery STOP taking any Plavix, Aspirin, Aleve, Naproxen, Ibuprofen, Motrin, Advil, Goody's, BC's, all herbal medications, fish oil, and all vitamins  WHAT DO I DO ABOUT MY DIABETES MEDICATION?   Marland Kitchen. Do not take oral diabetes medicines (pills) the morning of surgery. DO NOT TAKE metformin (Glucophage) the day of surgery      How to Manage Your Diabetes Before and After Surgery  Why is it important to control my blood sugar before and after surgery? . Improving blood sugar levels before and after surgery helps healing and can limit problems. . A way of improving blood sugar control is eating a healthy diet by: o  Eating less sugar and carbohydrates o  Increasing activity/exercise o  Talking with your doctor about reaching your blood sugar goals . High blood sugars (greater than 180 mg/dL) can raise your risk of infections and slow your recovery, so you will need to focus on controlling your diabetes during the weeks before surgery. . Make sure that the doctor who takes care of your diabetes knows about your planned surgery including the date and location.  How do I manage my blood sugar before surgery? . Check your blood sugar at least 4 times a day, starting 2  days before surgery, to make sure that the level is not too high or low. o Check your blood sugar the morning of your surgery when you wake up and every 2 hours until you get to the Short Stay unit. . If your blood sugar is less than 70 mg/dL, you will need to treat for low blood sugar: o Do not take insulin. o Treat a low blood sugar (less than 70 mg/dL) with  cup of clear juice (cranberry or apple), 4 glucose tablets, OR glucose gel. o Recheck blood sugar in 15 minutes after treatment (to make sure it is greater than 70 mg/dL). If your blood sugar is not greater than 70 mg/dL on recheck, call [phone] for further instructions. . Report your blood sugar to the short stay nurse when you get to Short Stay.  . If you are admitted to the hospital after surgery: o Your blood sugar will be checked by the staff and you will probably be given insulin after surgery (instead of oral diabetes medicines) to make sure you have good blood sugar levels. o The goal for blood sugar control after surgery is 80-180 mg/dL.                Do not wear jewelry, make-up or nail polish.  Do not wear lotions, powders, or perfumes, or deoderant.  Do not shave 48 hours prior to surgery.  Men may shave face and neck.  Do not bring valuables to the hospital.  [facility] is not  responsible for any belongings or valuables.  Contacts, dentures or bridgework may not be worn into surgery.  Leave your suitcase in the car.  After surgery it may be brought to your room.  For patients admitted to the hospital, discharge time will be determined by your treatment team.  Patients discharged the day of surgery will not be allowed to drive home.    Special instructions:    [place]- Preparing For Surgery  Before surgery, you can play an important role. Because skin is not sterile, your skin needs to be as free of germs as possible. You can reduce the number of germs on your skin by washing with CHG  (chlorahexidine gluconate) Soap before surgery.  CHG is an antiseptic cleaner which kills germs and bonds with the skin to continue killing germs even after washing.  Please do not use if you have an allergy to CHG or antibacterial soaps. If your skin becomes reddened/irritated stop using the CHG.  Do not shave (including legs and underarms) for at least 48 hours prior to first CHG shower. It is OK to shave your face.  Please follow these instructions carefully.   1. Shower the NIGHT BEFORE SURGERY and the MORNING OF SURGERY with CHG.   2. If you chose to wash your hair, wash your hair first as usual with your normal shampoo.  3. After you shampoo, rinse your hair and body thoroughly to remove the shampoo.  4. Use CHG as you would any other liquid soap. You can apply CHG directly to the skin and wash gently with a scrungie or a clean washcloth.   5. Apply the CHG Soap to your body ONLY FROM THE NECK DOWN.  Do not use on open wounds or open sores. Avoid contact with your eyes, ears, mouth and genitals (private parts). Wash genitals (private parts) with your normal soap.  6. Wash thoroughly, paying special attention to the area where your surgery will be performed.  7. Thoroughly rinse your body with warm water from the neck down.  8. DO NOT shower/wash with your normal soap after using and rinsing off the CHG Soap.  9. Pat yourself dry with a CLEAN TOWEL.   10. Wear CLEAN PAJAMAS   11. Place CLEAN SHEETS on your bed the night of your first shower and DO NOT SLEEP WITH PETS.    Day of Surgery: Do not apply any deodorants/lotions. Please wear clean clothes to the hospital/surgery center.      Please read over the following fact sheets that you were given. MRSA Information

## 2016-10-29 NOTE — Progress Notes (Signed)
[facility]  MULTIDISCIPLINARY HEART VALVE CLINIC  CARDIOTHORACIC SURGERY CONSULTATION REPORT  Referring Provider is Runell Gess, MD PCP is Maryjean Ka, MD  Chief Complaint  Patient presents with  . Aortic Stenosis    2nd TAVR eval, review all studies, Surgery scheduled for 11/03/16    HPI:  Patient is an 82 year old male with history of aortic stenosis, coronary artery disease status post coronary artery bypass grafting in the remote past, hypertension, hyperlipidemia, and type 2 diabetes mellitus who has been referred for second surgical opinion to discuss treatment options for management of severe symptomatic aortic stenosis. The patient's cardiac history dates back to 2000 when he presented with symptomatic coronary artery disease.  He underwent coronary artery bypass grafting by Dr. Laneta Simmers at that time and recovered uneventfully. He has been followed ever since by Dr. Allyson Sabal in overall he has done remarkably well from a cardiac standpoint. He was noted to have a murmur on physical exam in over the years he has developed aortic stenosis that has progressed in severity on serial follow-up echocardiograms. He has remained active physically and asymptomatic until recently. Over the past year the patient reports development of symptoms of exertional shortness of breath and fatigue. Follow-up echocardiogram performed 10/02/2016 revealed normal left ventricular systolic function with peak velocity across the aortic valve measured greater than 5 m/s corresponded to mean transvalvular gradient estimated 59 mmHg.  The patient underwent diagnostic cardiac catheterization on 10/05/2016 that revealed severe native coronary artery disease but continued patency of bypass grafts to all major coronary artery distributions.  The patient was referred for surgical consultation and evaluated by Dr. Laneta Simmers on 10/14/2016. He has undergone CT angiography to further characterize the  feasibility of transcatheter aortic valve replacement as an alternative to high risk conventional surgery. The patient presents to the office today for second surgical opinion.  The patient is married and lives with his wife in [place].  He has been retired for 9 years but has remained physically active throughout retirement. He drives an automobile and remains completely functionally independent. He states that over the past year he has developed symptoms of exertional shortness of breath. Symptoms only occur with more strenuous activities such as walking up a hill and taking out the trash. He gets tired more easily than he used to and he has started to cut back on his activities. He denies any history of resting shortness of breath, PND, orthopnea, or lower extremity edema. He has not had any substernal chest pain or chest tightness either with activity or at rest. He denies any history of dizzy spells or syncope.  Past Medical History:  Diagnosis Date  . Aortic stenosis   . Arthritis   . CAD (coronary artery disease)    a. CAD s/p CABG 2000   b. cath on 10/05/16 with severe native vessel CAD and patent bypass grafts  . Carotid stenosis    bilateral internal, left greater than right, stable  . Diabetes mellitus type 2, noninsulin dependent (HCC)   . Hyperlipidemia   . Hypertension     Past Surgical History:  Procedure Laterality Date  . CARDIAC CATHETERIZATION  11/10/1999   3-vessel disease, recommendation for CABG  . CARDIAC CATHETERIZATION N/A 10/05/2016   Procedure: Right/Left Heart Cath and Coronary/Graft Angiography;  Surgeon: Runell Gess, MD;  Location: [facility] INVASIVE CV LAB;  Service: Cardiovascular;  Laterality: N/A;  . CORONARY ARTERY BYPASS GRAFT  11/13/1999   LIMA to LAD and a vein graft to  the first OM branch, second OM branch, and to the RCA (Dr. Wayland SalinasB. Bartle)  . NM MYOCAR PERF WALL MOTION  09/2013   normal lexiscan myoview  . PERIPHERAL VASCULAR CATHETERIZATION N/A  10/05/2016   Procedure: Abdominal Aortogram;  Surgeon: Runell GessJonathan J Berry, MD;  Location: [facility] INVASIVE CV LAB;  Service: Cardiovascular;  Laterality: N/A;  . TRANSTHORACIC ECHOCARDIOGRAM  12/2013   EF 55-60%, grade 1 diastolic dysfunction, high ventricular filling pressure; severe AV stenosis; calcified MV annulus; LA & RA mildly dilated; atrial septal aneurysm    Family History  Problem Relation Age of Onset  . Lung disease Mother   . Colon cancer Father   . Diabetes Sister   . CAD Sister     Social History   Social History  . Marital status: Married    Spouse name: N/A  . Number of children: 2  . Years of education: GED   Occupational History  . Not on file.   Social History Main Topics  . Smoking status: Former Smoker    Packs/day: 1.00    Years: 5.00    Types: Cigarettes    Quit date: 07/20/1973  . Smokeless tobacco: Never Used  . Alcohol use No  . Drug use: No  . Sexual activity: Not on file   Other Topics Concern  . Not on file   Social History Narrative  . No narrative on file    Current Outpatient Prescriptions  Medication Sig Dispense Refill  . amLODipine (NORVASC) 10 MG tablet Take 10 mg by mouth daily with breakfast.   6  . benazepril (LOTENSIN) 20 MG tablet Take 20 mg by mouth daily with breakfast.   6  . carvedilol (COREG) 6.25 MG tablet Take 6.25 mg by mouth 2 (two) times daily with a meal.    . clopidogrel (PLAVIX) 75 MG tablet Take 75 mg by mouth daily.     Marland Kitchen. levothyroxine (SYNTHROID, LEVOTHROID) 88 MCG tablet Take 88 mcg by mouth every evening.   0  . Lutein 6 MG CAPS Take 6 mg by mouth daily with breakfast.     . metFORMIN (GLUCOPHAGE) 500 MG tablet Take 500 mg by mouth daily with breakfast.    . simvastatin (ZOCOR) 20 MG tablet Take 20 mg by mouth every evening.      No current facility-administered medications for this visit.     No Known Allergies    Review of Systems:   General:  normal appetite, decreased energy, no weight gain, no  weight loss, no fever  Cardiac:  no chest pain with exertion, no chest pain at rest, + SOB with exertion, no resting SOB, no PND, no orthopnea, no palpitations, no arrhythmia, no atrial fibrillation, no LE edema, no dizzy spells, no syncope  Respiratory:  exertional shortness of breath, no home oxygen, no productive cough, no dry cough, no bronchitis, no wheezing, no hemoptysis, no asthma, no pain with inspiration or cough, no sleep apnea, no CPAP at night  GI:   no difficulty swallowing, no reflux, no frequent heartburn, no hiatal hernia, no abdominal pain, no constipation, no diarrhea, no hematochezia, no hematemesis, no melena  GU:   no dysuria,  no frequency, no urinary tract infection, no hematuria, no enlarged prostate, no kidney stones, no kidney disease  Vascular:  no pain suggestive of claudication, no pain in feet, occasional leg cramps, no varicose veins, no DVT, no non-healing foot ulcer  Neuro:   no stroke, no TIA's, no seizures, no headaches, no temporary  blindness one eye,  no slurred speech, no peripheral neuropathy, no chronic pain, no instability of gait, no memory/cognitive dysfunction  Musculoskeletal: minimal arthritis, no joint swelling, no myalgias, no difficulty walking, normal mobility   Skin:   no rash, no itching, no skin infections, no pressure sores or ulcerations  Psych:   no anxiety, no depression, no nervousness, no unusual recent stress  Eyes:   no blurry vision, no floaters, no recent vision changes, + wears glasses or contacts  ENT:   + hearing loss, no loose or painful teeth, no dentures, last saw dentist 08/26/2016  Hematologic:  + easy bruising, + abnormal bleeding, no clotting disorder, no frequent epistaxis  Endocrine:  + diabetes, does not check CBG's at home           Physical Exam:   BP 135/72 (BP Location: Right Arm, Patient Position: Sitting, Cuff Size: Normal)   Pulse 70   Resp 20   Ht 5\' 11"  (1.803 m)   Wt 174 lb (78.9 kg)   SpO2 99% Comment: RA   BMI 24.27 kg/m   General:  Elderly,  well-appearing  HEENT:  Unremarkable   Neck:   no JVD, no bruits, no adenopathy   Chest:   clear to auscultation, symmetrical breath sounds, no wheezes, no rhonchi   CV:   RRR, grade IV/VI crescendo/decrescendo murmur heard best at RUSB,  no diastolic murmur  Abdomen:  soft, non-tender, no masses   Extremities:  warm, well-perfused, pulses diminished but palpable, no LE edema  Rectal/GU  Deferred  Neuro:   Grossly non-focal and symmetrical throughout  Skin:   Clean and dry, no rashes, no breakdown   Diagnostic Tests:  Transthoracic Echocardiography  Patient:    Dayven, Linsley MR #:       [id number] Study Date: 10/02/2016 Gender:     M Age:        26 Height:     180.3 cm Weight:     78.7 kg BSA:        1.99 m^2 Pt. Status: Room:   ORDERING     Nanetta Batty, MD  REFERRING    Nanetta Batty, MD  SONOGRAPHER  [place], Will  ATTENDING    Donato Schultz, M.D.  PERFORMING   [facility], Outpatient  cc:  ------------------------------------------------------------------- LV EF: 60% -   65%  ------------------------------------------------------------------- Indications:      (I35.0).  ------------------------------------------------------------------- History:   PMH:  Acquired from the patient and from the patient&'s chart.  Coronary artery disease.  Severe aortic stenosis.  Risk factors:  Former tobacco use. Hypertension. Dyslipidemia.  ------------------------------------------------------------------- Study Conclusions  - Left ventricle: The cavity size was normal. There was mild   concentric hypertrophy. Systolic function was normal. The   estimated ejection fraction was in the range of 60% to 65%. Wall   motion was normal; there were no regional wall motion   abnormalities. Doppler parameters are consistent with abnormal   left ventricular relaxation (grade 1 diastolic dysfunction). - Aortic valve: Valve mobility was  restricted. There was severe   stenosis. There was trivial regurgitation. Peak velocity (S): 516   cm/s. Mean gradient (S): 59 mm Hg. Valve area (Vmean): 0.53 cm^2. - Mitral valve: Calcified annulus. - Left atrium: The atrium was mildly dilated. Anterior-posterior   dimension: 44 mm. - Pulmonary arteries: Systolic pressure was mildly to moderately   increased. PA peak pressure: 41 mm Hg (S).  Impressions:  - Severe aortic stenosis (Prior mean gradient ).  ------------------------------------------------------------------- Labs,  prior tests, procedures, and surgery: Coronary artery bypass grafting.  ------------------------------------------------------------------- Study data:  Comparison was made to the study of 02/28/2016.  Study status:  Routine.  Procedure:  The patient reported no pain pre or post test. Transthoracic echocardiography for left ventricular function evaluation and for assessment of valvular function. Image quality was adequate.  Study completion:  There were no complications.          Transthoracic echocardiography.  M-mode, complete 2D, spectral Doppler, and color Doppler.  Birthdate: Patient birthdate: 10-05-1932.  Age:  Patient is 82 yr old.  Sex: Gender: male.    BMI: 24.2 kg/m^2.  Blood pressure:     153/67 Patient status:  Outpatient.  Study date:  Study date: 10/02/2016. Study time: 07:45 AM.  Location:  [facility]  -------------------------------------------------------------------  ------------------------------------------------------------------- Left ventricle:  The cavity size was normal. There was mild concentric hypertrophy. Systolic function was normal. The estimated ejection fraction was in the range of 60% to 65%. Wall motion was normal; there were no regional wall motion abnormalities. Doppler parameters are consistent with abnormal left ventricular relaxation (grade 1 diastolic  dysfunction).  ------------------------------------------------------------------- Aortic valve:   Trileaflet; severely thickened, severely calcified leaflets. Valve mobility was restricted.  Doppler:   There was severe stenosis.   There was trivial regurgitation.    VTI ratio of LVOT to aortic valve: 0.16. Valve area (VTI): 0.5 cm^2. Indexed valve area (VTI): 0.25 cm^2/m^2. Peak velocity ratio of LVOT to aortic valve: 0.17. Valve area (Vmax): 0.52 cm^2. Indexed valve area (Vmax): 0.26 cm^2/m^2. Mean velocity ratio of LVOT to aortic valve: 0.17. Valve area (Vmean): 0.53 cm^2. Indexed valve area (Vmean): 0.26 cm^2/m^2.    Mean gradient (S): 59 mm Hg. Peak gradient (S): 107 mm Hg.  ------------------------------------------------------------------- Aorta:  Aortic root: The aortic root was normal in size.  ------------------------------------------------------------------- Mitral valve:   Calcified annulus. Mobility was not restricted. Doppler:  Transvalvular velocity was within the normal range. There was no evidence for stenosis. There was no regurgitation.    Peak gradient (D): 4 mm Hg.  ------------------------------------------------------------------- Left atrium:  The atrium was mildly dilated.  ------------------------------------------------------------------- Right ventricle:  The cavity size was normal. Wall thickness was normal. Systolic function was normal.  ------------------------------------------------------------------- Pulmonic valve:   Poorly visualized.  Structurally normal valve. Cusp separation was normal.  Doppler:  Transvalvular velocity was within the normal range. There was no evidence for stenosis. There was trivial regurgitation.  ------------------------------------------------------------------- Tricuspid valve:   Structurally normal valve.    Doppler: Transvalvular velocity was within the normal range. There was  mild regurgitation.  ------------------------------------------------------------------- Pulmonary artery:   The main pulmonary artery was normal-sized. Systolic pressure was mildly to moderately increased.  ------------------------------------------------------------------- Right atrium:  The atrium was normal in size.  ------------------------------------------------------------------- Pericardium:  There was no pericardial effusion.  ------------------------------------------------------------------- Systemic veins: Inferior vena cava: The vessel was normal in size.  ------------------------------------------------------------------- Measurements   Left ventricle                            Value          Reference  LV ID, ED, PLAX chordal           (L)     41.5  mm       43 - 52  LV ID, ES, PLAX chordal                   28.1  mm       23 - 38  LV fx shortening, PLAX chordal            32    %        >=29  LV PW thickness, ED                       13.2  mm       ---------  IVS/LV PW ratio, ED                       1.04           <=1.3  Stroke volume, 2D                         74    ml       ---------  Stroke volume/bsa, 2D                     37    ml/m^2   ---------  LV ejection fraction, 1-p A4C             67    %        ---------  LV e&', lateral                            8.87  cm/s     ---------  LV E/e&', lateral                          11.61          ---------  LV e&', medial                             5.95  cm/s     ---------  LV E/e&', medial                           17.31          ---------  LV e&', average                            7.41  cm/s     ---------  LV E/e&', average                          13.9           ---------    Ventricular septum                        Value          Reference  IVS thickness, ED                         13.7  mm       ---------    LVOT                                      Value          Reference  LVOT ID, S  20    mm       ---------  LVOT area                                 3.14  cm^2     ---------  LVOT ID                                   20    mm       ---------  LVOT peak velocity, S                     85.7  cm/s     ---------  LVOT mean velocity, S                     61.1  cm/s     ---------  LVOT VTI, S                               23.6  cm       ---------  LVOT peak gradient, S                     3     mm Hg    ---------  Stroke volume (SV), LVOT DP               74.1  ml       ---------  Stroke index (SV/bsa), LVOT DP            37.3  ml/m^2   ---------    Aortic valve                              Value          Reference  Aortic valve peak velocity, S             516   cm/s     ---------  Aortic valve mean velocity, S             365   cm/s     ---------  Aortic valve VTI, S                       148   cm       ---------  Aortic mean gradient, S                   59    mm Hg    ---------  Aortic peak gradient, S                   107   mm Hg    ---------  VTI ratio, LVOT/AV                        0.16           ---------  Aortic valve area, VTI                    0.5   cm^2     ---------  Aortic valve area/bsa, VTI                0.25  cm^2/m^2 ---------  Velocity ratio, peak, LVOT/AV             0.17           ---------  Aortic valve area, peak velocity          0.52  cm^2     ---------  Aortic valve area/bsa, peak               0.26  cm^2/m^2 ---------  velocity  Velocity ratio, mean, LVOT/AV             0.17           ---------  Aortic valve area, mean velocity          0.53  cm^2     ---------  Aortic valve area/bsa, mean               0.26  cm^2/m^2 ---------  velocity  Aortic regurg pressure half-time          375   ms       ---------    Aorta                                     Value          Reference  Aortic root ID, ED                        33    mm       ---------  Ascending aorta ID, A-P, S                34    mm       ---------    Left  atrium                               Value          Reference  LA ID, A-P, ES                            44    mm       ---------  LA ID/bsa, A-P                    (H)     2.21  cm/m^2   <=2.2  LA volume, S                              65    ml       ---------  LA volume/bsa, S                          32.7  ml/m^2   ---------  LA volume, ES, 1-p A4C                    58    ml       ---------  LA volume/bsa, ES, 1-p A4C                29.2  ml/m^2   ---------  LA volume, ES, 1-p A2C                    69  ml       ---------  LA volume/bsa, ES, 1-p A2C                34.7  ml/m^2   ---------    Mitral valve                              Value          Reference  Mitral E-wave peak velocity               103   cm/s     ---------  Mitral A-wave peak velocity               97.7  cm/s     ---------  Mitral deceleration time                  225   ms       150 - 230  Mitral peak gradient, D                   4     mm Hg    ---------  Mitral E/A ratio, peak                    1.1            ---------    Pulmonary arteries                        Value          Reference  PA pressure, S, DP                (H)     41    mm Hg    <=30    Tricuspid valve                           Value          Reference  Tricuspid regurg peak velocity            309   cm/s     ---------  Tricuspid peak RV-RA gradient             38    mm Hg    ---------    Systemic veins                            Value          Reference  Estimated CVP                             3     mm Hg    ---------    Right ventricle                           Value          Reference  RV pressure, S, DP                (H)     41    mm Hg    <=30  RV s&', lateral, S                         9.46  cm/s     ---------  Legend: (L)  and  (H)  mark values outside specified reference range.  ------------------------------------------------------------------- Prepared and Electronically Authenticated by  Donato Schultz,  M.D. 2017-10-13T11:02:42   CARDIAC CATHETERIZATION / PV Anio    History obtained from chart review.The patient is a very pleasant 82 year old, mildly overweight, married Caucasian male, father of 2, grandfather to 1 grandchild who I last saw 04/07/16. He is accompanied by his wife today. He has a history of CAD status post coronary artery bypass grafting in 2000, with a LIMA to his LAD, a vein graft to the first obtuse marginal branch, second obtuse marginal branch and to the RCA. His other problems include hypertension, hyperlipidemia and noninsulin-requiring diabetes. He does have bilateral internal carotid artery stenosis left greater than right which has remained stable over the last 6 months. He is neurologically asymptomatic. He also has mild aortic stenosis with valve area of 1.4 cm squared. His last Myoview performed October 06, 2011, was low risk. He is asymptomatic. Dr. Sheria Lang follows his lipid profile. He has had progressive aortic stenosis with his most recent echo performed 02/28/16 were revealing preserved LV function . He also has moderate bilateral ICA stenosis by duplex ultrasound. Since I saw him in April of this year he's had progressive dyspnea on exertion. I suspect that his aortic stenosis has progressed. We will plan on performing right and left heart cardiac catheterization to define his anatomy and physiology in anticipation of potential TAVR.     IMPRESSION: Mr. Alcala grafts are patent. He does have a moderate anastomotic lesion of his RCA vein graft. I was unable to cross the valve because of the severity of stenosis. He has normal LV function by 2-D echo. I did do an abdominal aortogram documented the caliber of his distal aorta and iliac arteries and potential suitability for TAVR. The sheath will be removed and pressure held on the groin to achieve hemostasis. The patient left the lab in stable condition. I've discussed the case with Dr. Excell Seltzer who will see the  patient prior to discharge tomorrow morning and evaluate suitability for TA VR.  Nanetta Batty. MD, [facility] 10/05/2016 4:05 PM      Indications   Critical aortic valve stenosis [I35.0 (ICD-10-CM)]  Coronary artery disease due to lipid rich plaque [I25.10, I25.83 (ICD-10-CM)]  Procedural Details/Technique   Technical Details PROCEDURE DESCRIPTION:   The patient was brought to the second floor [facility] cath lab in the postabsorptive state. He was not premedicated . His right groin was prepped and shaved in usual sterile fashion. Xylocaine 1% was used for local anesthesia. A 5 French sheath was inserted into the right common femoral artery using standard Seldinger technique. A 7 French sheath was inserted into the right common femoral vein. 5 French right and left Judkins diagnostic catheters along the 5 French pigtail catheter were used for selective coronary angiography, selective vein graft and IMA graft angiography and distal abdominal aortography. Isovue dye was used for the entirety of the case. Retrograde aortic, left ventricular and pullback pressures were recorded.   Estimated blood loss <50 mL.  During this procedure no sedation was administered.    Coronary Findings   Dominance: Right  Left Anterior Descending  Prox LAD to Mid LAD lesion, 90% stenosed.  Left Circumflex  Ost Cx to Prox Cx lesion, 100% stenosed.  Right Coronary Artery  Ost RCA to Mid RCA lesion, 100% stenosed.  Graft Angiography  Graft to 2nd Mrg  LIMA  Graft to Dist LAD  Graft to 2nd Diag  Graft to Ost RPDA  Insertion lesion, 75% stenosed.  Right Heart   Right Heart Pressures Right atrial pressure-4/8, mean equals 2 Right ventricular pressure-33/0 Pulmonary artery pressure-30/11, mean equals 20 Pulmonary capillary wedge pressure-a with equals 12, V-wave equals 11, mean equals 9 Cardiac output by Fick-5.63 L/m Cardiac output by thermal-6.7 L/m    Coronary Diagrams   Diagnostic  Diagram     Implants     No implant documentation for this case.  PACS Images   Show images for Cardiac catheterization   Link to Procedure Log   Procedure Log    Hemo Data   Flowsheet Row Most Recent Value  Fick Cardiac Output 5.63 L/min  Fick Cardiac Output Index 2.83 (L/min)/BSA  Thermal Cardiac Output 6.71 L/min  Thermal Cardiac Output Index 3.37 (L/min)/BSA  RA A Wave 4 mmHg  RA V Wave 8 mmHg  RA Mean 2 mmHg  RV Systolic Pressure 33 mmHg  RV Diastolic Pressure 0 mmHg  RV EDP 4 mmHg  PA Systolic Pressure 30 mmHg  PA Diastolic Pressure 11 mmHg  PA Mean 20 mmHg  PW A Wave 12 mmHg  PW V Wave 11 mmHg  PW Mean 9 mmHg  AO Systolic Pressure 156 mmHg  AO Diastolic Pressure 66 mmHg  AO Mean 102 mmHg  TPVR Index 5.94 HRUI  TSVR Index 30.25 HRUI  PVR SVR Ratio 0.11  TPVR/TSVR Ratio 0.2    Cardiac TAVR CT  TECHNIQUE: The patient was scanned on a Philips 256 scanner. A 120 kV retrospective scan was triggered in the descending thoracic aorta at 111 HU's. Gantry rotation speed was 270 msecs and collimation was .9 mm. No beta blockade or nitro were given. The 3D data set was reconstructed in 5% intervals of the R-R cycle. Systolic and diastolic phases were analyzed on a dedicated work station using MPR, MIP and VRT modes. The patient received 80 cc of contrast.  FINDINGS: Aortic Valve: Trileaflet, severely thickened and calcified aortic valve with mild calcifications extending into the LVOT predominantly under the non-coronary cusp.  Aorta:  Normal size, no dissection, moderate diffuse calcifications.  Sinotubular Junction:  27 x 27 mm  Ascending Thoracic Aorta:  32 X 30 mm  Aortic Arch:  26 x 24 mm  Descending Thoracic Aorta:  23 x 22 mm  Sinus of Valsalva Measurements:  Non-coronary:  35 mm  Right -coronary:  30 mm  Left -coronary:  33 mm  Coronary Artery Height above Annulus:  Left Main:  12 mm  Right Coronary:  17 mm  Virtual  Basal Annulus Measurements:  Maximum/Minimum Diameter:  28 x 22 mm  Perimeter:  77 mm  Area:  445 mm2  Optimum Fluoroscopic Angle for Delivery:  RAO 1 CRA 1  Normal pulmonary vein drainage into the left atrium.  No thrombus in the left atrial appendage.  IMPRESSION: 1. Trileaflet, severely thickened and calcified aortic valve with mild calcifications extending into the LVOT predominantly under the non-coronary cusp. Annular measurements suitable for delivery of a 26 mm Edwards-SAPIEN 3 TAVR valve.  2. Sufficient annulus to coronary distance.  3. Optimum Fluoroscopic Angle for Delivery:  RAO 1 CRA 1  Tobias AlexanderKatarina Nelson   Electronically Signed   By: Tobias AlexanderKatarina  Nelson   On: 10/13/2016 19:44    CT ANGIOGRAPHY CHEST, ABDOMEN AND PELVIS  TECHNIQUE: Multidetector CT imaging through the chest, abdomen and pelvis was performed using the standard protocol during bolus administration of  intravenous contrast. Multiplanar reconstructed images and MIPs were obtained and reviewed to evaluate the vascular anatomy.  CONTRAST:  70 mL of Isovue 370.  COMPARISON:  None.  FINDINGS: CTA CHEST FINDINGS  Cardiovascular: Heart size is mildly enlarged. There is no significant pericardial fluid, thickening or pericardial calcification. There is aortic atherosclerosis, as well as atherosclerosis of the great vessels of the mediastinum and the coronary arteries, including calcified atherosclerotic plaque in the left main, left anterior descending, left circumflex and right coronary arteries. Status post median sternotomy for CABG, including LIMA to the LAD. Severe thickening calcification of the aortic valve.  Mediastinum/Lymph Nodes: No pathologically enlarged mediastinal or hilar lymph nodes. Esophagus is unremarkable in appearance. No axillary lymphadenopathy.  Lungs/Pleura: A few scattered tiny pulmonary nodules are noted in the lungs bilaterally, largest of which  measures 5 mm in the left lower lobe (image 54 of series 407). No other larger more suspicious appearing pulmonary nodules or masses are noted. No acute consolidative airspace disease. No pleural effusions. Mild diffuse bronchial wall thickening with very mild centrilobular and paraseptal emphysema.  Musculoskeletal/Soft Tissues: There are no aggressive appearing lytic or blastic lesions noted in the visualized portions of the skeleton. Median sternotomy wires.  CTA ABDOMEN AND PELVIS FINDINGS  Hepatobiliary: No cystic or solid hepatic lesions. No intra or extrahepatic biliary ductal dilatation. Gallbladder is normal in appearance.  Pancreas: No pancreatic mass. No pancreatic ductal dilatation. No pancreatic or peripancreatic fluid or inflammatory changes.  Spleen: Unremarkable.  Adrenals/Urinary Tract: Bilateral adrenal glands and bilateral kidneys are normal in appearance. There is no hydroureteronephrosis or perinephric stranding to indicate urinary tract obstruction at this time. Urinary bladder is nearly decompressed, but otherwise unremarkable in appearance.  Stomach/Bowel: The appearance of the stomach is normal. There is no pathologic dilatation of small bowel or colon. Normal appendix.  Vascular/Lymphatic: Aortic atherosclerosis, with vascular findings and measurements pertinent to potential TAVR procedure, as detailed below. No aneurysm or dissection identified in the abdominal or pelvic vasculature. Celiac axis, superior mesenteric artery and inferior mesenteric artery are all widely patent without definite hemodynamically significant stenosis. Single renal arteries bilaterally are both widely patent. No lymphadenopathy noted in the abdomen or pelvis.  Reproductive: Prostate gland and seminal vesicles are unremarkable in appearance.  Other: No significant volume of ascites.  No pneumoperitoneum.  Musculoskeletal: There are no aggressive appearing  lytic or blastic lesions noted in the visualized portions of the skeleton.  VASCULAR MEASUREMENTS PERTINENT TO TAVR:  AORTA:  Minimal Aortic Diameter -  8 x 11 mm  Severity of Aortic Calcification -  severe  RIGHT PELVIS:  Right Common Iliac Artery -  Minimal Diameter - 6.5 x 2.6 mm  Tortuosity - mild  Calcification - severe  Right External Iliac Artery -  Minimal Diameter - 7.0 x 6.4 mm  Tortuosity - mild to moderate  Calcification - minimal  Right Common Femoral Artery -  Minimal Diameter - 7.4 x 6.1 mm  Tortuosity - mild  Calcification - mild  LEFT PELVIS:  Left Common Iliac Artery -  Minimal Diameter - 3.3 x 7.0 mm  Tortuosity - mild  Calcification - severe  Left External Iliac Artery -  Minimal Diameter - 6.7 x 7.0 mm  Tortuosity - mild  Calcification - minimal  Left Common Femoral Artery -  Minimal Diameter - 6.7 x 4.7 mm  Tortuosity - mild  Calcification - mild  Review of the MIP images confirms the above findings.  IMPRESSION: 1. Vascular findings and measurements  pertinent to potential TAVR procedure, as detailed above. Secondary to severe calcification and small lumen and diameter in the common iliac arteries bilaterally, this patient does not appear to have suitable pelvic arterial access. 2. Severe thickening and calcification of the aortic valve, compatible with the reported clinical history of severe aortic stenosis. 3. Small pulmonary nodules scattered throughout the lungs bilaterally measuring 5 mm or less in size. Noncontrast chest CT can be considered in 12 months in this high risk patient. This recommendation follows the consensus statement: Guidelines for Management of Incidental Pulmonary Nodules Detected on CT Images: From the Fleischner Society 2017; Radiology 2017; 284:228-243. 4. Aortic atherosclerosis, in addition to left main and 3 vessel coronary artery disease. Status post  median sternotomy for CABG, including LIMA to the LAD. 5. Additional incidental findings, as above.   Electronically Signed   By: Trudie Reed M.D.   On: 10/13/2016 13:41   STS Risk Calculator  Procedure    AVR + redo CABG  Risk of Mortality   9.2% Morbidity or Mortality  39.9% Prolonged LOS   21.0% Short LOS    14.3% Permanent Stroke   3.6% Prolonged Vent Support  27.7% DSW Infection    0.8% Renal Failure    15.2% Reoperation    14.4%   Impression:  Patient has stage D severe symptomatic aortic stenosis. He presents with progressive symptoms of exertional shortness of breath and fatigue consistent with chronic diastolic congestive heart failure, [place] Heart Association functional class II. I have personally reviewed the patient's echocardiogram, diagnostic cardiac catheterization, and CT angiograms. Transthoracic echocardiogram confirms the presence of severe aortic stenosis with severe thickening, calcification, and restricted leaflet mobility involving all 3 leaflets of the patient's aortic valve. Peak velocity across the aortic valve measures greater than 5 m/s corresponding to mean transvalvular gradient estimated 59 mmHg. Left ventricular systolic function remains preserved. Diagnostic cardiac catheterization demonstrates the presence of severe native coronary artery disease but continued patency of all of the previous bypass grafts placed at the time of the patient's surgery 17 years ago. There is some plaque and perhaps 75% stenosis involving the distal end of the vein graft to the right coronary system, but this does not appear critical.  Risks associated with conventional surgical aortic valve replacement with or without redo coronary artery bypass grafting would unquestionably be relatively high. Cardiac gated CT angiogram of the heart demonstrates findings consistent with severe aortic stenosis suitable for treatment using transcatheter aortic valve replacement without  any significant complicating features.  CT and gram of the aorta and iliac vessels reveals severe atherosclerotic disease throughout. The patient has borderline access for possible transfemoral approach, but it appears potentially feasible via the right side. I think it would be reasonable to attempt access via transfemoral approach with low threshold to convert to an alternative access. 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  Other options could include transapical approach, trans-carotid approach, or trans-innominate approach.   Plan:  The patient and his wife were counseled at length regarding treatment alternatives for management of severe symptomatic aortic stenosis. Alternative approaches such as conventional aortic valve replacement, transcatheter aortic valve replacement, and palliative medical therapy were compared and contrasted at length.  The risks associated with conventional surgical aortic valve replacement were been discussed in detail, as were expectations for post-operative convalescence and the reasons why conventional surgery would be associated with relatively high risks.  Alternative approaches for transcatheter aortic valve replacement were discussed as was  expectations for his convalescence following uncomplicated TAVR.   Long-term prognosis with medical therapy was discussed. This discussion was placed in the context of the patient's own specific clinical presentation and past medical history.  All of their questions been addressed.  The patient desires to proceed with TAVR as soon as practical.  Following the decision to proceed with transcatheter aortic valve replacement, a discussion has been held regarding what types of management strategies would be attempted intraoperatively in the event of life-threatening complications, including whether or not the patient would be considered a candidate for the use of cardiopulmonary bypass and/or conversion to  open sternotomy for attempted surgical intervention.  Given the fact that the patient has undergone previous coronary artery bypass grafting he understands that emergency open redo sternotomy would not be attempted. The patient has been advised of a variety of complications that might develop including but not limited to risks of death, stroke, paravalvular leak, aortic dissection or other major vascular complications, aortic annulus rupture, device embolization, cardiac rupture or perforation, mitral regurgitation, acute myocardial infarction, arrhythmia, heart block or bradycardia requiring permanent pacemaker placement, congestive heart failure, respiratory failure, renal failure, pneumonia, infection, other late complications related to structural valve deterioration or migration, or other complications that might ultimately cause a temporary or permanent loss of functional independence or other long term morbidity.  The patient provides full informed consent for the procedure as described and all questions were answered.    I spent in excess of 90 minutes during the conduct of this office consultation and >50% of this time involved direct face-to-face encounter with the patient for counseling and/or coordination of their care.     Salvatore Decent. Cornelius Moras, MD 10/29/2016 11:38 AM

## 2016-10-30 ENCOUNTER — Encounter (HOSPITAL_COMMUNITY): Payer: Self-pay

## 2016-10-30 ENCOUNTER — Ambulatory Visit (HOSPITAL_COMMUNITY)
Admission: RE | Admit: 2016-10-30 | Discharge: 2016-10-30 | Disposition: A | Discharge status: Home / Self Care | Payer: Medicare Other | Source: Ambulatory Visit | Attending: Surgery | Admitting: Surgery

## 2016-10-30 ENCOUNTER — Encounter (HOSPITAL_COMMUNITY)
Admission: RE | Admit: 2016-10-30 | Discharge: 2016-10-30 | Disposition: A | Discharge status: Home / Self Care | Payer: Medicare Other | Source: Ambulatory Visit | Attending: Surgery | Admitting: Surgery

## 2016-10-30 DIAGNOSIS — I350 Nonrheumatic aortic (valve) stenosis: Secondary | ICD-10-CM

## 2016-10-30 DIAGNOSIS — I700 Atherosclerosis of aorta: Secondary | ICD-10-CM | POA: Insufficient documentation

## 2016-10-30 DIAGNOSIS — Z01818 Encounter for other preprocedural examination: Secondary | ICD-10-CM | POA: Insufficient documentation

## 2016-10-30 DIAGNOSIS — Z01810 Encounter for preprocedural cardiovascular examination: Secondary | ICD-10-CM | POA: Diagnosis not present

## 2016-10-30 DIAGNOSIS — J449 Chronic obstructive pulmonary disease, unspecified: Secondary | ICD-10-CM | POA: Insufficient documentation

## 2016-10-30 HISTORY — DX: Hypothyroidism, unspecified: E03.9

## 2016-10-30 LAB — CBC
HEMATOCRIT: 36.1 % — AB (ref 39.0–52.0)
Hemoglobin: 12 g/dL — ABNORMAL LOW (ref 13.0–17.0)
MCH: 30.7 pg (ref 26.0–34.0)
MCHC: 33.2 g/dL (ref 30.0–36.0)
MCV: 92.3 fL (ref 78.0–100.0)
Platelets: 145 10*3/uL — ABNORMAL LOW (ref 150–400)
RBC: 3.91 MIL/uL — ABNORMAL LOW (ref 4.22–5.81)
RDW: 13.7 % (ref 11.5–15.5)
WBC: 6.6 10*3/uL (ref 4.0–10.5)

## 2016-10-30 LAB — URINALYSIS, ROUTINE W REFLEX MICROSCOPIC
BILIRUBIN URINE: NEGATIVE
Glucose, UA: NEGATIVE mg/dL
Hgb urine dipstick: NEGATIVE
KETONES UR: NEGATIVE mg/dL
Leukocytes, UA: NEGATIVE
NITRITE: NEGATIVE
PROTEIN: NEGATIVE mg/dL
SPECIFIC GRAVITY, URINE: 1.008 (ref 1.005–1.030)
pH: 6 (ref 5.0–8.0)

## 2016-10-30 LAB — APTT: APTT: 38 s — AB (ref 24–36)

## 2016-10-30 LAB — COMPREHENSIVE METABOLIC PANEL
ALBUMIN: 4.2 g/dL (ref 3.5–5.0)
ALT: 14 U/L — ABNORMAL LOW (ref 17–63)
ANION GAP: 9 (ref 5–15)
AST: 23 U/L (ref 15–41)
Alkaline Phosphatase: 42 U/L (ref 38–126)
BILIRUBIN TOTAL: 0.5 mg/dL (ref 0.3–1.2)
BUN: 18 mg/dL (ref 6–20)
CO2: 20 mmol/L — ABNORMAL LOW (ref 22–32)
Calcium: 8.9 mg/dL (ref 8.9–10.3)
Chloride: 108 mmol/L (ref 101–111)
Creatinine, Ser: 1.23 mg/dL (ref 0.61–1.24)
GFR calc Af Amer: >60 mL/min (ref >60)
GFR calc non Af Amer: 53 mL/min — ABNORMAL LOW (ref >60)
GLUCOSE: 109 mg/dL — AB (ref 65–99)
POTASSIUM: 4.2 mmol/L (ref 3.5–5.1)
SODIUM: 137 mmol/L (ref 135–145)
TOTAL PROTEIN: 7.4 g/dL (ref 6.5–8.1)

## 2016-10-30 LAB — GLUCOSE, CAPILLARY: Glucose-Capillary: 105 mg/dL — ABNORMAL HIGH (ref 65–99)

## 2016-10-30 LAB — SURGICAL PCR SCREEN
MRSA, PCR: NEGATIVE
Staphylococcus aureus: NEGATIVE

## 2016-10-30 LAB — BLOOD GAS, ARTERIAL
ACID-BASE DEFICIT: 1.5 mmol/L (ref 0.0–2.0)
BICARBONATE: 22.6 mmol/L (ref 20.0–28.0)
Drawn by: 421801
FIO2: 0.21
O2 SAT: 98.6 %
PATIENT TEMPERATURE: 98.6
pCO2 arterial: 36.7 mmHg (ref 32.0–48.0)
pH, Arterial: 7.405 (ref 7.350–7.450)
pO2, Arterial: 114 mmHg — ABNORMAL HIGH (ref 83.0–108.0)

## 2016-10-30 LAB — ABO/RH: ABO/RH(D): A POS

## 2016-10-30 LAB — PROTIME-INR
INR: 1.06
Prothrombin Time: 13.8 seconds (ref 11.4–15.2)

## 2016-10-30 NOTE — Progress Notes (Signed)
PCP: [address]je[address] Cardiologist: Dr. Allyson SabalBerry EKG: 10/30/16 CXR 10/30/16 ECHO 10/02/16 Stress test: 03/21/14 Cath: 10/05/16  Pt with no complaints of chest pain, SOB or signs of infection at PAT appt

## 2016-10-31 LAB — HEMOGLOBIN A1C
Hgb A1c MFr Bld: 5.5 % (ref 4.8–5.6)
MEAN PLASMA GLUCOSE: 111 mg/dL

## 2016-11-02 MED ORDER — DOPAMINE-DEXTROSE 3.2-5 MG/ML-% IV SOLN
0.0000 ug/kg/min | INTRAVENOUS | Status: DC
  Filled 2016-11-02: qty 250

## 2016-11-02 MED ORDER — SODIUM CHLORIDE 0.9 % IV SOLN
INTRAVENOUS | Status: AC
  Administered 2016-11-03: 1.5 [IU]/h via INTRAVENOUS
  Filled 2016-11-02: qty 2.5

## 2016-11-02 MED ORDER — MAGNESIUM SULFATE 50 % IJ SOLN
40.0000 meq | INTRAMUSCULAR | Status: DC
  Filled 2016-11-02: qty 10

## 2016-11-02 MED ORDER — PHENYLEPHRINE HCL 10 MG/ML IJ SOLN
30.0000 ug/min | INTRAVENOUS | Status: AC
  Administered 2016-11-03: 20 ug/min via INTRAVENOUS
  Filled 2016-11-02: qty 2

## 2016-11-02 MED ORDER — NOREPINEPHRINE BITARTRATE 1 MG/ML IV SOLN
0.0000 ug/min | INTRAVENOUS | Status: AC
  Administered 2016-11-03: 1 ug/min via INTRAVENOUS
  Filled 2016-11-02: qty 4

## 2016-11-02 MED ORDER — NITROGLYCERIN IN D5W 200-5 MCG/ML-% IV SOLN
2.0000 ug/min | INTRAVENOUS | Status: AC
  Administered 2016-11-03: 5 ug/min via INTRAVENOUS
  Filled 2016-11-02: qty 250

## 2016-11-02 MED ORDER — POTASSIUM CHLORIDE 2 MEQ/ML IV SOLN
80.0000 meq | INTRAVENOUS | Status: DC
  Filled 2016-11-02: qty 40

## 2016-11-02 MED ORDER — DEXMEDETOMIDINE HCL IN NACL 400 MCG/100ML IV SOLN
0.1000 ug/kg/h | INTRAVENOUS | Status: AC
  Administered 2016-11-03: .5 ug/kg/h via INTRAVENOUS
  Filled 2016-11-02: qty 100

## 2016-11-02 MED ORDER — EPINEPHRINE PF 1 MG/ML IJ SOLN
0.0000 ug/min | INTRAVENOUS | Status: DC
  Filled 2016-11-02: qty 4

## 2016-11-02 MED ORDER — SODIUM CHLORIDE 0.9 % IV SOLN
INTRAVENOUS | Status: DC
  Filled 2016-11-02: qty 30

## 2016-11-02 MED ORDER — DEXTROSE 5 % IV SOLN
1.5000 g | INTRAVENOUS | Status: DC
  Filled 2016-11-02 (×2): qty 1.5

## 2016-11-02 MED ORDER — VANCOMYCIN HCL 10 G IV SOLR
1250.0000 mg | INTRAVENOUS | Status: AC
  Administered 2016-11-03: 1250 mg via INTRAVENOUS
  Filled 2016-11-02: qty 1250

## 2016-11-02 NOTE — Anesthesia Preprocedure Evaluation (Addendum)
Anesthesia Evaluation  Patient identified by MRN, date of birth, ID band Patient awake    Reviewed: Allergy & Precautions, NPO status , Patient's Chart, lab work & pertinent test results, reviewed documented beta blocker date and time   History of Anesthesia Complications Negative for: history of anesthetic complications  Airway Mallampati: III  TM Distance: >3 FB Neck ROM: Full    Dental  (+) Teeth Intact, Dental Advisory Given   Pulmonary former smoker (quit 1974),    breath sounds clear to auscultation       Cardiovascular hypertension, Pt. on medications and Pt. on home beta blockers + CAD, + CABG (10/17 cath: grafts patent) and + Peripheral Vascular Disease  + Valvular Problems/Murmurs AS  Rhythm:Regular Rate:Normal + Systolic murmurs EF 60-65%, severe aortic stenosis. There was trivial regurgitation. Peak velocity (S): 516 cm/s. Mean gradient (S): 59 mm Hg. Valve area (Vmean): 0.53 cm^2.   Neuro/Psych negative neurological ROS     GI/Hepatic negative GI ROS, Neg liver ROS,   Endo/Other  diabetes (glu 107), Type 2, Oral Hypoglycemic AgentsHypothyroidism   Renal/GU      Musculoskeletal  (+) Arthritis ,   Abdominal   Peds  Hematology plavix   Anesthesia Other Findings   Reproductive/Obstetrics                           Anesthesia Physical Anesthesia Plan  ASA: III  Anesthesia Plan: General   Post-op Pain Management:    Induction: Intravenous  Airway Management Planned: Oral ETT  Additional Equipment: Arterial line, CVP, TEE and Ultrasound Guidance Line Placement  Intra-op Plan:   Post-operative Plan: Extubation in OR  Informed Consent: I have reviewed the patients History and Physical, chart, labs and discussed the procedure including the risks, benefits and alternatives for the proposed anesthesia with the patient or authorized representative who has indicated his/her  understanding and acceptance.   Dental advisory given  Plan Discussed with: CRNA and Surgeon  Anesthesia Plan Comments: (Plan routine monitors, A line, central access, GETA with TEE by cardiology.  PA cath available)        Anesthesia Quick Evaluation

## 2016-11-02 NOTE — H&P (Signed)
[address]       [place] [address]             [phone]      Cardiothoracic Surgery Admission History and Physical   Referring Provider is Runell Gess, MD PCP is Maryjean Ka, MD      Chief Complaint  Patient presents with  .  Severe Aortic Stenosis        HPI:  The patient is an 82 year old gentleman with hypertension, hyperlipidemia and diabetes as well as coronary artery disease s/p CABG by me in 2000. He has done well over the past 17 years but has developed progressive exertional shortness of breath over the past 6 months. He says that his normal activities around the house do not cause any symptoms but if he walks fast or pulls his trash cans out he gets short of breath. He has a known history of aortic stenosis and his 2D echo in 02/2015 showed severe AS with a mean gradient of 44 mm Hg and a trileaflet severely calcified valve. His most recent echo on 10/02/2016 shows further progression with an increase in the mean gradient to 59 mm Hg with trivial AI and normal LV function. He underwent cardiac cath on 10/05/2016 showing severe multi-vessel coronary disease with patent grafts.  He lives at home with his wife and remains independent. He gets out to the store to shop and tries to remain active. He says that as long as he goes slow he if fine. He has not had any chest pain or dizziness. He denies orthopnea.      Past Medical History:  Diagnosis Date  . Aortic stenosis   . Arthritis   . CAD (coronary artery disease)    a. CAD s/p CABG 2000   b. cath on 10/05/16 with severe native vessel CAD and patent bypass grafts  . Carotid stenosis    bilateral internal, left greater than right, stable  . Diabetes mellitus type 2, noninsulin dependent (HCC)   . Hyperlipidemia   . Hypertension          Past Surgical History:  Procedure Laterality Date  . CARDIAC CATHETERIZATION  11/10/1999   3-vessel disease,  recommendation for CABG  . CARDIAC CATHETERIZATION N/A 10/05/2016   Procedure: Right/Left Heart Cath and Coronary/Graft Angiography;  Surgeon: Runell Gess, MD;  Location: [facility] INVASIVE CV LAB;  Service: Cardiovascular;  Laterality: N/A;  . CORONARY ARTERY BYPASS GRAFT  11/13/1999   LIMA to LAD and a vein graft to the first OM branch, second OM branch, and to the RCA (Dr. Wayland Salinas)  . NM MYOCAR PERF WALL MOTION  09/2013   normal lexiscan myoview  . PERIPHERAL VASCULAR CATHETERIZATION N/A 10/05/2016   Procedure: Abdominal Aortogram;  Surgeon: Runell Gess, MD;  Location: [facility] INVASIVE CV LAB;  Service: Cardiovascular;  Laterality: N/A;  . TRANSTHORACIC ECHOCARDIOGRAM  12/2013   EF 55-60%, grade 1 diastolic dysfunction, high ventricular filling pressure; severe AV stenosis; calcified MV annulus; LA & RA mildly dilated; atrial septal aneurysm         Family History  Problem Relation Age of Onset  . Lung disease Mother   . Colon cancer Father   . Diabetes Sister   . CAD Sister     Social History        Social History  . Marital status: Married    Spouse name: N/A  . Number of children: 2  .  Years of education: GED      Occupational History  . Not on file.        Social History Main Topics  . Smoking status: Former Smoker    Packs/day: 1.00    Years: 5.00    Types: Cigarettes    Quit date: 07/20/1973  . Smokeless tobacco: Never Used  . Alcohol use No  . Drug use: No  . Sexual activity: Not on file       Other Topics Concern  . Not on file      Social History Narrative  . No narrative on file          Current Outpatient Prescriptions  Medication Sig Dispense Refill  . amLODipine (NORVASC) 10 MG tablet Take 10 mg by mouth daily with breakfast.   6  . benazepril (LOTENSIN) 20 MG tablet Take 20 mg by mouth daily with breakfast.   6  . carvedilol (COREG) 6.25 MG tablet Take 6.25 mg by mouth 2 (two) times daily with a meal.      . clopidogrel (PLAVIX) 75 MG tablet Take 75 mg by mouth daily.     Marland Kitchen levothyroxine (SYNTHROID, LEVOTHROID) 88 MCG tablet Take 88 mcg by mouth every evening.   0  . Lutein 6 MG CAPS Take 6 mg by mouth daily with breakfast.     . metFORMIN (GLUCOPHAGE) 500 MG tablet Take 500 mg by mouth daily with breakfast.    . simvastatin (ZOCOR) 20 MG tablet Take 20 mg by mouth every evening.      No current facility-administered medications for this visit.     No Known Allergies    Review of Systems:              General:                      normal appetite, decreased energy, no weight gain, no weight loss, no fever             Cardiac:                       no chest pain with exertion, no chest pain at rest, mild SOB with moderate exertion, no resting SOB, no PND, no orthopnea, no palpitations, no arrhythmia, no atrial fibrillation, no LE edema, no dizzy spells, no syncope             Respiratory:                 exertional shortness of breath, no home oxygen, no productive cough, no dry cough, no bronchitis, no wheezing, no hemoptysis, no asthma, no pain with inspiration or cough, no sleep apnea, no CPAP at night             GI:                               no difficulty swallowing, no reflux, no frequent heartburn, no hiatal hernia, no abdominal pain, no constipation, no diarrhea, no hematochezia, no hematemesis, no melena             GU:                              no dysuria,  no frequency, no urinary tract infection, no hematuria, no enlarged prostate, no kidney stones, no kidney disease  Vascular:                     no pain suggestive of claudication, no pain in feet, has leg cramps, no varicose veins, no DVT, no non-healing foot ulcer             Neuro:                         no stroke, no TIA's, no seizures, no headaches, no temporary blindness one eye,  no slurred speech, no peripheral neuropathy, no chronic pain, no instability of gait, no memory/cognitive  dysfunction             Musculoskeletal:         no arthritis, no joint swelling, no myalgias, no difficulty walking, normal mobility              Skin:                            no rash, no itching, no skin infections, no pressure sores or ulcerations             Psych:                         no anxiety, no depression, no nervousness, no unusual recent stress             Eyes:                           no blurry vision, no floaters, no recent vision changes,  wears glasses or contacts             ENT:                            has hearing loss, no loose or painful teeth, no dentures, last saw dentist 08/26/2016             Hematologic:               has easy bruising, no abnormal bleeding, no clotting disorder, no frequent epistaxis             Endocrine:                   has diabetes, does not check CBG's at home                                                       Physical Exam:              BP 134/64 (BP Location: Right Arm, Patient Position: Sitting, Cuff Size: Normal)   Pulse (!) 59   Resp 20   Ht  (1.803 m)   Wt 174 lb (78.9 kg)   SpO2 98% Comment: RA  BMI 24.27 kg/m              General:                      Elderly but  well-appearing             HEENT:  Unremarkable , NCAT, PERLA, EOMI, oropharynx clear             Neck:                           no JVD, no bruits, no adenopathy or thyromegaly             Chest:                          clear to auscultation, symmetrical breath sounds, no wheezes, no rhonchi              CV:                              RRR, grade III/VI crescendo/decrescendo murmur heard best at RSB,  no diastolic murmur             Abdomen:                    soft, non-tender, no masses or organomegaly             Extremities:                 warm, well-perfused, pulses palpable in feet, no LE edema             Rectal/GU                   Deferred             Neuro:                         Grossly non-focal and symmetrical  throughout             Skin:                            Clean and dry, no rashes, no breakdown   Diagnostic Tests:  [facility] [address] [place], [place] [address] [phone]  ------------------------------------------------------------------- Transthoracic Echocardiography  Patient: Eliel, Dudding MR #: [id number] Study Date: 10/02/2016 Gender: M Age: 39 Height: 180.3 cm Weight: 78.7 kg BSA: 1.99 m^2 Pt. Status: Room:  ORDERING Nanetta Batty, MD REFERRING Nanetta Batty, MD SONOGRAPHER [place], Will ATTENDING Donato Schultz, M.D. PERFORMING [facility], Outpatient  cc:  ------------------------------------------------------------------- LV EF: 60% - 65%  ------------------------------------------------------------------- Indications: (I35.0).  ------------------------------------------------------------------- History: PMH: Acquired from the patient and from the patient&'s chart. Coronary artery disease. Severe aortic stenosis. Risk factors: Former tobacco use. Hypertension. Dyslipidemia.  ------------------------------------------------------------------- Study Conclusions  - Left ventricle: The cavity size was normal. There was mild concentric hypertrophy. Systolic function was normal. The estimated ejection fraction was in the range of 60% to 65%. Wall motion was normal; there were no regional wall motion abnormalities. Doppler parameters are consistent with abnormal left ventricular relaxation (grade 1 diastolic dysfunction). - Aortic valve: Valve mobility was restricted. There was severe stenosis. There was trivial regurgitation. Peak velocity (S): 516 cm/s. Mean gradient (S): 59 mm Hg. Valve area (Vmean): 0.53 cm^2. - Mitral valve: Calcified annulus. - Left atrium: The atrium  was mildly dilated. Anterior-posterior dimension: 44 mm. - Pulmonary arteries: Systolic pressure was mildly to moderately increased. PA peak pressure: 41 mm Hg (S).  Impressions:  - Severe aortic stenosis (Prior mean gradient ).  ------------------------------------------------------------------- Labs,  prior tests, procedures, and surgery: Coronary artery bypass grafting.  ------------------------------------------------------------------- Study data: Comparison was made to the study of 02/28/2016. Study status: Routine. Procedure: The patient reported no pain pre or post test. Transthoracic echocardiography for left ventricular function evaluation and for assessment of valvular function. Image quality was adequate. Study completion: There were no complications. Transthoracic echocardiography. M-mode, complete 2D, spectral Doppler, and color Doppler. Birthdate: Patient birthdate: [birth year]. Age: Patient is 82 yr old. Sex: Gender: male. BMI: 24.2 kg/m^2. Blood pressure: 153/67 Patient status: Outpatient. Study date: Study date: 10/02/2016. Study time: 07:45 AM. Location: [facility]  -------------------------------------------------------------------  ------------------------------------------------------------------- Left ventricle: The cavity size was normal. There was mild concentric hypertrophy. Systolic function was normal. The estimated ejection fraction was in the range of 60% to 65%. Wall motion was normal; there were no regional wall motion abnormalities. Doppler parameters are consistent with abnormal left ventricular relaxation (grade 1 diastolic dysfunction).  ------------------------------------------------------------------- Aortic valve: Trileaflet; severely thickened, severely calcified leaflets. Valve mobility was restricted. Doppler: There was severe stenosis. There was trivial regurgitation.  VTI ratio of LVOT to aortic valve: 0.16. Valve area (VTI): 0.5 cm^2. Indexed valve area (VTI): 0.25 cm^2/m^2. Peak velocity ratio of LVOT to aortic valve: 0.17. Valve area (Vmax): 0.52 cm^2. Indexed valve area (Vmax): 0.26 cm^2/m^2. Mean velocity ratio of LVOT to aortic valve: 0.17. Valve area (Vmean): 0.53 cm^2. Indexed valve area (Vmean): 0.26 cm^2/m^2. Mean gradient (S): 59 mm Hg. Peak gradient (S): 107 mm Hg.  ------------------------------------------------------------------- Aorta: Aortic root: The aortic root was normal in size.  ------------------------------------------------------------------- Mitral valve: Calcified annulus. Mobility was not restricted. Doppler: Transvalvular velocity was within the normal range. There was no evidence for stenosis. There was no regurgitation. Peak gradient (D): 4 mm Hg.  ------------------------------------------------------------------- Left atrium: The atrium was mildly dilated.  ------------------------------------------------------------------- Right ventricle: The cavity size was normal. Wall thickness was normal. Systolic function was normal.  ------------------------------------------------------------------- Pulmonic valve: Poorly visualized. Structurally normal valve. Cusp separation was normal. Doppler: Transvalvular velocity was within the normal range. There was no evidence for stenosis. There was trivial regurgitation.  ------------------------------------------------------------------- Tricuspid valve: Structurally normal valve. Doppler: Transvalvular velocity was within the normal range. There was mild regurgitation.  ------------------------------------------------------------------- Pulmonary artery: The main pulmonary artery was normal-sized. Systolic pressure was mildly to moderately increased.  ------------------------------------------------------------------- Right atrium:  The atrium was normal in size.  ------------------------------------------------------------------- Pericardium: There was no pericardial effusion.  ------------------------------------------------------------------- Systemic veins: Inferior vena cava: The vessel was normal in size.  ------------------------------------------------------------------- Measurements  Left ventricle Value Reference LV ID, ED, PLAX chordal (L) 41.5 mm 43 - 52 LV ID, ES, PLAX chordal 28.1 mm 23 - 38 LV fx shortening, PLAX chordal 32 % >=29 LV PW thickness, ED 13.2 mm --------- IVS/LV PW ratio, ED 1.04 <=1.3 Stroke volume, 2D 74 ml --------- Stroke volume/bsa, 2D 37 ml/m^2 --------- LV ejection fraction, 1-p A4C 67 % --------- LV e&', lateral 8.87 cm/s --------- LV E/e&', lateral 11.61 --------- LV e&', medial 5.95 cm/s --------- LV E/e&', medial 17.31 --------- LV e&', average 7.41 cm/s --------- LV E/e&', average 13.9 ---------  Ventricular septum Value Reference IVS thickness, ED 13.7 mm ---------  LVOT Value Reference LVOT ID, S 20 mm --------- LVOT area 3.14 cm^2 --------- LVOT ID 20 mm --------- LVOT peak velocity, S 85.7 cm/s --------- LVOT mean velocity, S 61.1  cm/s --------- LVOT VTI, S 23.6 cm --------- LVOT peak gradient, S 3 mm Hg --------- Stroke volume (SV), LVOT DP 74.1 ml --------- Stroke index (SV/bsa), LVOT  DP 37.3 ml/m^2 ---------  Aortic valve Value Reference Aortic valve peak velocity, S 516 cm/s --------- Aortic valve mean velocity, S 365 cm/s --------- Aortic valve VTI, S 148 cm --------- Aortic mean gradient, S 59 mm Hg --------- Aortic peak gradient, S 107 mm Hg --------- VTI ratio, LVOT/AV 0.16 --------- Aortic valve area, VTI 0.5 cm^2 --------- Aortic valve area/bsa, VTI 0.25 cm^2/m^2 --------- Velocity ratio, peak, LVOT/AV 0.17 --------- Aortic valve area, peak velocity 0.52 cm^2 --------- Aortic valve area/bsa, peak 0.26 cm^2/m^2 --------- velocity Velocity ratio, mean, LVOT/AV 0.17 --------- Aortic valve area, mean velocity 0.53 cm^2 --------- Aortic valve area/bsa, mean 0.26 cm^2/m^2 --------- velocity Aortic regurg pressure half-time 375 ms ---------  Aorta Value Reference Aortic root ID, ED 33 mm --------- Ascending aorta ID, A-P, S 34 mm ---------  Left atrium Value Reference LA ID, A-P, ES 44 mm --------- LA ID/bsa, A-P (H) 2.21 cm/m^2 <=2.2 LA volume, S 65 ml --------- LA volume/bsa, S  32.7 ml/m^2 --------- LA volume, ES, 1-p A4C 58 ml --------- LA volume/bsa, ES, 1-p A4C 29.2 ml/m^2 --------- LA volume, ES, 1-p A2C 69 ml --------- LA volume/bsa, ES, 1-p A2C 34.7 ml/m^2 ---------  Mitral valve Value Reference Mitral E-wave peak velocity 103 cm/s --------- Mitral A-wave peak velocity 97.7 cm/s --------- Mitral deceleration time 225 ms 150 - 230 Mitral peak gradient, D 4 mm Hg --------- Mitral E/A ratio, peak 1.1 ---------  Pulmonary arteries Value Reference PA pressure, S, DP (H) 41 mm Hg <=30  Tricuspid valve Value Reference Tricuspid regurg peak velocity 309 cm/s --------- Tricuspid peak RV-RA gradient 38 mm Hg ---------  Systemic veins Value Reference Estimated CVP 3 mm Hg ---------  Right ventricle Value Reference RV pressure, S, DP (H) 41 mm Hg <=30 RV s&', lateral, S 9.46 cm/s ---------  Legend: (L) and (H) mark values outside specified reference range.  ------------------------------------------------------------------- Prepared and Electronically Authenticated by  Donato Schultz, M.D. 2017-10-13T11:02:42  Physicians   Panel Physicians Referring Physician Case Authorizing Physician  Runell Gess, MD (Primary)    Procedures   Abdominal Aortogram  Right/Left Heart Cath and Coronary/Graft Angiography  Conclusion     Ost RCA to Mid RCA lesion, 100  %stenosed.  Ost Cx to Prox Cx lesion, 100 %stenosed.  Prox LAD to Mid LAD lesion, 90 %stenosed.  Insertion lesion, 75 %stenosed.  Dashun Borre Moffittis a 83 y.o.male   [id number] LOCATION: FACILITY: [facility]  PHYSICIAN: Nanetta Batty, M.D. Aug 29, 1932   DATE OF PROCEDURE: 10/05/2016  DATE OF DISCHARGE:     CARDIAC CATHETERIZATION / PV Anio    History obtained from chart review.The patient is a very pleasant 81 year old, mildly overweight, married Caucasian male, father of 2, grandfather to 1 grandchild who I last saw 04/07/16. He is accompanied by his wife today. He has a history of CAD status post coronary artery bypass grafting in 2000, with a LIMA to his LAD, a vein graft to the first obtuse marginal branch, second obtuse marginal branch and to the RCA. His other problems include hypertension, hyperlipidemia and noninsulin-requiring diabetes. He does have bilateral internal carotid artery stenosis left greater than right which has remained stable over the last 6 months. He is neurologically asymptomatic. He also has mild aortic stenosis with valve area of 1.4 cm squared. His last Myoview performed October 06, 2011, was low risk. He is asymptomatic. Dr. Sheria Lang follows his lipid profile. He has had progressive aortic stenosis with his most recent echo performed 02/28/16 were revealing preserved LV function . He  also has moderate bilateral ICA stenosis by duplex ultrasound. Since I saw him in April of this year he's had progressive dyspnea on exertion. I suspect that his aortic stenosis has progressed. We will plan on performing right and left heart cardiac catheterization to define his anatomy and physiology in anticipation of potential TAVR.     IMPRESSION:Mr. Collums's grafts are patent. He does have a moderate anastomotic lesion of his RCA vein graft. I was unable to cross the valve because of the severity of stenosis. He has normal LV function by 2-D echo. I did do  an abdominal aortogram documented the caliber of his distal aorta and iliac arteries and potential suitability for TAVR. The sheath will be removed and pressure held on the groin to achieve hemostasis. The patient left the lab in stable condition. I've discussed the case with Dr. Excell Seltzerooper who will see the patient prior to discharge tomorrow morning and evaluate suitability for TA VR.  Nanetta BattyBerry, Jonathan. MD, [facility] Center[facility]ACC 10/05/2016 4:05 PM      Indications   Critical aortic valve stenosis [I35.0 (ICD-10-CM)]  Coronary artery disease due to lipid rich plaque [I25.10, I25.83 (ICD-10-CM)]  Procedural Details/Technique   Technical Details PROCEDURE DESCRIPTION:   The patient was brought to the second floor [facility] cath lab in the postabsorptive state. He was not premedicated . His right groin was prepped and shaved in usual sterile fashion. Xylocaine 1% was used for local anesthesia. A 5 French sheath was inserted into the right common femoral artery using standard Seldinger technique. A 7 French sheath was inserted into the right common femoral vein. 5 French right and left Judkins diagnostic catheters along the 5 French pigtail catheter were used for selective coronary angiography, selective vein graft and IMA graft angiography and distal abdominal aortography. Isovue dye was used for the entirety of the case. Retrograde aortic, left ventricular and pullback pressures were recorded.   Estimated blood loss <50 mL.  During this procedure no sedation was administered.    Coronary Findings   Dominance: Right  Left Anterior Descending  Prox LAD to Mid LAD lesion, 90% stenosed.  Left Circumflex  Ost Cx to Prox Cx lesion, 100% stenosed.  Right Coronary Artery  Ost RCA to Mid RCA lesion, 100% stenosed.  Graft Angiography  Graft to 2nd Mrg  LIMA Graft to Dist LAD  Graft to 2nd Diag  Graft to Ost RPDA  Insertion lesion, 75% stenosed.  Right Heart   Right Heart Pressures Right  atrial pressure-4/8, mean equals 2 Right ventricular pressure-33/0 Pulmonary artery pressure-30/11, mean equals 20 Pulmonary capillary wedge pressure-a with equals 12, V-wave equals 11, mean equals 9 Cardiac output by Fick-5.63 L/m Cardiac output by thermal-6.7 L/m    Coronary Diagrams   Diagnostic Diagram     Implants        No implant documentation for this case.  PACS Images   Show images for Cardiac catheterization   Link to Procedure Log   Procedure Log    Hemo Data   Flowsheet Row Most Recent Value  Fick Cardiac Output 5.63 L/min  Fick Cardiac Output Index 2.83 (L/min)/BSA  Thermal Cardiac Output 6.71 L/min  Thermal Cardiac Output Index 3.37 (L/min)/BSA  RA A Wave 4 mmHg  RA V Wave 8 mmHg  RA Mean 2 mmHg  RV Systolic Pressure 33 mmHg  RV Diastolic Pressure 0 mmHg  RV EDP 4 mmHg  PA Systolic Pressure 30 mmHg  PA Diastolic Pressure 11 mmHg  PA Mean 20 mmHg  PW A Wave 12 mmHg  PW V Wave 11 mmHg  PW Mean 9 mmHg  AO Systolic Pressure 156 mmHg  AO Diastolic Pressure 66 mmHg  AO Mean 102 mmHg  TPVR Index 5.94 HRUI  TSVR Index 30.25 HRUI  PVR SVR Ratio 0.11  TPVR/TSVR Ratio 0.2   ADDENDUM REPORT: 10/13/2016 19:44  CLINICAL DATA: 82 year old male with severe aortic stenosis.  EXAM: Cardiac TAVR CT  TECHNIQUE: The patient was scanned on a Philips 256 scanner. A 120 kV retrospective scan was triggered in the descending thoracic aorta at 111 HU's. Gantry rotation speed was 270 msecs and collimation was .9 mm. No beta blockade or nitro were given. The 3D data set was reconstructed in 5% intervals of the R-R cycle. Systolic and diastolic phases were analyzed on a dedicated work station using MPR, MIP and VRT modes. The patient received 80 cc of contrast.  FINDINGS: Aortic Valve: Trileaflet, severely thickened and calcified aortic valve with mild calcifications extending into the LVOT predominantly under the non-coronary cusp.  Aorta:  Normal size, no dissection, moderate diffuse calcifications.  Sinotubular Junction: 27 x 27 mm  Ascending Thoracic Aorta: 32 X 30 mm  Aortic Arch: 26 x 24 mm  Descending Thoracic Aorta: 23 x 22 mm  Sinus of Valsalva Measurements:  Non-coronary: 35 mm  Right -coronary: 30 mm  Left -coronary: 33 mm  Coronary Artery Height above Annulus:  Left Main: 12 mm  Right Coronary: 17 mm  Virtual Basal Annulus Measurements:  Maximum/Minimum Diameter: 28 x 22 mm  Perimeter: 77 mm  Area: 445 mm2  Optimum Fluoroscopic Angle for Delivery: RAO 1 CRA 1  Normal pulmonary vein drainage into the left atrium.  No thrombus in the left atrial appendage.  IMPRESSION: 1. Trileaflet, severely thickened and calcified aortic valve with mild calcifications extending into the LVOT predominantly under the non-coronary cusp. Annular measurements suitable for delivery of a 26 mm Edwards-SAPIEN 3 TAVR valve.  2. Sufficient annulus to coronary distance.  3. Optimum Fluoroscopic Angle for Delivery: RAO 1 CRA 1  Tobias Alexander   Electronically Signed By: Tobias Alexander On: 10/13/2016 19:44   Addended by Lars Masson, MD on 10/13/2016 7:46 PM    Study Result   EXAM: OVER-READ INTERPRETATION CT CHEST  The following report is an over-read performed by radiologist Dr. Royal Piedra [facility] on 10/13/2016. This over-read does not include interpretation of cardiac or coronary anatomy or pathology. The coronary calcium score/coronary CTA interpretation by the cardiologist is attached.  COMPARISON: None.  FINDINGS: Full description of extracardiac findings is under separate dictation for contemporaneously obtained CTA of the chest, abdomen and pelvis 10/13/2016.  IMPRESSION: Please see separate dictation for contemporaneously obtained CTA of the chest, abdomen and pelvis 10/13/2016 for full description  of extracardiac findings.  Electronically Signed: By: Trudie Reed M.D. On: 10/13/2016 12:16     CLINICAL DATA: 82 year old male with history of severe aortic stenosis. Shortness of breath on exertion for some time. Preprocedural study prior to potential transcatheter aortic valve replacement (TAVR) procedure.  EXAM: CT ANGIOGRAPHY CHEST, ABDOMEN AND PELVIS  TECHNIQUE: Multidetector CT imaging through the chest, abdomen and pelvis was performed using the standard protocol during bolus administration of intravenous contrast. Multiplanar reconstructed images and MIPs were obtained and reviewed to evaluate the vascular anatomy.  CONTRAST: 70 mL of Isovue 370.  COMPARISON: None.  FINDINGS: CTA CHEST FINDINGS  Cardiovascular: Heart size is mildly enlarged. There is no significant pericardial fluid, thickening or pericardial  calcification. There is aortic atherosclerosis, as well as atherosclerosis of the great vessels of the mediastinum and the coronary arteries, including calcified atherosclerotic plaque in the left main, left anterior descending, left circumflex and right coronary arteries. Status post median sternotomy for CABG, including LIMA to the LAD. Severe thickening calcification of the aortic valve.  Mediastinum/Lymph Nodes: No pathologically enlarged mediastinal or hilar lymph nodes. Esophagus is unremarkable in appearance. No axillary lymphadenopathy.  Lungs/Pleura: A few scattered tiny pulmonary nodules are noted in the lungs bilaterally, largest of which measures 5 mm in the left lower lobe (image 54 of series 407). No other larger more suspicious appearing pulmonary nodules or masses are noted. No acute consolidative airspace disease. No pleural effusions. Mild diffuse bronchial wall thickening with very mild centrilobular and paraseptal emphysema.  Musculoskeletal/Soft Tissues: There are no aggressive appearing lytic or blastic lesions  noted in the visualized portions of the skeleton. Median sternotomy wires.  CTA ABDOMEN AND PELVIS FINDINGS  Hepatobiliary: No cystic or solid hepatic lesions. No intra or extrahepatic biliary ductal dilatation. Gallbladder is normal in appearance.  Pancreas: No pancreatic mass. No pancreatic ductal dilatation. No pancreatic or peripancreatic fluid or inflammatory changes.  Spleen: Unremarkable.  Adrenals/Urinary Tract: Bilateral adrenal glands and bilateral kidneys are normal in appearance. There is no hydroureteronephrosis or perinephric stranding to indicate urinary tract obstruction at this time. Urinary bladder is nearly decompressed, but otherwise unremarkable in appearance.  Stomach/Bowel: The appearance of the stomach is normal. There is no pathologic dilatation of small bowel or colon. Normal appendix.  Vascular/Lymphatic: Aortic atherosclerosis, with vascular findings and measurements pertinent to potential TAVR procedure, as detailed below. No aneurysm or dissection identified in the abdominal or pelvic vasculature. Celiac axis, superior mesenteric artery and inferior mesenteric artery are all widely patent without definite hemodynamically significant stenosis. Single renal arteries bilaterally are both widely patent. No lymphadenopathy noted in the abdomen or pelvis.  Reproductive: Prostate gland and seminal vesicles are unremarkable in appearance.  Other: No significant volume of ascites. No pneumoperitoneum.  Musculoskeletal: There are no aggressive appearing lytic or blastic lesions noted in the visualized portions of the skeleton.  VASCULAR MEASUREMENTS PERTINENT TO TAVR:  AORTA:  Minimal Aortic Diameter - 8 x 11 mm  Severity of Aortic Calcification - severe  RIGHT PELVIS:  Right Common Iliac Artery -  Minimal Diameter - 6.5 x 2.6 mm  Tortuosity - mild  Calcification - severe  Right External Iliac Artery -  Minimal  Diameter - 7.0 x 6.4 mm  Tortuosity - mild to moderate  Calcification - minimal  Right Common Femoral Artery -  Minimal Diameter - 7.4 x 6.1 mm  Tortuosity - mild  Calcification - mild  LEFT PELVIS:  Left Common Iliac Artery -  Minimal Diameter - 3.3 x 7.0 mm  Tortuosity - mild  Calcification - severe  Left External Iliac Artery -  Minimal Diameter - 6.7 x 7.0 mm  Tortuosity - mild  Calcification - minimal  Left Common Femoral Artery -  Minimal Diameter - 6.7 x 4.7 mm  Tortuosity - mild  Calcification - mild  Review of the MIP images confirms the above findings.  IMPRESSION: 1. Vascular findings and measurements pertinent to potential TAVR procedure, as detailed above. Secondary to severe calcification and small lumen and diameter in the common iliac arteries bilaterally, this patient does not appear to have suitable pelvic arterial access. 2. Severe thickening and calcification of the aortic valve, compatible with the reported clinical history of severe aortic  stenosis. 3. Small pulmonary nodules scattered throughout the lungs bilaterally measuring 5 mm or less in size. Noncontrast chest CT can be considered in 12 months in this high risk patient. This recommendation follows the consensus statement: Guidelines for Management of Incidental Pulmonary Nodules Detected on CT Images: From the Fleischner Society 2017; Radiology 2017; 284:228-243. 4. Aortic atherosclerosis, in addition to left main and 3 vessel coronary artery disease. Status post median sternotomy for CABG, including LIMA to the LAD. 5. Additional incidental findings, as above.   Electronically Signed By: Trudie Reed M.D. On: 10/13/2016 13:41  RISK SCORES About the STS Risk Calculator Procedure: AV Replacement  Risk of Mortality: 7.483%  Morbidity or Mortality: 33.28%  Long Length of Stay: 16.885%  Short Length of Stay: 13.761%  Permanent Stroke:  2.66%  Prolonged Ventilation: 26.878%  DSW Infection: 0.37%  Renal Failure: 14.477%  Reoperation: 10.228%   Impression:  This 82 year old gentleman has stage D severe symptomatic aortic stenosis with development of progressive exertional shortness of breath over the past 6 months. I have personally reviewed and interpreted his recent echo and cath films as well as his CT studies. He has a severely calcified trileaflet aortic valve with poor leaflet mobility and a mean gradient that has progressively increased to 59 mm Hg. His cath shows patent grafts with a 75% stenosis at the insertion site of his PDA vein graft. He has no chest pain and I think this can be managed medically. I think AVR is indicated in this patient who is now symptomatic and otherwise in good shape for his age and still wants to be active and independent. The patient was counseled at length regarding treatment alternatives for management of severe symptomatic aortic stenosis. The risks and benefits of surgical intervention has been discussed in detail. Long-term prognosis with medical therapy was discussed. Alternative approaches such as conventional surgical aortic valve replacement, transcatheter aortic valve replacement, and palliative medical therapy were compared and contrasted at length. This discussion was placed in the context of the patient's own specific clinical presentation and past medical history. I think he would be a high risk patient for open surgical AVR given his prior CABG and advanced age. I think TAVR would be a much better option for him.  His cardiac CT shows that he has anatomy favorable for TAVR using a Sapien 3 valve. His abdominal and pelvic CT shows severe concentric iliac calcified plaque with a small lumen. His CT has been evaluated by a multi-disciplinary team and it is felt that it may be possible to pass a 53F sheath via the right common femoral artery. If it will not pass then a transapical approach  will be necessary.   Following the decision to proceed with transcatheter aortic valve replacement, a discussion was held regarding what types of management strategies would be attempted intraoperatively in the event of life-threatening complications, including whether or not the patient would be considered a candidate for the use of cardiopulmonary bypass and/or conversion to open sternotomy for attempted surgical intervention. The patient is aware of the fact that transient use of cardiopulmonary bypass may be necessary but understands that he would not be a candidate for emergent redo sternotomy for management of aortic or valve-related complications. The patient has been advised of a variety of complications that might develop including but not limited to risks of death, stroke, paravalvular leak, aortic dissection or other major vascular complications, aortic annulus rupture, device embolization, cardiac rupture or perforation, mitral regurgitation, acute myocardial  infarction, arrhythmia, heart block or bradycardia requiring permanent pacemaker placement, congestive heart failure, respiratory failure, renal failure, pneumonia, infection, other late complications related to structural valve deterioration or migration, or other complications that might ultimately cause a temporary or permanent loss of functional independence or other long term morbidity. The patient provides full informed consent for the procedure as described and all questions were answered.    Plan:  Right transfemoral or transapical TAVR using a 26 mm Sapien 3 valve.  Alleen Borne, MD

## 2016-11-03 ENCOUNTER — Encounter (HOSPITAL_COMMUNITY): Payer: Self-pay | Admitting: *Deleted

## 2016-11-03 ENCOUNTER — Inpatient Hospital Stay (HOSPITAL_COMMUNITY): Payer: Medicare Other | Admitting: Critical Care Medicine

## 2016-11-03 ENCOUNTER — Inpatient Hospital Stay (HOSPITAL_COMMUNITY): Payer: Medicare Other

## 2016-11-03 ENCOUNTER — Other Ambulatory Visit: Payer: Self-pay

## 2016-11-03 ENCOUNTER — Encounter (HOSPITAL_COMMUNITY)
Admission: RE | Disposition: A | Discharge status: Home / Self Care | Payer: Self-pay | Source: Ambulatory Visit | Attending: Cardiovascular Disease

## 2016-11-03 ENCOUNTER — Inpatient Hospital Stay (HOSPITAL_COMMUNITY)
Admission: RE | Admit: 2016-11-03 | Discharge: 2016-11-06 | DRG: 267 | Disposition: A | Discharge status: Home / Self Care | Payer: Medicare Other | Source: Ambulatory Visit | Attending: Cardiovascular Disease | Admitting: Cardiovascular Disease

## 2016-11-03 DIAGNOSIS — R001 Bradycardia, unspecified: Secondary | ICD-10-CM | POA: Diagnosis not present

## 2016-11-03 DIAGNOSIS — I2583 Coronary atherosclerosis due to lipid rich plaque: Secondary | ICD-10-CM | POA: Diagnosis present

## 2016-11-03 DIAGNOSIS — E119 Type 2 diabetes mellitus without complications: Secondary | ICD-10-CM

## 2016-11-03 DIAGNOSIS — I350 Nonrheumatic aortic (valve) stenosis: Secondary | ICD-10-CM

## 2016-11-03 DIAGNOSIS — I700 Atherosclerosis of aorta: Secondary | ICD-10-CM | POA: Diagnosis present

## 2016-11-03 DIAGNOSIS — Z954 Presence of other heart-valve replacement: Secondary | ICD-10-CM | POA: Diagnosis not present

## 2016-11-03 DIAGNOSIS — Z951 Presence of aortocoronary bypass graft: Secondary | ICD-10-CM | POA: Diagnosis not present

## 2016-11-03 DIAGNOSIS — I442 Atrioventricular block, complete: Secondary | ICD-10-CM | POA: Diagnosis present

## 2016-11-03 DIAGNOSIS — Y92239 Unspecified place in hospital as the place of occurrence of the external cause: Secondary | ICD-10-CM | POA: Diagnosis not present

## 2016-11-03 DIAGNOSIS — L7622 Postprocedural hemorrhage and hematoma of skin and subcutaneous tissue following other procedure: Secondary | ICD-10-CM | POA: Diagnosis not present

## 2016-11-03 DIAGNOSIS — Z7984 Long term (current) use of oral hypoglycemic drugs: Secondary | ICD-10-CM | POA: Diagnosis not present

## 2016-11-03 DIAGNOSIS — D696 Thrombocytopenia, unspecified: Secondary | ICD-10-CM | POA: Diagnosis not present

## 2016-11-03 DIAGNOSIS — I471 Supraventricular tachycardia: Secondary | ICD-10-CM | POA: Diagnosis not present

## 2016-11-03 DIAGNOSIS — I253 Aneurysm of heart: Secondary | ICD-10-CM | POA: Diagnosis present

## 2016-11-03 DIAGNOSIS — Z8249 Family history of ischemic heart disease and other diseases of the circulatory system: Secondary | ICD-10-CM

## 2016-11-03 DIAGNOSIS — F329 Major depressive disorder, single episode, unspecified: Secondary | ICD-10-CM | POA: Diagnosis present

## 2016-11-03 DIAGNOSIS — J439 Emphysema, unspecified: Secondary | ICD-10-CM | POA: Diagnosis present

## 2016-11-03 DIAGNOSIS — I459 Conduction disorder, unspecified: Secondary | ICD-10-CM | POA: Insufficient documentation

## 2016-11-03 DIAGNOSIS — Z87891 Personal history of nicotine dependence: Secondary | ICD-10-CM

## 2016-11-03 DIAGNOSIS — E785 Hyperlipidemia, unspecified: Secondary | ICD-10-CM | POA: Diagnosis present

## 2016-11-03 DIAGNOSIS — E039 Hypothyroidism, unspecified: Secondary | ICD-10-CM | POA: Diagnosis present

## 2016-11-03 DIAGNOSIS — Z006 Encounter for examination for normal comparison and control in clinical research program: Secondary | ICD-10-CM | POA: Diagnosis not present

## 2016-11-03 DIAGNOSIS — Z952 Presence of prosthetic heart valve: Secondary | ICD-10-CM | POA: Diagnosis not present

## 2016-11-03 DIAGNOSIS — M1990 Unspecified osteoarthritis, unspecified site: Secondary | ICD-10-CM | POA: Diagnosis present

## 2016-11-03 DIAGNOSIS — Y838 Other surgical procedures as the cause of abnormal reaction of the patient, or of later complication, without mention of misadventure at the time of the procedure: Secondary | ICD-10-CM | POA: Diagnosis not present

## 2016-11-03 DIAGNOSIS — I6523 Occlusion and stenosis of bilateral carotid arteries: Secondary | ICD-10-CM | POA: Diagnosis not present

## 2016-11-03 DIAGNOSIS — I447 Left bundle-branch block, unspecified: Secondary | ICD-10-CM | POA: Diagnosis present

## 2016-11-03 DIAGNOSIS — D649 Anemia, unspecified: Secondary | ICD-10-CM

## 2016-11-03 DIAGNOSIS — Z950 Presence of cardiac pacemaker: Secondary | ICD-10-CM

## 2016-11-03 DIAGNOSIS — I2510 Atherosclerotic heart disease of native coronary artery without angina pectoris: Secondary | ICD-10-CM | POA: Diagnosis not present

## 2016-11-03 DIAGNOSIS — I10 Essential (primary) hypertension: Secondary | ICD-10-CM | POA: Diagnosis not present

## 2016-11-03 DIAGNOSIS — Z79899 Other long term (current) drug therapy: Secondary | ICD-10-CM

## 2016-11-03 DIAGNOSIS — Z833 Family history of diabetes mellitus: Secondary | ICD-10-CM | POA: Diagnosis not present

## 2016-11-03 DIAGNOSIS — I083 Combined rheumatic disorders of mitral, aortic and tricuspid valves: Principal | ICD-10-CM | POA: Diagnosis present

## 2016-11-03 DIAGNOSIS — I441 Atrioventricular block, second degree: Secondary | ICD-10-CM | POA: Diagnosis not present

## 2016-11-03 DIAGNOSIS — J449 Chronic obstructive pulmonary disease, unspecified: Secondary | ICD-10-CM | POA: Diagnosis not present

## 2016-11-03 HISTORY — PX: TRANSCATHETER AORTIC VALVE REPLACEMENT, TRANSFEMORAL: SHX6400

## 2016-11-03 HISTORY — DX: Atrioventricular block, second degree: I44.1

## 2016-11-03 HISTORY — PX: TEE WITHOUT CARDIOVERSION: SHX5443

## 2016-11-03 LAB — POCT I-STAT, CHEM 8
BUN: 15 mg/dL (ref 6–20)
BUN: 15 mg/dL (ref 6–20)
BUN: 15 mg/dL (ref 6–20)
BUN: 15 mg/dL (ref 6–20)
BUN: 14 mg/dL (ref 6–20)
BUN: 19 mg/dL (ref 6–20)
CALCIUM ION: 1.12 mmol/L — AB (ref 1.15–1.40)
CALCIUM ION: 1.12 mmol/L — AB (ref 1.15–1.40)
CALCIUM ION: 1.18 mmol/L (ref 1.15–1.40)
CALCIUM ION: 1.16 mmol/L (ref 1.15–1.40)
CHLORIDE: 105 mmol/L (ref 101–111)
CHLORIDE: 103 mmol/L (ref 101–111)
CREATININE: 0.7 mg/dL (ref 0.61–1.24)
CREATININE: 0.9 mg/dL (ref 0.61–1.24)
Calcium, Ion: 1.23 mmol/L (ref 1.15–1.40)
Calcium, Ion: 1.13 mmol/L — ABNORMAL LOW (ref 1.15–1.40)
Chloride: 106 mmol/L (ref 101–111)
Chloride: 104 mmol/L (ref 101–111)
Chloride: 103 mmol/L (ref 101–111)
Chloride: 104 mmol/L (ref 101–111)
Creatinine, Ser: 0.9 mg/dL (ref 0.61–1.24)
Creatinine, Ser: 0.9 mg/dL (ref 0.61–1.24)
Creatinine, Ser: 1 mg/dL (ref 0.61–1.24)
Creatinine, Ser: 1.1 mg/dL (ref 0.61–1.24)
GLUCOSE: 135 mg/dL — AB (ref 65–99)
GLUCOSE: 133 mg/dL — AB (ref 65–99)
Glucose, Bld: 128 mg/dL — ABNORMAL HIGH (ref 65–99)
Glucose, Bld: 159 mg/dL — ABNORMAL HIGH (ref 65–99)
Glucose, Bld: 169 mg/dL — ABNORMAL HIGH (ref 65–99)
Glucose, Bld: 160 mg/dL — ABNORMAL HIGH (ref 65–99)
HCT: 32 % — ABNORMAL LOW (ref 39.0–52.0)
HCT: 28 % — ABNORMAL LOW (ref 39.0–52.0)
HCT: 29 % — ABNORMAL LOW (ref 39.0–52.0)
HCT: 29 % — ABNORMAL LOW (ref 39.0–52.0)
HEMATOCRIT: 30 % — AB (ref 39.0–52.0)
HEMATOCRIT: 28 % — AB (ref 39.0–52.0)
HEMOGLOBIN: 10.9 g/dL — AB (ref 13.0–17.0)
HEMOGLOBIN: 9.9 g/dL — AB (ref 13.0–17.0)
HEMOGLOBIN: 10.2 g/dL — AB (ref 13.0–17.0)
HEMOGLOBIN: 9.9 g/dL — AB (ref 13.0–17.0)
HEMOGLOBIN: 9.5 g/dL — AB (ref 13.0–17.0)
Hemoglobin: 9.5 g/dL — ABNORMAL LOW (ref 13.0–17.0)
POTASSIUM: 3.7 mmol/L (ref 3.5–5.1)
POTASSIUM: 3.6 mmol/L (ref 3.5–5.1)
Potassium: 3.7 mmol/L (ref 3.5–5.1)
Potassium: 3.8 mmol/L (ref 3.5–5.1)
Potassium: 3.6 mmol/L (ref 3.5–5.1)
Potassium: 4.6 mmol/L (ref 3.5–5.1)
SODIUM: 139 mmol/L (ref 135–145)
SODIUM: 140 mmol/L (ref 135–145)
SODIUM: 140 mmol/L (ref 135–145)
SODIUM: 139 mmol/L (ref 135–145)
Sodium: 138 mmol/L (ref 135–145)
Sodium: 141 mmol/L (ref 135–145)
TCO2: 26 mmol/L (ref 0–100)
TCO2: 24 mmol/L (ref 0–100)
TCO2: 24 mmol/L (ref 0–100)
TCO2: 29 mmol/L (ref 0–100)
TCO2: 27 mmol/L (ref 0–100)
TCO2: 29 mmol/L (ref 0–100)

## 2016-11-03 LAB — GLUCOSE, CAPILLARY
GLUCOSE-CAPILLARY: 125 mg/dL — AB (ref 65–99)
Glucose-Capillary: 107 mg/dL — ABNORMAL HIGH (ref 65–99)
Glucose-Capillary: 96 mg/dL (ref 65–99)
Glucose-Capillary: 128 mg/dL — ABNORMAL HIGH (ref 65–99)

## 2016-11-03 LAB — POCT I-STAT 3, ART BLOOD GAS (G3+)
ACID-BASE DEFICIT: 3 mmol/L — AB (ref 0.0–2.0)
ACID-BASE DEFICIT: 3 mmol/L — AB (ref 0.0–2.0)
Acid-Base Excess: 6 mmol/L — ABNORMAL HIGH (ref 0.0–2.0)
BICARBONATE: 21.5 mmol/L (ref 20.0–28.0)
Bicarbonate: 22.5 mmol/L (ref 20.0–28.0)
Bicarbonate: 30.9 mmol/L — ABNORMAL HIGH (ref 20.0–28.0)
O2 SAT: 100 %
O2 Saturation: 97 %
O2 Saturation: 99 %
PCO2 ART: 44.5 mmHg (ref 32.0–48.0)
PH ART: 7.428 (ref 7.350–7.450)
PH ART: 7.45 (ref 7.350–7.450)
TCO2: 24 mmol/L (ref 0–100)
TCO2: 23 mmol/L (ref 0–100)
TCO2: 32 mmol/L (ref 0–100)
pCO2 arterial: 43.2 mmHg (ref 32.0–48.0)
pCO2 arterial: 31.7 mmHg — ABNORMAL LOW (ref 32.0–48.0)
pH, Arterial: 7.325 — ABNORMAL LOW (ref 7.350–7.450)
pO2, Arterial: 235 mmHg — ABNORMAL HIGH (ref 83.0–108.0)
pO2, Arterial: 74 mmHg — ABNORMAL LOW (ref 83.0–108.0)
pO2, Arterial: 118 mmHg — ABNORMAL HIGH (ref 83.0–108.0)

## 2016-11-03 LAB — CBC
HEMATOCRIT: 32 % — AB (ref 39.0–52.0)
HEMOGLOBIN: 10.7 g/dL — AB (ref 13.0–17.0)
MCH: 30.5 pg (ref 26.0–34.0)
MCHC: 33.4 g/dL (ref 30.0–36.0)
MCV: 91.2 fL (ref 78.0–100.0)
Platelets: 126 10*3/uL — ABNORMAL LOW (ref 150–400)
RBC: 3.51 MIL/uL — AB (ref 4.22–5.81)
RDW: 13.2 % (ref 11.5–15.5)
WBC: 5.9 10*3/uL (ref 4.0–10.5)

## 2016-11-03 LAB — POCT I-STAT 4, (NA,K, GLUC, HGB,HCT)
GLUCOSE: 116 mg/dL — AB (ref 65–99)
HEMATOCRIT: 30 % — AB (ref 39.0–52.0)
HEMOGLOBIN: 10.2 g/dL — AB (ref 13.0–17.0)
POTASSIUM: 3.7 mmol/L (ref 3.5–5.1)
SODIUM: 140 mmol/L (ref 135–145)

## 2016-11-03 LAB — PREPARE RBC (CROSSMATCH)

## 2016-11-03 LAB — PROTIME-INR
INR: 1.38
PROTHROMBIN TIME: 17.1 s — AB (ref 11.4–15.2)

## 2016-11-03 LAB — APTT: aPTT: 55 seconds — ABNORMAL HIGH (ref 24–36)

## 2016-11-03 SURGERY — IMPLANTATION, AORTIC VALVE, TRANSCATHETER, FEMORAL APPROACH
Anesthesia: General | Site: Chest

## 2016-11-03 MED ORDER — OXYCODONE HCL 5 MG PO TABS
5.0000 mg | ORAL_TABLET | ORAL | Status: DC | PRN
  Administered 2016-11-03: 10 mg via ORAL
  Filled 2016-11-03: qty 2

## 2016-11-03 MED ORDER — DEXTROSE 5 % IV SOLN
1.5000 g | Freq: Two times a day (BID) | INTRAVENOUS | Status: AC
  Administered 2016-11-03 – 2016-11-05 (×4): 1.5 g via INTRAVENOUS
  Filled 2016-11-03 (×4): qty 1.5

## 2016-11-03 MED ORDER — SODIUM CHLORIDE 0.9% FLUSH
3.0000 mL | Freq: Two times a day (BID) | INTRAVENOUS | Status: DC
  Administered 2016-11-03 – 2016-11-06 (×4): 3 mL via INTRAVENOUS

## 2016-11-03 MED ORDER — SODIUM CHLORIDE 0.9 % IV SOLN: 250.0000 mL | INTRAVENOUS | Status: DC | PRN

## 2016-11-03 MED ORDER — BENAZEPRIL HCL 5 MG PO TABS
20.0000 mg | ORAL_TABLET | Freq: Every day | ORAL | Status: DC
  Administered 2016-11-04 – 2016-11-06 (×3): 20 mg via ORAL
  Filled 2016-11-03 (×2): qty 1
  Filled 2016-11-03: qty 2
  Filled 2016-11-03 (×2): qty 4

## 2016-11-03 MED ORDER — HEPARIN SODIUM (PORCINE) 1000 UNIT/ML IJ SOLN
INTRAMUSCULAR | Status: AC
  Filled 2016-11-03: qty 1

## 2016-11-03 MED ORDER — IODIXANOL 320 MG/ML IV SOLN
INTRAVENOUS | Status: DC | PRN
  Administered 2016-11-03: 80.4 mL via INTRA_ARTERIAL

## 2016-11-03 MED ORDER — CARVEDILOL 6.25 MG PO TABS
6.2500 mg | ORAL_TABLET | Freq: Two times a day (BID) | ORAL | Status: DC
  Administered 2016-11-03 – 2016-11-06 (×5): 6.25 mg via ORAL
  Filled 2016-11-03 (×5): qty 1

## 2016-11-03 MED ORDER — VANCOMYCIN HCL IN DEXTROSE 1-5 GM/200ML-% IV SOLN
1000.0000 mg | Freq: Once | INTRAVENOUS | Status: AC
  Administered 2016-11-03: 1000 mg via INTRAVENOUS
  Filled 2016-11-03: qty 200

## 2016-11-03 MED ORDER — SODIUM CHLORIDE 0.9 % IV SOLN
INTRAVENOUS | Status: DC | PRN
  Administered 2016-11-03: 1500 mL

## 2016-11-03 MED ORDER — ALBUMIN HUMAN 5 % IV SOLN
INTRAVENOUS | Status: DC | PRN
  Administered 2016-11-03: 08:00:00 via INTRAVENOUS

## 2016-11-03 MED ORDER — PHENYLEPHRINE HCL 10 MG/ML IJ SOLN
0.0000 ug/min | INTRAVENOUS | Status: DC
  Administered 2016-11-03: 10 ug/min via INTRAVENOUS
  Filled 2016-11-03: qty 2

## 2016-11-03 MED ORDER — NITROGLYCERIN IN D5W 200-5 MCG/ML-% IV SOLN: 0.0000 ug/min | INTRAVENOUS | Status: DC

## 2016-11-03 MED ORDER — TRAMADOL HCL 50 MG PO TABS
50.0000 mg | ORAL_TABLET | ORAL | Status: DC | PRN
  Administered 2016-11-03: 50 mg via ORAL
  Filled 2016-11-03: qty 1

## 2016-11-03 MED ORDER — FAMOTIDINE IN NACL 20-0.9 MG/50ML-% IV SOLN
20.0000 mg | Freq: Two times a day (BID) | INTRAVENOUS | Status: DC
  Administered 2016-11-03: 20 mg via INTRAVENOUS
  Filled 2016-11-03: qty 50

## 2016-11-03 MED ORDER — ROCURONIUM BROMIDE 10 MG/ML (PF) SYRINGE
PREFILLED_SYRINGE | INTRAVENOUS | Status: AC
  Filled 2016-11-03: qty 10

## 2016-11-03 MED ORDER — PROTAMINE SULFATE 10 MG/ML IV SOLN
INTRAVENOUS | Status: AC
  Filled 2016-11-03: qty 25

## 2016-11-03 MED ORDER — PHENYLEPHRINE 40 MCG/ML (10ML) SYRINGE FOR IV PUSH (FOR BLOOD PRESSURE SUPPORT)
PREFILLED_SYRINGE | INTRAVENOUS | Status: DC | PRN
  Administered 2016-11-03 (×3): 80 ug via INTRAVENOUS

## 2016-11-03 MED ORDER — PROTAMINE SULFATE 10 MG/ML IV SOLN
INTRAVENOUS | Status: DC | PRN
  Administered 2016-11-03 (×2): 40 mg via INTRAVENOUS
  Administered 2016-11-03 (×2): 20 mg via INTRAVENOUS

## 2016-11-03 MED ORDER — CHLORHEXIDINE GLUCONATE 4 % EX LIQD: 60.0000 mL | Freq: Once | CUTANEOUS | Status: DC

## 2016-11-03 MED ORDER — PHENYLEPHRINE 40 MCG/ML (10ML) SYRINGE FOR IV PUSH (FOR BLOOD PRESSURE SUPPORT)
PREFILLED_SYRINGE | INTRAVENOUS | Status: AC
  Filled 2016-11-03: qty 10

## 2016-11-03 MED ORDER — SIMVASTATIN 20 MG PO TABS
20.0000 mg | ORAL_TABLET | Freq: Every evening | ORAL | Status: DC
  Administered 2016-11-03 – 2016-11-04 (×2): 20 mg via ORAL
  Filled 2016-11-03 (×2): qty 1

## 2016-11-03 MED ORDER — INSULIN ASPART 100 UNIT/ML ~~LOC~~ SOLN
0.0000 [IU] | Freq: Three times a day (TID) | SUBCUTANEOUS | Status: DC
  Administered 2016-11-03 – 2016-11-06 (×3): 2 [IU] via SUBCUTANEOUS

## 2016-11-03 MED ORDER — 0.9 % SODIUM CHLORIDE (POUR BTL) OPTIME
TOPICAL | Status: DC | PRN
  Administered 2016-11-03: 6000 mL

## 2016-11-03 MED ORDER — EPINEPHRINE PF 1 MG/ML IJ SOLN
INTRAMUSCULAR | Status: AC
  Filled 2016-11-03: qty 2

## 2016-11-03 MED ORDER — PANTOPRAZOLE SODIUM 40 MG PO TBEC
40.0000 mg | DELAYED_RELEASE_TABLET | Freq: Every day | ORAL | Status: DC
  Administered 2016-11-04 – 2016-11-06 (×3): 40 mg via ORAL
  Filled 2016-11-03 (×3): qty 1

## 2016-11-03 MED ORDER — ONDANSETRON HCL 4 MG/2ML IJ SOLN
INTRAMUSCULAR | Status: DC | PRN
  Administered 2016-11-03: 4 mg via INTRAVENOUS

## 2016-11-03 MED ORDER — SODIUM CHLORIDE 0.9 % IV SOLN: INTRAVENOUS | Status: DC

## 2016-11-03 MED ORDER — SODIUM CHLORIDE 0.9 % IV SOLN
1.0000 mL/kg/h | INTRAVENOUS | Status: AC
  Administered 2016-11-03: 1 mL/kg/h via INTRAVENOUS

## 2016-11-03 MED ORDER — GLYCOPYRROLATE 0.2 MG/ML IJ SOLN
INTRAMUSCULAR | Status: DC | PRN
  Administered 2016-11-03: 0.2 mg via INTRAVENOUS

## 2016-11-03 MED ORDER — SODIUM CHLORIDE 0.9% FLUSH: 3.0000 mL | INTRAVENOUS | Status: DC | PRN

## 2016-11-03 MED ORDER — EPHEDRINE 5 MG/ML INJ
INTRAVENOUS | Status: AC
  Filled 2016-11-03: qty 10

## 2016-11-03 MED ORDER — LACTATED RINGERS IV SOLN
INTRAVENOUS | Status: DC | PRN
  Administered 2016-11-03: 07:00:00 via INTRAVENOUS

## 2016-11-03 MED ORDER — HEPARIN SODIUM (PORCINE) 1000 UNIT/ML IJ SOLN
INTRAMUSCULAR | Status: DC | PRN
  Administered 2016-11-03: 10000 [IU] via INTRAVENOUS

## 2016-11-03 MED ORDER — CHLORHEXIDINE GLUCONATE 4 % EX LIQD
30.0000 mL | CUTANEOUS | Status: DC
Start: 2016-11-03 — End: 2016-11-03

## 2016-11-03 MED ORDER — SODIUM CHLORIDE 0.9 % IV SOLN
30.0000 meq | Freq: Once | INTRAVENOUS | Status: AC
  Administered 2016-11-03: 30 meq via INTRAVENOUS
  Filled 2016-11-03: qty 15

## 2016-11-03 MED ORDER — CLOPIDOGREL BISULFATE 75 MG PO TABS
75.0000 mg | ORAL_TABLET | Freq: Every day | ORAL | Status: DC
  Administered 2016-11-04 – 2016-11-05 (×2): 75 mg via ORAL
  Filled 2016-11-03 (×2): qty 1

## 2016-11-03 MED ORDER — FENTANYL CITRATE (PF) 250 MCG/5ML IJ SOLN
INTRAMUSCULAR | Status: DC | PRN
  Administered 2016-11-03: 75 ug via INTRAVENOUS
  Administered 2016-11-03: 50 ug via INTRAVENOUS

## 2016-11-03 MED ORDER — AMLODIPINE BESYLATE 10 MG PO TABS
10.0000 mg | ORAL_TABLET | Freq: Every day | ORAL | Status: DC
  Administered 2016-11-04 – 2016-11-06 (×3): 10 mg via ORAL
  Filled 2016-11-03 (×3): qty 1

## 2016-11-03 MED ORDER — EPHEDRINE SULFATE 50 MG/ML IJ SOLN
INTRAMUSCULAR | Status: DC | PRN
  Administered 2016-11-03: 10 mg via INTRAVENOUS

## 2016-11-03 MED ORDER — CHLORHEXIDINE GLUCONATE 0.12 % MT SOLN
15.0000 mL | OROMUCOSAL | Status: AC
  Administered 2016-11-03: 15 mL via OROMUCOSAL
  Filled 2016-11-03: qty 15

## 2016-11-03 MED ORDER — SUGAMMADEX SODIUM 200 MG/2ML IV SOLN
INTRAVENOUS | Status: DC | PRN
  Administered 2016-11-03: 150 mg via INTRAVENOUS

## 2016-11-03 MED ORDER — LACTATED RINGERS IV SOLN: 500.0000 mL | Freq: Once | INTRAVENOUS | Status: DC | PRN

## 2016-11-03 MED ORDER — PROPOFOL 10 MG/ML IV BOLUS
INTRAVENOUS | Status: AC
  Filled 2016-11-03: qty 20

## 2016-11-03 MED ORDER — CHLORHEXIDINE GLUCONATE 0.12 % MT SOLN
15.0000 mL | Freq: Once | OROMUCOSAL | Status: AC
Start: 2016-11-04 — End: 2016-11-03
  Administered 2016-11-03: 15 mL via OROMUCOSAL
  Filled 2016-11-03: qty 15

## 2016-11-03 MED ORDER — ONDANSETRON HCL 4 MG/2ML IJ SOLN: 4.0000 mg | Freq: Four times a day (QID) | INTRAMUSCULAR | Status: DC | PRN

## 2016-11-03 MED ORDER — PROPOFOL 10 MG/ML IV BOLUS
INTRAVENOUS | Status: DC | PRN
  Administered 2016-11-03: 80 mg via INTRAVENOUS

## 2016-11-03 MED ORDER — LEVOTHYROXINE SODIUM 88 MCG PO TABS
88.0000 ug | ORAL_TABLET | Freq: Every day | ORAL | Status: DC
  Administered 2016-11-04 – 2016-11-06 (×3): 88 ug via ORAL
  Filled 2016-11-03 (×3): qty 1

## 2016-11-03 MED ORDER — LIDOCAINE 2% (20 MG/ML) 5 ML SYRINGE
INTRAMUSCULAR | Status: DC | PRN
  Administered 2016-11-03: 60 mg via INTRAVENOUS
  Administered 2016-11-03: 40 mg via INTRAVENOUS

## 2016-11-03 MED ORDER — MORPHINE SULFATE (PF) 2 MG/ML IV SOLN: 2.0000 mg | INTRAVENOUS | Status: DC | PRN

## 2016-11-03 MED ORDER — GLYCOPYRROLATE 0.2 MG/ML IV SOSY
PREFILLED_SYRINGE | INTRAVENOUS | Status: AC
  Filled 2016-11-03: qty 3

## 2016-11-03 MED ORDER — POTASSIUM CHLORIDE 10 MEQ/50ML IV SOLN
10.0000 meq | INTRAVENOUS | Status: DC
Start: 2016-11-03 — End: 2016-11-03

## 2016-11-03 MED ORDER — ALBUMIN HUMAN 5 % IV SOLN
250.0000 mL | INTRAVENOUS | Status: DC | PRN
  Administered 2016-11-04 (×2): 250 mL via INTRAVENOUS
  Filled 2016-11-03 (×2): qty 250

## 2016-11-03 MED ORDER — ROCURONIUM BROMIDE 10 MG/ML (PF) SYRINGE
PREFILLED_SYRINGE | INTRAVENOUS | Status: DC | PRN
  Administered 2016-11-03: 50 mg via INTRAVENOUS

## 2016-11-03 MED ORDER — LIDOCAINE 2% (20 MG/ML) 5 ML SYRINGE
INTRAMUSCULAR | Status: AC
  Filled 2016-11-03: qty 5

## 2016-11-03 MED ORDER — LACTATED RINGERS IV SOLN
INTRAVENOUS | Status: DC | PRN
  Administered 2016-11-03 (×2): via INTRAVENOUS

## 2016-11-03 MED ORDER — FENTANYL CITRATE (PF) 250 MCG/5ML IJ SOLN
INTRAMUSCULAR | Status: AC
  Filled 2016-11-03: qty 5

## 2016-11-03 MED FILL — Magnesium Sulfate Inj 50%: INTRAMUSCULAR | Qty: 10 | Status: AC

## 2016-11-03 MED FILL — Heparin Sodium (Porcine) Inj 1000 Unit/ML: INTRAMUSCULAR | Qty: 30 | Status: AC

## 2016-11-03 MED FILL — Potassium Chloride Inj 2 mEq/ML: INTRAVENOUS | Qty: 40 | Status: AC

## 2016-11-03 SURGICAL SUPPLY — 108 items
ADAPTER UNIV SWAN GANZ BIP (ADAPTER) ×2 IMPLANT
ADAPTER UNV SWAN GANZ BIP (ADAPTER) ×1
ADH SKN CLS APL DERMABOND .7 (GAUZE/BANDAGES/DRESSINGS) ×2
ADPR CATH UNV NS SG CATH (ADAPTER) ×2
ATTRACTOMAT 16X20 MAGNETIC DRP (DRAPES) IMPLANT
BAG BANDED W/RUBBER/TAPE 36X54 (MISCELLANEOUS) ×3 IMPLANT
BAG DECANTER FOR FLEXI CONT (MISCELLANEOUS) IMPLANT
BAG EQP BAND 135X91 W/RBR TAPE (MISCELLANEOUS) ×2
BAG SNAP BAND KOVER 36X36 (MISCELLANEOUS) ×6 IMPLANT
BLADE 10 SAFETY STRL DISP (BLADE) ×3 IMPLANT
BLADE STERNUM SYSTEM 6 (BLADE) ×3 IMPLANT
BLADE SURG ROTATE 9660 (MISCELLANEOUS) IMPLANT
CABLE PACING FASLOC BIEGE (MISCELLANEOUS) ×3 IMPLANT
CABLE PACING FASLOC BLUE (MISCELLANEOUS) ×3 IMPLANT
CANISTER SUCTION 2500CC (MISCELLANEOUS) IMPLANT
CANNULA FEM VENOUS REMOTE 22FR (CANNULA) IMPLANT
CANNULA OPTISITE PERFUSION 16F (CANNULA) IMPLANT
CANNULA OPTISITE PERFUSION 18F (CANNULA) IMPLANT
CATH DIAG EXPO 6F VENT PIG 145 (CATHETERS) ×6 IMPLANT
CATH EXPO 5FR AL1 (CATHETERS) ×3 IMPLANT
CATH S G BIP PACING (SET/KITS/TRAYS/PACK) ×6 IMPLANT
CLIP TI MEDIUM 24 (CLIP) ×3 IMPLANT
CLIP TI WIDE RED SMALL 24 (CLIP) ×3 IMPLANT
CONT SPEC 4OZ CLIKSEAL STRL BL (MISCELLANEOUS) ×18 IMPLANT
COVER DOME SNAP 22 D (MISCELLANEOUS) ×3 IMPLANT
COVER MAYO STAND STRL (DRAPES) ×3 IMPLANT
COVER TABLE BACK 60X90 (DRAPES) ×3 IMPLANT
CRADLE DONUT ADULT HEAD (MISCELLANEOUS) ×3 IMPLANT
DERMABOND ADVANCED (GAUZE/BANDAGES/DRESSINGS) ×1
DERMABOND ADVANCED .7 DNX12 (GAUZE/BANDAGES/DRESSINGS) ×2 IMPLANT
DEVICE CLOSURE PERCLS PRGLD 6F (VASCULAR PRODUCTS) IMPLANT
DRAPE INCISE IOBAN 66X45 STRL (DRAPES) IMPLANT
DRAPE SLUSH MACHINE 52X66 (DRAPES) ×3 IMPLANT
DRAPE SURG IRRIG POUCH 19X23 (DRAPES) ×1 IMPLANT
DRAPE TABLE COVER HEAVY DUTY (DRAPES) ×3 IMPLANT
DRSG TEGADERM 4X4.75 (GAUZE/BANDAGES/DRESSINGS) ×4 IMPLANT
ELECT REM PT RETURN 9FT ADLT (ELECTROSURGICAL) ×6
ELECTRODE REM PT RTRN 9FT ADLT (ELECTROSURGICAL) ×4 IMPLANT
FELT TEFLON 1X6 (MISCELLANEOUS) ×1 IMPLANT
FELT TEFLON 6X6 (MISCELLANEOUS) ×3 IMPLANT
FEMORAL VENOUS CANN RAP (CANNULA) IMPLANT
GAUZE SPONGE 4X4 12PLY STRL (GAUZE/BANDAGES/DRESSINGS) ×3 IMPLANT
GLOVE BIO SURGEON STRL SZ 6.5 (GLOVE) ×2 IMPLANT
GLOVE BIO SURGEON STRL SZ7.5 (GLOVE) ×3 IMPLANT
GLOVE BIO SURGEON STRL SZ8 (GLOVE) ×5 IMPLANT
GLOVE BIOGEL PI IND STRL 6.5 (GLOVE) IMPLANT
GLOVE BIOGEL PI INDICATOR 6.5 (GLOVE) ×3
GLOVE EUDERMIC 7 POWDERFREE (GLOVE) ×3 IMPLANT
GLOVE ORTHO TXT STRL SZ7.5 (GLOVE) ×3 IMPLANT
GOWN STRL REUS W/ TWL LRG LVL3 (GOWN DISPOSABLE) ×6 IMPLANT
GOWN STRL REUS W/ TWL XL LVL3 (GOWN DISPOSABLE) ×12 IMPLANT
GOWN STRL REUS W/TWL LRG LVL3 (GOWN DISPOSABLE) ×9
GOWN STRL REUS W/TWL XL LVL3 (GOWN DISPOSABLE) ×18
GUIDEWIRE SAF TJ AMPL .035X180 (WIRE) ×3 IMPLANT
GUIDEWIRE SAFE TJ AMPLATZ EXST (WIRE) ×3 IMPLANT
GUIDEWIRE STRAIGHT .035 260CM (WIRE) ×3 IMPLANT
INSERT FOGARTY 61MM (MISCELLANEOUS) ×3 IMPLANT
INSERT FOGARTY SM (MISCELLANEOUS) ×6 IMPLANT
INSERT FOGARTY XLG (MISCELLANEOUS) IMPLANT
KIT BASIN OR (CUSTOM PROCEDURE TRAY) ×3 IMPLANT
KIT DILATOR VASC 18G NDL (KITS) IMPLANT
KIT HEART LEFT (KITS) ×3 IMPLANT
KIT ROOM TURNOVER OR (KITS) ×3 IMPLANT
KIT SUCTION CATH 14FR (SUCTIONS) ×6 IMPLANT
NDL PERC 18GX7CM (NEEDLE) ×2 IMPLANT
NEEDLE PERC 18GX7CM (NEEDLE) ×3 IMPLANT
NS IRRIG 1000ML POUR BTL (IV SOLUTION) ×9 IMPLANT
PACK AORTA (CUSTOM PROCEDURE TRAY) ×3 IMPLANT
PAD ARMBOARD 7.5X6 YLW CONV (MISCELLANEOUS) ×6 IMPLANT
PAD ELECT DEFIB RADIOL ZOLL (MISCELLANEOUS) ×3 IMPLANT
PATCH TACHOSII LRG 9.5X4.8 (VASCULAR PRODUCTS) IMPLANT
PERCLOSE PROGLIDE 6F (VASCULAR PRODUCTS) ×6
SET MICROPUNCTURE 5F STIFF (MISCELLANEOUS) ×3 IMPLANT
SHEATH AVANTI 11CM 8FR (MISCELLANEOUS) ×3 IMPLANT
SHEATH PINNACLE 6F 10CM (SHEATH) ×6 IMPLANT
SLEEVE REPOSITIONING LENGTH 30 (MISCELLANEOUS) ×3 IMPLANT
SPONGE GAUZE 4X4 12PLY STER LF (GAUZE/BANDAGES/DRESSINGS) ×2 IMPLANT
SPONGE LAP 4X18 X RAY DECT (DISPOSABLE) ×3 IMPLANT
STOPCOCK MORSE 400PSI 3WAY (MISCELLANEOUS) ×18 IMPLANT
SUT ETHIBOND X763 2 0 SH 1 (SUTURE) ×3 IMPLANT
SUT GORETEX CV 4 TH 22 36 (SUTURE) ×3 IMPLANT
SUT GORETEX CV4 TH-18 (SUTURE) ×9 IMPLANT
SUT GORETEX TH-18 36 INCH (SUTURE) ×6 IMPLANT
SUT MNCRL AB 3-0 PS2 18 (SUTURE) ×3 IMPLANT
SUT PROLENE 2 0 MH 48 (SUTURE) ×1 IMPLANT
SUT PROLENE 3 0 SH1 36 (SUTURE) IMPLANT
SUT PROLENE 4 0 RB 1 (SUTURE) ×3
SUT PROLENE 4-0 RB1 .5 CRCL 36 (SUTURE) ×2 IMPLANT
SUT PROLENE 5 0 C 1 36 (SUTURE) ×6 IMPLANT
SUT PROLENE 6 0 C 1 30 (SUTURE) ×6 IMPLANT
SUT SILK  1 MH (SUTURE) ×1
SUT SILK 1 MH (SUTURE) ×2 IMPLANT
SUT SILK 2 0 SH CR/8 (SUTURE) ×1 IMPLANT
SUT VIC AB 2-0 CT1 27 (SUTURE) ×3
SUT VIC AB 2-0 CT1 TAPERPNT 27 (SUTURE) ×2 IMPLANT
SUT VIC AB 2-0 CTX 36 (SUTURE) IMPLANT
SUT VIC AB 3-0 SH 8-18 (SUTURE) ×6 IMPLANT
SYR 30ML LL (SYRINGE) ×6 IMPLANT
SYR 50ML LL SCALE MARK (SYRINGE) ×3 IMPLANT
SYRINGE 10CC LL (SYRINGE) ×9 IMPLANT
TOWEL OR 17X26 10 PK STRL BLUE (TOWEL DISPOSABLE) ×6 IMPLANT
TRANSDUCER W/STOPCOCK (MISCELLANEOUS) ×6 IMPLANT
TRAY FOLEY IC TEMP SENS 14FR (CATHETERS) ×3 IMPLANT
TUBE SUCT INTRACARD DLP 20F (MISCELLANEOUS) IMPLANT
TUBING HIGH PRESSURE 120CM (CONNECTOR) ×3 IMPLANT
VALVE HEART TRANSCATH SZ3 26MM (Prosthesis & Implant Heart) ×1 IMPLANT
WIRE AMPLATZ SS-J .035X180CM (WIRE) ×3 IMPLANT
WIRE BENTSON .035X145CM (WIRE) ×3 IMPLANT

## 2016-11-03 NOTE — Anesthesia Procedure Notes (Signed)
Procedure Name: Intubation Date/Time: 11/03/2016 8:02 AM Performed by: Jefm MilesENNIE, Jasdeep Dejarnett E Pre-anesthesia Checklist: Patient identified, Emergency Drugs available, Suction available and Patient being monitored Patient Re-evaluated:Patient Re-evaluated prior to inductionOxygen Delivery Method: Circle System Utilized Preoxygenation: Pre-oxygenation with 100% oxygen Intubation Type: IV induction Ventilation: Mask ventilation without difficulty Laryngoscope Size: Mac and 3 Grade View: Grade I Tube type: Oral Tube size: 8.0 mm Number of attempts: 1 Airway Equipment and Method: Stylet and Oral airway Placement Confirmation: ETT inserted through vocal cords under direct vision,  positive ETCO2 and breath sounds checked- equal and bilateral Secured at: 22 cm Tube secured with: Tape Dental Injury: Teeth and Oropharynx as per pre-operative assessment  Comments: Intubation performed by Rockie NeighboursSRNA Maura Dougherty

## 2016-11-03 NOTE — Progress Notes (Signed)
  Echocardiogram Echocardiogram Transesophageal has been performed.  Janalyn HarderWest, Darrill Vreeland R 11/03/2016, 9:57 AM

## 2016-11-03 NOTE — Significant Event (Signed)
Noted bloody oozing on right groin, around foley catheter when RN checked on groin sites. RN changed dressing, held manual pressure, returned patient to a flat position.

## 2016-11-03 NOTE — Op Note (Signed)
[facility]   MULTIDISCIPLINARY HEART VALVE TEAM   TAVR OPERATIVE NOTE   Date of Procedure:  11/03/2016  Preoperative Diagnosis: Severe Aortic Stenosis   Postoperative Diagnosis: Same   Procedure:   Transcatheter Aortic Valve Replacement - Percutaneous Transfemoral Approach  Edwards Sapien 3 THV (size 26 mm, model # 9600TFX, serial # [id number])   Co-Surgeons:  Evelene CroonBryan Bartle, MD and Tonny BollmanMichael Latoiya Maradiaga, MD  Anesthesiologist:  Jairo Benarswell Jackson, MD  Echocardiographer:  Tobias AlexanderKatarina Nelson, MD  Pre-operative Echo Findings:  Severe aortic stenosis  Normal left ventricular systolic function  Post-operative Echo Findings:  No paravalvular leak  Normal left ventricular systolic function  BRIEF CLINICAL NOTE AND INDICATIONS FOR SURGERY Patient is an 82 year old male with history of aortic stenosis, coronary artery disease status post coronary artery bypass grafting in the remote past, hypertension, hyperlipidemia, and type 2 diabetes mellitus who has been referred for second surgical opinion to discuss treatment options for management of severe symptomatic aortic stenosis. The patient's cardiac history dates back to 2000 when he presented with symptomatic coronary artery disease.  He underwent coronary artery bypass grafting by Dr. Laneta SimmersBartle at that time and recovered uneventfully. He has been followed ever since by Dr. Allyson SabalBerry in overall he has done remarkably well from a cardiac standpoint. He was noted to have a murmur on physical exam in over the years he has developed aortic stenosis that has progressed in severity on serial follow-up echocardiograms. He has remained active physically and asymptomatic until recently. Over the past year the patient reports development of symptoms of exertional shortness of breath and fatigue. Follow-up echocardiogram performed 10/02/2016 revealed normal left ventricular systolic function with peak velocity across the aortic valve measured greater than  5 m/s corresponded to mean transvalvular gradient estimated 59 mmHg.  The patient underwent diagnostic cardiac catheterization on 10/05/2016 that revealed severe native coronary artery disease but continued patency of bypass grafts to all major coronary artery distributions.  He has now undergone CTA studies and surgical evaluation and is found to be a candidate for TAVR.   During the course of the patient's preoperative work up they have been evaluated comprehensively by a multidisciplinary team of specialists coordinated through the [facility].  They have been demonstrated to suffer from symptomatic severe aortic stenosis as noted above. The patient has been counseled extensively as to the relative risks and benefits of all options for the treatment of severe aortic stenosis including long term medical therapy, conventional surgery for aortic valve replacement, and transcatheter aortic valve replacement.  The patient has been independently evaluated by two cardiac surgeons including Dr. Cornelius Moraswen and Dr. Laneta SimmersBartle, and they are felt to be at high risk for conventional surgical aortic valve replacement based upon a predicted risk of mortality using the Society of Thoracic Surgeons risk calculator of 9.2%.  Based upon review of all of the patient's preoperative diagnostic tests they are felt to be candidate for transcatheter aortic valve replacement using the transfemoral approach as an alternative to high risk conventional surgery.    Following the decision to proceed with transcatheter aortic valve replacement, a discussion has been held regarding what types of management strategies would be attempted intraoperatively in the event of life-threatening complications, including whether or not the patient would be considered a candidate for the use of cardiopulmonary bypass and/or conversion to open sternotomy for attempted surgical intervention.  The  patient has been advised of a variety of complications that  might develop peculiar to this approach including but not limited to risks of death, stroke, paravalvular leak, aortic dissection or other major vascular complications, aortic annulus rupture, device embolization, cardiac rupture or perforation, acute myocardial infarction, arrhythmia, heart block or bradycardia requiring permanent pacemaker placement, congestive heart failure, respiratory failure, renal failure, pneumonia, infection, other late complications related to structural valve deterioration or migration, or other complications that might ultimately cause a temporary or permanent loss of functional independence or other long term morbidity.  The patient provides full informed consent for the procedure as described and all questions were answered preoperatively.  DETAILS OF THE OPERATIVE PROCEDURE PREPARATION:   The patient is brought to the operating room on the above mentioned date and central monitoring was established by the anesthesia team including placement of a radial arterial line. The patient is placed in the supine position on the operating table.  Intravenous antibiotics are administered. General endotracheal anesthesia is induced uneventfully.  A Foley catheter is placed.  Baseline transesophageal echocardiogram was performed. The patient's chest, abdomen, both groins, and both lower extremities are prepared and draped in a sterile manner. A time out procedure is performed.  PERIPHERAL ACCESS:   Using the modified Seldinger technique, femoral arterial and venous access was obtained with placement of 6 Fr sheaths on the left side.  A pigtail diagnostic catheter was passed through the left femoral arterial sheath under fluoroscopic guidance into the aortic root.  A temporary transvenous pacemaker catheter was passed through the left femoral venous sheath under fluoroscopic guidance into the right ventricle.  The pacemaker was  tested to ensure stable lead placement and pacemaker capture. Aortic root angiography was performed in order to determine the optimal angiographic angle for valve deployment.   TRANSFEMORAL ACCESS:  A micropuncture technique is used to access the right femoral artery under fluoroscopic guidance.  Femoral angiography is performed to verify access in the common femoral artery. 2 Perclose devices are deployed at 10' and 2' positions to 'PreClose' the femoral artery. An 8 French sheath is placed and then an Amplatz Superstiff wire is advanced through the sheath. This is changed out for a 14 French transfemoral E-Sheath after progressively dilating over the Superstiff wire.  An AL-2 catheter was used to direct a straight-tip exchange length wire across the native aortic valve into the left ventricle. This was exchanged out for a pigtail catheter and position was confirmed in the LV apex. Simultaneous LV and Ao pressures were recorded.  The pigtail catheter was exchanged for an Amplatz Extra-stiff wire in the LV apex.  Echocardiography was utilized to confirm appropriate wire position and no sign of entanglement in the mitral subvalvular apparatus.  TRANSCATHETER HEART VALVE DEPLOYMENT:  An Edwards Sapien 3 transcatheter heart valve (size 26 mm, model #9600TFX, serial #[id number]) was prepared and crimped per manufacturer's guidelines, and the proper orientation of the valve is confirmed on the [employer] Commander delivery system. The valve was advanced through the introducer sheath using normal technique until in an appropriate position in the abdominal aorta beyond the sheath tip. The balloon was then retracted and using the fine-tuning wheel was centered on the valve. The valve was then advanced across the aortic arch using appropriate flexion of the catheter. The valve was carefully positioned across the aortic valve annulus. The Commander catheter was retracted using normal technique. Once final position of the  valve has been confirmed by angiographic assessment, the valve is deployed while temporarily holding ventilation and during rapid ventricular pacing to maintain systolic  blood pressure < 50 mmHg and pulse pressure < 10 mmHg. The balloon inflation is held for >3 seconds after reaching full deployment volume. Once the balloon has fully deflated the balloon is retracted into the ascending aorta and valve function is assessed using echocardiography. There is felt to be no paravalvular leak and no central aortic insufficiency.  The patient's hemodynamic recovery following valve deployment is good.  The deployment balloon and guidewire are both removed. Echo demostrated acceptable post-procedural gradients, stable mitral valve function, and aortic insufficiency.  PROCEDURE COMPLETION:  The sheath was removed and femoral artery closure is performed using the 2 previously deployed Perclose devices.   Distal abdominal aortography was performed to evaluate for any arterial injury related to the procedure. There was no evidence dissection, perforation, or other vascular injury in the abdominal aorta, iliac artery, or femoral artery. Protamine was administered once femoral arterial repair was complete. The temporary pacemaker, pigtail catheters and femoral sheaths were removed with manual pressure used for hemostasis.   The patient tolerated the procedure well and is transported to the surgical intensive care in stable condition. There were no immediate intraoperative complications. All sponge instrument and needle counts are verified correct at completion of the operation.   The patient received a total of 80 mL of intravenous contrast during the procedure.  Tonny Bollman, MD 11/03/2016 9:52 AM

## 2016-11-03 NOTE — Interval H&P Note (Signed)
History and Physical Interval Note:  11/03/2016 5:41 AM  Thomas English  has presented today for surgery, with the diagnosis of SEVERE AS  The various methods of treatment have been discussed with the patient and family. After consideration of risks, benefits and other options for treatment, the patient has consented to  Procedure(s): TRANSCATHETER AORTIC VALVE REPLACEMENT, TRANSFEMORAL, POSSIBLE TRANSAPICAL APPROACH (N/A) TRANSESOPHAGEAL ECHOCARDIOGRAM (TEE) (N/A) as a surgical intervention .  The patient's history has been reviewed, patient examined, no change in status, stable for surgery.  I have reviewed the patient's chart and labs.  Questions were answered to the patient's satisfaction.     [name]

## 2016-11-03 NOTE — Progress Notes (Signed)
CT Surgery PM Rounds  Mild bleeding from R groin puncture site Patient resting comfortably On low dose iv NTG  for hypertension Will keep at bed rest CBC  W/ am labs

## 2016-11-03 NOTE — Anesthesia Procedure Notes (Signed)
Procedures

## 2016-11-03 NOTE — Care Management Note (Signed)
Case Management Note  Patient Details  Name: Doyce LooseOdell C Brien MRN: [id number] Date of Birth: 06/25/1932  Subjective/Objective:          S/p TAVR          Action/Plan:  PTA from home with wife. CM will continue to follow for discharge needs   Expected Discharge Date:                  Expected Discharge Plan:  Home/Self Care  In-House Referral:     Discharge planning Services  CM Consult  Post Acute Care Choice:    Choice offered to:     DME Arranged:    DME Agency:     HH Arranged:    HH Agency:     Status of Service:  In process, will continue to follow  If discussed at Long Length of Stay Meetings, dates discussed:    Additional Comments:  Cherylann ParrClaxton, Deshane Cotroneo S, RN 11/03/2016, 3:20 PM

## 2016-11-03 NOTE — Anesthesia Procedure Notes (Addendum)
Central Venous Catheter Insertion Performed by: anesthesiologist 11/03/2016 7:05 AM Patient location: Pre-op. Preanesthetic checklist: patient identified, IV checked, risks and benefits discussed, surgical consent, monitors and equipment checked, pre-op evaluation, timeout performed and anesthesia consent Position: Trendelenburg Lidocaine 1% used for infiltration Landmarks identified and Seldinger technique used Catheter size: 8.5 Fr Central line was placed.Sheath introducer Procedure performed using ultrasound guided technique. Attempts: 1 Following insertion, line sutured, dressing applied and Biopatch. Post procedure assessment: blood return through all ports, free fluid flow and no air. Patient tolerated the procedure well with no immediate complications. Additional procedure comments: CVP: Timeout, sterile prep, drape, FBP R neck.  Trendelenburg position.  1% lido local, finder and trocar RIJ 1st pass with US guidance.  Cordis introducer placed over J wire. Biopatch and sterile dressing on.  Patient tolerated well.  VSS.  Jenita Seashore, MD.

## 2016-11-03 NOTE — Op Note (Signed)
[facility]   MULTIDISCIPLINARY HEART VALVE TEAM   TAVR OPERATIVE NOTE   Date of Procedure:  11/03/2016  Preoperative Diagnosis: Severe Aortic Stenosis   Postoperative Diagnosis: Same   Procedure:    Transcatheter Aortic Valve Replacement - Percutaneous Left Transfemoral Approach  Edwards Sapien 3 THV (size 26 mm, model # 9600TFX, serial # [id number])   Co-Surgeons:  Alleen BorneBryan K. Anis Cinelli, MD and Tonny BollmanMichael Cooper, MD   Anesthesiologist:  Jairo Benarswell Jackson, MD  Echocardiographer:  Tobias AlexanderKatarina Nelson, MD  Pre-operative Echo Findings:   severe aortic stenosis   normal left ventricular systolic function   Post-operative Echo Findings:  No paravalvular leak  normal left ventricular systolic function   BRIEF CLINICAL NOTE AND INDICATIONS FOR SURGERY  The patient is an 82 year old gentleman with hypertension, hyperlipidemia and diabetes as well as coronary artery disease s/p CABG by me in 2000. He has done well over the past 17 years but has developed progressive exertional shortness of breath over the past 6 months. He says that his normal activities around the house do not cause any symptoms but if he walks fast or pulls his trash cans out he gets short of breath. He has a known history of aortic stenosis and his 2D echo in 02/2015 showed severe AS with a mean gradient of 44 mm Hg and a trileaflet severely calcified valve. His most recent echo on 10/02/2016 shows further progression with an increase in the mean gradient to 59 mm Hg with trivial AI and normal LV function. He underwent cardiac cath on 10/05/2016 showing severe multi-vessel coronary disease with patent grafts. . I think AVR is indicated in this patient who is now symptomatic and otherwise in good shape for his age and still wants to be active and independent. The patient was counseled at length regarding treatment alternatives for management of severe symptomatic aortic stenosis. The risks and benefits of surgical  intervention has been discussed in detail. Long-term prognosis with medical therapy was discussed. Alternative approaches such as conventional surgical aortic valve replacement, transcatheter aortic valve replacement, and palliative medical therapy were compared and contrasted at length. This discussion was placed in the context of the patient's own specific clinical presentation and past medical history. I think he would be a high risk patient for open surgical AVR given his prior CABG and advanced age. I think TAVR would be a much better option for him.  His cardiac CT shows that he has anatomy favorable for TAVR using a Sapien 3 valve. His abdominal and pelvic CT shows severe concentric iliac calcified plaque with a small lumen. His CT has been evaluated by a multi-disciplinary team and it is felt that it may be possible to pass a 62F sheath via the right common femoral artery. If it will not pass then a transapical approach will be necessary.   Following the decision to proceed with transcatheter aortic valve replacement, a discussion was held regarding what types of management strategies would be attempted intraoperatively in the event of life-threatening complications, including whether or not the patient would be considered a candidate for the use of cardiopulmonary bypass and/or conversion to open sternotomy for attempted surgical intervention. The patient is aware of the fact that transient use of cardiopulmonary bypass may be necessary but understands that he would not be a candidate for emergent redo sternotomy for management of aortic or valve-related complications. The patient has been advised of a variety of complications that might develop including but not limited to risks  of death, stroke, paravalvular leak, aortic dissection or other major vascular complications, aortic annulus rupture, device embolization, cardiac rupture or perforation, mitral regurgitation, acute myocardial infarction,  arrhythmia, heart block or bradycardia requiring permanent pacemaker placement, congestive heart failure, respiratory failure, renal failure, pneumonia, infection, other late complications related to structural valve deterioration or migration, or other complications that might ultimately cause a temporary or permanent loss of functional independence or other long term morbidity. The patient provides full informed consent for the procedure as described and all questions were answered.   DETAILS OF THE OPERATIVE PROCEDURE  PREPARATION:    The patient is brought to the operating room on the above mentioned date and central monitoring was established by the anesthesia team including placement of Swan-Ganz catheter and radial arterial line. The patient is placed in the supine position on the operating table.  Intravenous antibiotics are administered. General endotracheal anesthesia is induced uneventfully.  A Foley catheter is placed.  Baseline transesophagea echocardiogram was performed. The patient's chest, abdomen, both groins, and both lower extremities are prepared and draped in a sterile manner. A time out procedure is performed.   Peripheral and transfemoral access were performed by Dr. Excell Seltzer and will be included in his note.   BALLOON AORTIC VALVULOPLASTY:   Not performed   TRANSCATHETER HEART VALVE DEPLOYMENT:   An Edwards Sapien 3 transcatheter heart valve (size 26 mm, model #9600TFX, serial #[id number]) was prepared and crimped per manufacturer's guidelines, and the proper orientation of the valve is confirmed on the [employer] delivery system. The valve was advanced through the introducer sheath using normal technique until in an appropriate position in the abdominal aorta beyond the sheath tip. The balloon was then retracted and using the fine-tuning wheel was centered on the valve. The valve was then advanced across the aortic arch using appropriate flexion of the catheter. The  valve was carefully positioned across the aortic valve annulus. The Commander catheter was retracted using normal technique. Once final position of the valve has been confirmed by angiographic assessment, the valve is deployed while temporarily holding ventilation and during rapid ventricular pacing to maintain systolic blood pressure < 50 mmHg and pulse pressure < 10 mmHg. The balloon inflation is held for >3 seconds after reaching full deployment volume. Once the balloon has fully deflated the balloon is retracted into the ascending aorta and valve function is assessed using echocardiography. There is felt to be no paravalvular leak and no central aortic insufficiency.  The patient's hemodynamic recovery following valve deployment is good.  The deployment balloon and guidewire are both removed. Echo demostrated acceptable post-procedural gradients, stable mitral valve function, and no aortic insufficiency.   PROCEDURE COMPLETION:   The sheath was removed and femoral artery closure performed by Dr Excell Seltzer. Please see his separate report for details. Distal abdominal aortography was performed to evaluate for any arterial injury related to the procedure. There was no evidence dissection, perforation, or other vascular injury in the abdominal aorta, iliac artery, or femoral artery.  Protamine was administered once femoral arterial repair was complete. The temporary pacemaker, pigtail catheters and femoral sheaths were removed with manual pressure used for hemostasis.   The patient tolerated the procedure well and is transported to the surgical intensive care in stable condition. There were no immediate intraoperative complications. All sponge instrument and needle counts are verified correct at completion of the operation.   No blood products were administered during the operation.  The patient received a total of 80 mL of intravenous  contrast during the procedure.   Alleen BorneBryan K Tashara Suder,  MD 11/03/2016

## 2016-11-03 NOTE — Transfer of Care (Signed)
Immediate Anesthesia Transfer of Care Note  Patient: Thomas English  Procedure(s) Performed: Procedure(s): TRANSCATHETER AORTIC VALVE REPLACEMENT, TRANSFEMORAL (N/A) TRANSESOPHAGEAL ECHOCARDIOGRAM (TEE) (N/A)  Patient Location: ICU  Anesthesia Type:General  Level of Consciousness: awake, alert  and oriented  Airway & Oxygen Therapy: Patient Spontanous Breathing  Post-op Assessment: Report given to RN  Post vital signs: Reviewed and stable  Last Vitals:  Vitals:   11/03/16 0916 11/03/16 0921  BP:    Pulse: 96 78  Resp:    Temp:      Last Pain:  Vitals:   11/03/16 0603  TempSrc: Oral      Patients Stated Pain Goal: 3 (11/03/16 0557)  Complications: No apparent anesthesia complications

## 2016-11-03 NOTE — Anesthesia Postprocedure Evaluation (Signed)
Anesthesia Post Note  Patient: Thomas English  Procedure(s) Performed: Procedure(s) (LRB): TRANSCATHETER AORTIC VALVE REPLACEMENT, TRANSFEMORAL (N/A) TRANSESOPHAGEAL ECHOCARDIOGRAM (TEE) (N/A)  Patient location during evaluation: SICU Anesthesia Type: General Level of consciousness: awake and alert, oriented and patient cooperative Pain management: pain level controlled Vital Signs Assessment: post-procedure vital signs reviewed and stable Respiratory status: spontaneous breathing, nonlabored ventilation, respiratory function stable and patient connected to nasal cannula oxygen Cardiovascular status: blood pressure returned to baseline and stable Postop Assessment: no signs of nausea or vomiting Anesthetic complications: no    Last Vitals:  Vitals:   11/03/16 1500 11/03/16 1600  BP: 123/65 132/74  Pulse: 97 84  Resp: 18 13  Temp: 36.6 C 36.9 C    Last Pain:  Vitals:   11/03/16 1614  TempSrc:   PainSc: 6                  Colleene Swarthout,E. Renna Kilmer

## 2016-11-04 ENCOUNTER — Inpatient Hospital Stay (HOSPITAL_COMMUNITY): Payer: Medicare Other

## 2016-11-04 ENCOUNTER — Encounter (HOSPITAL_COMMUNITY): Payer: Self-pay | Admitting: Cardiovascular Disease

## 2016-11-04 DIAGNOSIS — I350 Nonrheumatic aortic (valve) stenosis: Secondary | ICD-10-CM

## 2016-11-04 DIAGNOSIS — Z952 Presence of prosthetic heart valve: Secondary | ICD-10-CM

## 2016-11-04 DIAGNOSIS — Z954 Presence of other heart-valve replacement: Secondary | ICD-10-CM

## 2016-11-04 LAB — GLUCOSE, CAPILLARY
GLUCOSE-CAPILLARY: 102 mg/dL — AB (ref 65–99)
GLUCOSE-CAPILLARY: 146 mg/dL — AB (ref 65–99)
Glucose-Capillary: 113 mg/dL — ABNORMAL HIGH (ref 65–99)
Glucose-Capillary: 111 mg/dL — ABNORMAL HIGH (ref 65–99)
Glucose-Capillary: 121 mg/dL — ABNORMAL HIGH (ref 65–99)

## 2016-11-04 LAB — CBC
HEMATOCRIT: 30.4 % — AB (ref 39.0–52.0)
Hemoglobin: 10.2 g/dL — ABNORMAL LOW (ref 13.0–17.0)
MCH: 31 pg (ref 26.0–34.0)
MCHC: 33.6 g/dL (ref 30.0–36.0)
MCV: 92.4 fL (ref 78.0–100.0)
PLATELETS: 114 10*3/uL — AB (ref 150–400)
RBC: 3.29 MIL/uL — ABNORMAL LOW (ref 4.22–5.81)
RDW: 13.3 % (ref 11.5–15.5)
WBC: 6 10*3/uL (ref 4.0–10.5)

## 2016-11-04 LAB — BASIC METABOLIC PANEL
ANION GAP: 5 (ref 5–15)
BUN: 9 mg/dL (ref 6–20)
CALCIUM: 8.2 mg/dL — AB (ref 8.9–10.3)
CO2: 25 mmol/L (ref 22–32)
CREATININE: 1.09 mg/dL (ref 0.61–1.24)
Chloride: 108 mmol/L (ref 101–111)
GFR calc Af Amer: >60 mL/min (ref >60)
GLUCOSE: 130 mg/dL — AB (ref 65–99)
Potassium: 4 mmol/L (ref 3.5–5.1)
Sodium: 138 mmol/L (ref 135–145)

## 2016-11-04 LAB — ECHOCARDIOGRAM LIMITED
HEIGHTINCHES: 71 in
Weight: 2768 oz

## 2016-11-04 LAB — MAGNESIUM: Magnesium: 2.1 mg/dL (ref 1.7–2.4)

## 2016-11-04 MED ORDER — ASPIRIN 81 MG PO CHEW
81.0000 mg | CHEWABLE_TABLET | Freq: Every day | ORAL | Status: DC
  Administered 2016-11-04 – 2016-11-06 (×3): 81 mg via ORAL
  Filled 2016-11-04 (×3): qty 1

## 2016-11-04 NOTE — Progress Notes (Signed)
RN sts pt recently walked over from 2S. Will f/u tomorrow. Ethelda ChickKristan Wilhelm Ganaway CES, ACSM 2:35 PM 11/04/2016

## 2016-11-04 NOTE — Progress Notes (Signed)
Patient admitted to 2W01 A&Ox4, VSS. Tele applied and CCMD notified. Patient oriented to room and call light placed within reach. No questions or concerns at this time.

## 2016-11-04 NOTE — Progress Notes (Signed)
  Echocardiogram 2D Echocardiogram has been performed.  Thomas English, Anup Brigham M 11/04/2016, 9:25 AM

## 2016-11-04 NOTE — Progress Notes (Signed)
1 Day Post-Op Procedure(s) (LRB): TRANSCATHETER AORTIC VALVE REPLACEMENT, TRANSFEMORAL (N/A) TRANSESOPHAGEAL ECHOCARDIOGRAM (TEE) (N/A) Subjective: No complaints  Objective: Vital signs in last 24 hours: Temp:  [94.3 F (34.6 C)-99.1 F (37.3 C)] 98.4 F (36.9 C) (11/15 0700) Pulse Rate:  [53-109] 64 (11/15 0700) Cardiac Rhythm: Normal sinus rhythm (11/15 0800) Resp:  [11-24] 17 (11/15 0700) BP: (95-141)/(43-74) 124/53 (11/15 0700) SpO2:  [95 %-100 %] 99 % (11/15 0700) Arterial Line BP: (97-173)/(36-64) 103/44 (11/14 2000)  Hemodynamic parameters for last 24 hours:    Intake/Output from previous day: 11/14 0701 - 11/15 0700 In: 2660 [P.O.:240; I.V.:2020; IV Piggyback:400] Out: 2905 [Urine:2855; Blood:50] Intake/Output this shift: No intake/output data recorded.  General appearance: alert and cooperative Neurologic: intact Heart: regular rate and rhythm, S1, S2 normal, no murmur, click, rub or gallop Lungs: clear to auscultation bilaterally Extremities: extremities normal, atraumatic, no cyanosis or edema Wound: groin access sites ok  Lab Results:  Recent Labs  11/03/16 1030  11/03/16 1349 11/04/16 0410  WBC 5.9  --   --  6.0  HGB 10.7*  < > 9.5* 10.2*  HCT 32.0*  < > 28.0* 30.4*  PLT 126*  --   --  114*  < > = values in this interval not displayed. BMET:  Recent Labs  11/03/16 1349 11/04/16 0410  NA 139 138  K 4.6 4.0  CL 104 108  CO2  --  25  GLUCOSE 160* 130*  BUN 19 9  CREATININE 1.10 1.09  CALCIUM  --  8.2*    PT/INR:  Recent Labs  11/03/16 1030  LABPROT 17.1*  INR 1.38   ABG    Component Value Date/Time   PHART 7.450 11/03/2016 1355   HCO3 30.9 (H) 11/03/2016 1355   TCO2 32 11/03/2016 1355   ACIDBASEDEF 3.0 (H) 11/03/2016 1031   O2SAT 99.0 11/03/2016 1355   CBG (last 3)   Recent Labs  11/03/16 1644 11/03/16 2143 11/04/16 0827  GLUCAP 125* 128* 113*    Assessment/Plan: S/P Procedure(s) (LRB): TRANSCATHETER AORTIC VALVE  REPLACEMENT, TRANSFEMORAL (N/A) TRANSESOPHAGEAL ECHOCARDIOGRAM (TEE) (N/A)  He is doing well POD 1 Plan 2D echo today ASA and Plavix Transfer to 2W and mobilize   LOS: 1 day    Alleen BorneBryan K Shaquina Gillham 11/04/2016

## 2016-11-04 NOTE — Progress Notes (Signed)
    Subjective:  Feels well. No CP or dyspnea.   Objective:  Vital Signs in the last 24 hours: Temp:  [94.3 F (34.6 C)-99.1 F (37.3 C)] 98.4 F (36.9 C) (11/15 0700) Pulse Rate:  [0-153] 64 (11/15 0700) Resp:  [11-24] 17 (11/15 0700) BP: (95-141)/(43-74) 124/53 (11/15 0700) SpO2:  [95 %-100 %] 99 % (11/15 0700) Arterial Line BP: (97-173)/(36-64) 103/44 (11/14 2000)  Intake/Output from previous day: 11/14 0701 - 11/15 0700 In: 2660 [P.O.:240; I.V.:2020; IV Piggyback:400] Out: 2905 [Urine:2855; Blood:50]  Physical Exam: Pt is alert and oriented, NAD HEENT: normal Neck: JVP - normal Lungs: CTA bilaterally CV: RRR without murmur or gallop Abd: soft, NT, Positive BS, no hepatomegaly Ext: no C/C/E, distal pulses intact and equal, bilateral groin sites clear Skin: warm/dry no rash   Lab Results:  Recent Labs  11/03/16 1030  11/03/16 1349 11/04/16 0410  WBC 5.9  --   --  6.0  HGB 10.7*  < > 9.5* 10.2*  PLT 126*  --   --  114*  < > = values in this interval not displayed.  Recent Labs  11/03/16 1349 11/04/16 0410  NA 139 138  K 4.6 4.0  CL 104 108  CO2  --  25  GLUCOSE 160* 130*  BUN 19 9  CREATININE 1.10 1.09   No results for input(s): TROPONINI in the last 72 hours.  Invalid input(s): CK, MB  Cardiac Studies: 2D Echo pending today  Tele: Normal sinus rhythm  Assessment/Plan:  1. Severe symptomatic aortic stenosis, POD #1 from TAVR 2. CAD, native vessel, without angina (remote CABG with patent grafts) 3. Type II DM 4. HTN - controlled 5. Anemia, mild  Progressing well. DC IJ sheath, tx tele, check echo today, mobilize with cardiac rehab, anticipate DC tomorrow. Start home antihypertensives.   Tonny Bollmanooper, Jacory Kamel, M.D. 11/04/2016, 7:26 AM

## 2016-11-05 ENCOUNTER — Encounter (HOSPITAL_COMMUNITY)
Admission: RE | Disposition: A | Discharge status: Home / Self Care | Payer: Self-pay | Source: Ambulatory Visit | Attending: Cardiovascular Disease

## 2016-11-05 DIAGNOSIS — I441 Atrioventricular block, second degree: Secondary | ICD-10-CM

## 2016-11-05 HISTORY — PX: EP IMPLANTABLE DEVICE: SHX172B

## 2016-11-05 LAB — TYPE AND SCREEN
ABO/RH(D): A POS
ANTIBODY SCREEN: NEGATIVE
UNIT DIVISION: 0
Unit division: 0

## 2016-11-05 LAB — GLUCOSE, CAPILLARY
GLUCOSE-CAPILLARY: 104 mg/dL — AB (ref 65–99)
GLUCOSE-CAPILLARY: 149 mg/dL — AB (ref 65–99)
GLUCOSE-CAPILLARY: 157 mg/dL — AB (ref 65–99)
Glucose-Capillary: 113 mg/dL — ABNORMAL HIGH (ref 65–99)

## 2016-11-05 SURGERY — PACEMAKER IMPLANT

## 2016-11-05 MED ORDER — ACETAMINOPHEN 325 MG PO TABS: 325.0000 mg | ORAL_TABLET | ORAL | Status: DC | PRN

## 2016-11-05 MED ORDER — FENTANYL CITRATE (PF) 100 MCG/2ML IJ SOLN
INTRAMUSCULAR | Status: DC | PRN
  Administered 2016-11-05 (×3): 12.5 ug via INTRAVENOUS

## 2016-11-05 MED ORDER — FENTANYL CITRATE (PF) 100 MCG/2ML IJ SOLN
INTRAMUSCULAR | Status: AC
  Filled 2016-11-05: qty 2

## 2016-11-05 MED ORDER — MIDAZOLAM HCL 5 MG/5ML IJ SOLN
INTRAMUSCULAR | Status: DC | PRN
  Administered 2016-11-05 (×3): 1 mg via INTRAVENOUS

## 2016-11-05 MED ORDER — LIDOCAINE HCL (PF) 1 % IJ SOLN
INTRAMUSCULAR | Status: AC
  Filled 2016-11-05: qty 60

## 2016-11-05 MED ORDER — CHLORHEXIDINE GLUCONATE 4 % EX LIQD: 60.0000 mL | Freq: Once | CUTANEOUS | Status: DC

## 2016-11-05 MED ORDER — MIDAZOLAM HCL 5 MG/5ML IJ SOLN
INTRAMUSCULAR | Status: AC
  Filled 2016-11-05: qty 5

## 2016-11-05 MED ORDER — HEPARIN (PORCINE) IN NACL 2-0.9 UNIT/ML-% IJ SOLN
INTRAMUSCULAR | Status: DC | PRN
  Administered 2016-11-05: 17:00:00

## 2016-11-05 MED ORDER — ONDANSETRON HCL 4 MG/2ML IJ SOLN: 4.0000 mg | Freq: Four times a day (QID) | INTRAMUSCULAR | Status: DC | PRN

## 2016-11-05 MED ORDER — CEFAZOLIN SODIUM-DEXTROSE 2-4 GM/100ML-% IV SOLN
2.0000 g | INTRAVENOUS | Status: AC
  Administered 2016-11-05: 2 g via INTRAVENOUS

## 2016-11-05 MED ORDER — SODIUM CHLORIDE 0.9 % IR SOLN
Status: AC
  Filled 2016-11-05: qty 2

## 2016-11-05 MED ORDER — VANCOMYCIN HCL IN DEXTROSE 1-5 GM/200ML-% IV SOLN
1000.0000 mg | Freq: Two times a day (BID) | INTRAVENOUS | Status: AC
  Administered 2016-11-06: 1000 mg via INTRAVENOUS
  Filled 2016-11-05: qty 200

## 2016-11-05 MED ORDER — LIDOCAINE HCL (PF) 1 % IJ SOLN
INTRAMUSCULAR | Status: DC | PRN
  Administered 2016-11-05: 50 mL via INTRADERMAL

## 2016-11-05 MED ORDER — SODIUM CHLORIDE 0.9 % IV SOLN: INTRAVENOUS | Status: DC

## 2016-11-05 MED ORDER — SODIUM CHLORIDE 0.9 % IR SOLN
80.0000 mg | Status: AC
  Administered 2016-11-05: 80 mg

## 2016-11-05 MED ORDER — CEFAZOLIN SODIUM-DEXTROSE 2-4 GM/100ML-% IV SOLN
INTRAVENOUS | Status: AC
  Filled 2016-11-05: qty 100

## 2016-11-05 SURGICAL SUPPLY — 7 items
CABLE SURGICAL S-101-97-12 (CABLE) ×1 IMPLANT
LEAD SOLIA S PRO MRI 53 (Lead) ×1 IMPLANT
LEAD SOLIA S PRO MRI 60 (Lead) ×1 IMPLANT
PACEMAKER EDORA 8DR-T MRI (Pacemaker) ×1 IMPLANT
PAD DEFIB LIFELINK (PAD) ×1 IMPLANT
SHEATH CLASSIC 7F (SHEATH) ×2 IMPLANT
TRAY PACEMAKER INSERTION (PACKS) ×1 IMPLANT

## 2016-11-05 NOTE — Interval H&P Note (Signed)
History and Physical Interval Note:  11/05/2016 4:32 PM  Thomas English  has presented today for surgery, with the diagnosis of hb  The various methods of treatment have been discussed with the patient and family. After consideration of risks, benefits and other options for treatment, the patient has consented to  Procedure(s): Pacemaker Implant (N/A) as a surgical intervention .  The patient's history has been reviewed, patient examined, no change in status, stable for surgery.  I have reviewed the patient's chart and labs.  Questions were answered to the patient's satisfaction.     Lewayne BuntingGregg Taylor

## 2016-11-05 NOTE — Progress Notes (Signed)
PA Gold made aware of patient's 1.64 sec pause.  RN will continue to monitor.

## 2016-11-05 NOTE — Progress Notes (Signed)
[address]       [place] [id number]             [phone]      2 Days Post-Op Procedure(s) (LRB): TRANSCATHETER AORTIC VALVE REPLACEMENT, TRANSFEMORAL (N/A) TRANSESOPHAGEAL ECHOCARDIOGRAM (TEE) (N/A) Subjective: Feels pretty well   Objective: Vital signs in last 24 hours: Temp:  [97.6 F (36.4 C)-99.4 F (37.4 C)] 98.4 F (36.9 C) (11/16 0321) Pulse Rate:  [61-90] 77 (11/16 0321) Cardiac Rhythm: Heart block (11/16 0700) Resp:  [14-21] 16 (11/16 0321) BP: (100-141)/(41-79) 141/54 (11/16 0321) SpO2:  [98 %-100 %] 98 % (11/16 0321) Weight:  [173 lb 1.6 oz (78.5 kg)] 173 lb 1.6 oz (78.5 kg) (11/16 0500)  Hemodynamic parameters for last 24 hours:    Intake/Output from previous day: 11/15 0701 - 11/16 0700 In: 310 [P.O.:240; I.V.:20; IV Piggyback:50] Out: 1050 [Urine:1050] Intake/Output this shift: No intake/output data recorded.  General appearance: alert, cooperative and no distress Heart: regular rate and rhythm and soft systolic murmur Lungs: clear to auscultation bilaterally Abdomen: benign Extremities: warm  Wound: groin sites without hematoma  Lab Results:  Recent Labs  11/03/16 1030  11/03/16 1349 11/04/16 0410  WBC 5.9  --   --  6.0  HGB 10.7*  < > 9.5* 10.2*  HCT 32.0*  < > 28.0* 30.4*  PLT 126*  --   --  114*  < > = values in this interval not displayed. BMET:  Recent Labs  11/03/16 1349 11/04/16 0410  NA 139 138  K 4.6 4.0  CL 104 108  CO2  --  25  GLUCOSE 160* 130*  BUN 19 9  CREATININE 1.10 1.09  CALCIUM  --  8.2*    PT/INR:  Recent Labs  11/03/16 1030  LABPROT 17.1*  INR 1.38   ABG    Component Value Date/Time   PHART 7.450 11/03/2016 1355   HCO3 30.9 (H) 11/03/2016 1355   TCO2 32 11/03/2016 1355   ACIDBASEDEF 3.0 (H) 11/03/2016 1031   O2SAT 99.0 11/03/2016 1355   CBG (last 3)   Recent Labs  11/04/16 1612 11/04/16 2136 11/05/16 0610  GLUCAP 111* 121* 113*    Meds Scheduled Meds: .  amLODipine  10 mg Oral Q breakfast  . aspirin  81 mg Oral Daily  . benazepril  20 mg Oral Q breakfast  . carvedilol  6.25 mg Oral BID WC  . clopidogrel  75 mg Oral Daily  . insulin aspart  0-15 Units Subcutaneous TID WC  . levothyroxine  88 mcg Oral QAC breakfast  . pantoprazole  40 mg Oral Daily  . simvastatin  20 mg Oral QPM  . sodium chloride flush  3 mL Intravenous Q12H   Continuous Infusions: . phenylephrine (NEO-SYNEPHRINE) Adult infusion Stopped (11/04/16 0200)   PRN Meds:.sodium chloride, morphine injection, ondansetron (ZOFRAN) IV, oxyCODONE, sodium chloride flush, traMADol  Xrays Dg Chest Port 1 View  Result Date: 11/04/2016 CLINICAL DATA:  Sore chest after aortic valve replacement EXAM: PORTABLE CHEST 1 VIEW COMPARISON:  Yesterday FINDINGS: Right IJ sheath in unchanged position. Stable heart size after aortic valve replacement. Pre-existing CABG. Hyperinflation and pulmonary venous congestion. No effusion or pneumothorax. IMPRESSION: 1. Stable from yesterday. 2. COPD and pulmonary vascular congestion. Electronically Signed   By: Marnee SpringJonathon  Watts M.D.   On: 11/04/2016 08:30   Dg Chest Port 1 View  Result Date: 11/03/2016 CLINICAL DATA:  Post aortic valve replacement. EXAM: PORTABLE CHEST 1 VIEW COMPARISON:  10/30/2016 FINDINGS: Prior median sternotomy, CABG and valve replacement. Mild hyperinflation of the lungs. Heart is borderline in size. Mild interstitial prominence throughout the lungs which could reflect interstitial edema. No pneumothorax or effusion. IMPRESSION: Increasing interstitial prominence throughout the lungs which could reflect interstitial edema. Electronically Signed   By: Charlett NoseKevin  Dover M.D.   On: 11/03/2016 10:35    Assessment/Plan: S/P Procedure(s) (LRB): TRANSCATHETER AORTIC VALVE REPLACEMENT, TRANSFEMORAL (N/A) TRANSESOPHAGEAL ECHOCARDIOGRAM (TEE) (N/A)  1 some tachy- brady rhythms, may need to d/c or decrease beta-blocker- defer to cardiology- observe  for now 2 no new labs 3 glucose control adequate 4 echo done and looks good  LOS: 2 days    Thomas English E 11/05/2016

## 2016-11-05 NOTE — Progress Notes (Addendum)
    Subjective:  Feels well. No CP or dyspnea.   Objective:  Vital Signs in the last 24 hours: Temp:  [97.6 F (36.4 C)-99.4 F (37.4 C)] 98.4 F (36.9 C) (11/16 0321) Pulse Rate:  [62-90] 75 (11/16 0752) Resp:  [14-21] 16 (11/16 0321) BP: (100-141)/(44-79) 127/61 (11/16 0752) SpO2:  [98 %-100 %] 98 % (11/16 0752) Weight:  [173 lb 1.6 oz (78.5 kg)] 173 lb 1.6 oz (78.5 kg) (11/16 0500)  Intake/Output from previous day: 11/15 0701 - 11/16 0700 In: 310 [P.O.:240; I.V.:20; IV Piggyback:50] Out: 1050 [Urine:1050]  Physical Exam: Pt is alert and oriented, NAD HEENT: normal Neck: JVP - normal Lungs: CTA bilaterally CV: RRR with 2/6 SEM at the RUSB Abd: soft, NT, Positive BS, no hepatomegaly Ext: no C/C/E, distal pulses intact and equal Skin: warm/dry no rash   Lab Results:  Recent Labs  11/03/16 1030  11/03/16 1349 11/04/16 0410  WBC 5.9  --   --  6.0  HGB 10.7*  < > 9.5* 10.2*  PLT 126*  --   --  114*  < > = values in this interval not displayed.  Recent Labs  11/03/16 1349 11/04/16 0410  NA 139 138  K 4.6 4.0  CL 104 108  CO2  --  25  GLUCOSE 160* 130*  BUN 19 9  CREATININE 1.10 1.09   No results for input(s): TROPONINI in the last 72 hours.  Invalid input(s): CK, MB  Cardiac Studies: 2D Echo (POD#1 study): Study Conclusions  - Left ventricle: The cavity size was normal. There was mild   concentric hypertrophy. Systolic function was normal. The   estimated ejection fraction was in the range of 55% to 60%. Wall   motion was normal; there were no regional wall motion   abnormalities. Features are consistent with a pseudonormal left   ventricular filling pattern, with concomitant abnormal relaxation   and increased filling pressure (grade 2 diastolic dysfunction). - Aortic valve: A 26mm Edwards Sapien 3 TAVR bioprosthesis was   present and well-seated. There was no regurgitation. Peak   velocity (S): 195 cm/s. Mean gradient (S): 8 mm Hg. Valve area   (VTI): 2.45 cm^2. Valve area (Vmax): 2.32 cm^2. Valve area   (Vmean): 2.46 cm^2. - Mitral valve: Calcified annulus. Transvalvular velocity was   within the normal range. There was no evidence for stenosis.   There was mild regurgitation. - Right ventricle: The cavity size was normal. Wall thickness was   normal. Systolic function was normal. - Tricuspid valve: There was mild regurgitation. - Pulmonary arteries: Systolic pressure was within the normal   range. PA peak pressure: 24 mm Hg (S).  Tele: Sinus rhythm, periods of SVT, new LBBB now for approximately 24 hours, brief episode of 3rd degree AVB  Assessment/Plan:  1. Severe symptomatic aortic stenosis, POD #2 from TAVR 2. CAD, native vessel, without angina (remote CABG with patent grafts) 3. Type II DM 4. HTN - controlled 5. Anemia, mild 6. New LBBB - transient AV block  Pt feels well. Echo reviewed and shows normal function of his aortic valve prosthesis. Tele reviewed as above - pt with new LBBB, in sinus but pSVT and episodes of bradycardia noted with one short episode of complete AV block. Will check 12-lead this am, formal EP consult, and another 24 hours of observation.  Thomas English, Thomas English, M.D. 11/05/2016, 8:07 AM

## 2016-11-05 NOTE — Consult Note (Signed)
ELECTROPHYSIOLOGY CONSULT NOTE    Patient ID: Thomas English MRN: [id number], DOB/AGE: 80-Oct-1933 80 y.o.  Admit date: 11/03/2016 Date of Consult: 11/05/2016  Primary Physician: Maryjean Ka, MD Primary Cardiologist: Nanda Quinton MD: Excell Seltzer  Reason for Consultation: new left bundle branch block and heart block post TAVR  HPI:  Thomas English is a 82 y.o. male with a past medical history significant for diabetes, hypertension, hyperlipidemia, CAD s/p CABG and aortic stenosis s/p TAVR this admission. Post op, he has developed left bundle branch block with intermittent 2:1 block. EP has been asked to evaluate for treatment options.  He denies chest pain, shortness of breath, LE edema, recent fevers, chills, nausea or vomiting. He has not had dizziness, pre-syncope, or syncope.   Past Medical History:  Diagnosis Date  . Aortic stenosis   . Arthritis   . CAD (coronary artery disease)    a. CAD s/p CABG 2000   b. cath on 10/05/16 with severe native vessel CAD and patent bypass grafts  . Carotid stenosis    bilateral internal, left greater than right, stable  . Diabetes mellitus type 2, noninsulin dependent (HCC)   . Hyperlipidemia   . Hypertension   . Hypothyroidism      Surgical History:  Past Surgical History:  Procedure Laterality Date  . BACK SURGERY    . CARDIAC CATHETERIZATION  11/10/1999   3-vessel disease, recommendation for CABG  . CARDIAC CATHETERIZATION N/A 10/05/2016   Procedure: Right/Left Heart Cath and Coronary/Graft Angiography;  Surgeon: Runell Gess, MD;  Location: [facility] INVASIVE CV LAB;  Service: Cardiovascular;  Laterality: N/A;  . CORONARY ARTERY BYPASS GRAFT  11/13/1999   LIMA to LAD and a vein graft to the first OM branch, second OM branch, and to the RCA (Dr. Wayland Salinas)  . EYE SURGERY     eye duct surgery  . NM MYOCAR PERF WALL MOTION  09/2013   normal lexiscan myoview  . PERIPHERAL VASCULAR CATHETERIZATION N/A 10/05/2016   Procedure:  Abdominal Aortogram;  Surgeon: Runell Gess, MD;  Location: [facility] INVASIVE CV LAB;  Service: Cardiovascular;  Laterality: N/A;  . TEE WITHOUT CARDIOVERSION N/A 11/03/2016   Procedure: TRANSESOPHAGEAL ECHOCARDIOGRAM (TEE);  Surgeon: Tonny Bollman, MD;  Location: [facility] OR;  Service: Open Heart Surgery;  Laterality: N/A;  . TRANSCATHETER AORTIC VALVE REPLACEMENT, TRANSFEMORAL N/A 11/03/2016   Procedure: TRANSCATHETER AORTIC VALVE REPLACEMENT, TRANSFEMORAL;  Surgeon: Tonny Bollman, MD;  Location: [facility] OR;  Service: Open Heart Surgery;  Laterality: N/A;  . TRANSTHORACIC ECHOCARDIOGRAM  12/2013   EF 55-60%, grade 1 diastolic dysfunction, high ventricular filling pressure; severe AV stenosis; calcified MV annulus; LA & RA mildly dilated; atrial septal aneurysm     Prescriptions Prior to Admission  Medication Sig Dispense Refill Last Dose  . amLODipine (NORVASC) 10 MG tablet Take 10 mg by mouth daily with breakfast.   6 11/03/2016 at 0430  . benazepril (LOTENSIN) 20 MG tablet Take 20 mg by mouth daily with breakfast.   6 11/02/2016 at Unknown time  . carvedilol (COREG) 6.25 MG tablet Take 6.25 mg by mouth 2 (two) times daily with a meal.   11/02/2016 at Unknown time  . clopidogrel (PLAVIX) 75 MG tablet Take 75 mg by mouth daily.    10-28-16  . levothyroxine (SYNTHROID, LEVOTHROID) 88 MCG tablet Take 88 mcg by mouth every evening.   0 11/02/2016 at Unknown time  . Lutein 6 MG CAPS Take 6 mg by mouth daily with breakfast.  Past Week at Unknown time  . metFORMIN (GLUCOPHAGE) 500 MG tablet Take 500 mg by mouth daily with breakfast.   Past Week at Unknown time  . simvastatin (ZOCOR) 20 MG tablet Take 20 mg by mouth every evening.    11/02/2016 at Unknown time    Inpatient Medications:  . amLODipine  10 mg Oral Q breakfast  . aspirin  81 mg Oral Daily  . benazepril  20 mg Oral Q breakfast  . carvedilol  6.25 mg Oral BID WC  . clopidogrel  75 mg Oral Daily  . insulin aspart  0-15 Units Subcutaneous TID WC    . levothyroxine  88 mcg Oral QAC breakfast  . pantoprazole  40 mg Oral Daily  . simvastatin  20 mg Oral QPM  . sodium chloride flush  3 mL Intravenous Q12H    Allergies: No Known Allergies  Social History   Social History  . Marital status: Married    Spouse name: N/A  . Number of children: 2  . Years of education: GED   Occupational History  . Not on file.   Social History Main Topics  . Smoking status: Former Smoker    Packs/day: 1.00    Years: 5.00    Types: Cigarettes    Quit date: 07/20/1973  . Smokeless tobacco: Never Used  . Alcohol use No  . Drug use: No  . Sexual activity: Not on file   Other Topics Concern  . Not on file   Social History Narrative  . No narrative on file     Family History  Problem Relation Age of Onset  . Lung disease Mother   . Colon cancer Father   . Diabetes Sister   . CAD Sister      Review of Systems: All other systems reviewed and are otherwise negative except as noted above.  Physical Exam: Vitals:   11/04/16 2141 11/05/16 0321 11/05/16 0500 11/05/16 0752  BP: (!) 131/51 (!) 141/54  127/61  Pulse: 90 77  75  Resp: 18 16    Temp: 99.4 F (37.4 C) 98.4 F (36.9 C)    TempSrc: Oral Oral    SpO2: 99% 98%  98%  Weight:   173 lb 1.6 oz (78.5 kg)   Height:        GEN- The patient is well appearing, alert and oriented x 3 today.   HEENT: normocephalic, atraumatic; sclera clear, conjunctiva pink; hearing intact; oropharynx clear; neck supple  Lungs- Clear to ausculation bilaterally, normal work of breathing.  No wheezes, rales, rhonchi Heart- Regular rate and rhythm  GI- soft, non-tender, non-distended, bowel sounds present  Extremities- no clubbing, cyanosis, or edema; DP/PT/radial pulses 2+ bilaterally MS- no significant deformity or atrophy Skin- warm and dry, no rash or lesion Psych- euthymic mood, full affect Neuro- strength and sensation are intact  Labs:   Lab Results  Component Value Date   WBC 6.0  11/04/2016   HGB 10.2 (L) 11/04/2016   HCT 30.4 (L) 11/04/2016   MCV 92.4 11/04/2016   PLT 114 (L) 11/04/2016    Recent Labs Lab 10/30/16 1330  11/04/16 0410  NA 137  < > 138  K 4.2  < > 4.0  CL 108  < > 108  CO2 20*  --  25  BUN 18  < > 9  CREATININE 1.23  < > 1.09  CALCIUM 8.9  --  8.2*  PROT 7.4  --   --   BILITOT 0.5  --   --  ALKPHOS 42  --   --   ALT 14*  --   --   AST 23  --   --   GLUCOSE 109*  < > 130*  < > = values in this interval not displayed.    Radiology/Studies: Dg Chest 2 View Result Date: 10/30/2016 CLINICAL DATA:  Preoperative examination. History of coronary artery disease with CABG, aortic stenosis, diabetes, former smoker. EXAM: CHEST  2 VIEW COMPARISON:  PA and lateral chest x-ray of September 22, 2016 FINDINGS: The lungs remain hyperinflated. The interstitial markings are mildly prominent though stable. There is no alveolar infiltrate or pleural effusion. The heart and pulmonary vascularity are normal. The patient has undergone previous CABG. There is calcification in the wall of the aortic arch. The bony structures are subjectively osteopenic. There is mild anterior wedging of T6 through T8 which is stable. There is also superior and inferior to endplate depression of T12 which is also stable. IMPRESSION: COPD.  No pneumonia nor CHF. Aortic atherosclerosis. Stable partial compression of mid and lower thoracic vertebral bodies. Electronically Signed   By: David  SwazilandJordan M.D.   On: 10/30/2016 14:19   EKG: sinus rhythm with LBBB  TELEMETRY: sinus rhythm, intermittent 2:1 heart block  Assessment/Plan: 1.  New LBBB post TAVR with intermittent 2:1 heart block The patient has new LBBB and intermittent 2:1 heart block post TAVR.  He is on low dose BB but has also had post op AF and SVT which require BB therapy. Options were discussed with the patient extensively today by Dr Ladona Ridgelaylor including discharge with 30 day monitor to look for progression of conduction system  disease versus pacemaker implantation. He currently has an indication for pacemaker with LBBB and intermittent 2:1 heart block.  The patient would like to proceed with pacemaker implantation.  Risks, benefits reviewed with the patient who wishes to proceed. Will plan for later today with Dr Ladona Ridgelaylor.  2.  Aortic stenosis s/p TAVR Management per Dr Excell Seltzerooper   Signed, Gypsy BalsamAmber Seiler, NP 11/05/2016 1:13 PM  EP Attending  Patient seen and examined. Agree with above. I have reviewed the findings as noted above. The patient has developed LBBB and intermittent 2:1 heart block after TAVR. Postop he also had some SVT. I have discussed the treatment options with the patient, Dr. Excell Seltzerooper, and his wife. The risks/benefits/goals/expectations of PPM insertion were reviewed and he wishes to proceed.   Leonia ReevesGregg Laurana Magistro,M.D.

## 2016-11-05 NOTE — H&P (View-Only) (Signed)
 ELECTROPHYSIOLOGY CONSULT NOTE    Patient ID: Thomas English MRN: [id number], DOB/AGE: 02/14/1932 83 y.o.  Admit date: 11/03/2016 Date of Consult: 11/05/2016  Primary Physician: Christopher Street, MD Primary Cardiologist: Berry Requesting MD: Cooper  Reason for Consultation: new left bundle branch block and heart block post TAVR  HPI:  Thomas English is a 83 y.o. male with a past medical history significant for diabetes, hypertension, hyperlipidemia, CAD s/p CABG and aortic stenosis s/p TAVR this admission. Post op, he has developed left bundle branch block with intermittent 2:1 block. EP has been asked to evaluate for treatment options.  He denies chest pain, shortness of breath, LE edema, recent fevers, chills, nausea or vomiting. He has not had dizziness, pre-syncope, or syncope.   Past Medical History:  Diagnosis Date  . Aortic stenosis   . Arthritis   . CAD (coronary artery disease)    a. CAD s/p CABG 2000   b. cath on 10/05/16 with severe native vessel CAD and patent bypass grafts  . Carotid stenosis    bilateral internal, left greater than right, stable  . Diabetes mellitus type 2, noninsulin dependent (HCC)   . Hyperlipidemia   . Hypertension   . Hypothyroidism      Surgical History:  Past Surgical History:  Procedure Laterality Date  . BACK SURGERY    . CARDIAC CATHETERIZATION  11/10/1999   3-vessel disease, recommendation for CABG  . CARDIAC CATHETERIZATION N/A 10/05/2016   Procedure: Right/Left Heart Cath and Coronary/Graft Angiography;  Surgeon: Jonathan J Berry, MD;  Location: [facility] INVASIVE CV LAB;  Service: Cardiovascular;  Laterality: N/A;  . CORONARY ARTERY BYPASS GRAFT  11/13/1999   LIMA to LAD and a vein graft to the first OM branch, second OM branch, and to the RCA (Dr. B. Bartle)  . EYE SURGERY     eye duct surgery  . NM MYOCAR PERF WALL MOTION  09/2013   normal lexiscan myoview  . PERIPHERAL VASCULAR CATHETERIZATION N/A 10/05/2016   Procedure:  Abdominal Aortogram;  Surgeon: Jonathan J Berry, MD;  Location: [facility] INVASIVE CV LAB;  Service: Cardiovascular;  Laterality: N/A;  . TEE WITHOUT CARDIOVERSION N/A 11/03/2016   Procedure: TRANSESOPHAGEAL ECHOCARDIOGRAM (TEE);  Surgeon: Michael Cooper, MD;  Location: [facility] OR;  Service: Open Heart Surgery;  Laterality: N/A;  . TRANSCATHETER AORTIC VALVE REPLACEMENT, TRANSFEMORAL N/A 11/03/2016   Procedure: TRANSCATHETER AORTIC VALVE REPLACEMENT, TRANSFEMORAL;  Surgeon: Michael Cooper, MD;  Location: [facility] OR;  Service: Open Heart Surgery;  Laterality: N/A;  . TRANSTHORACIC ECHOCARDIOGRAM  12/2013   EF 55-60%, grade 1 diastolic dysfunction, high ventricular filling pressure; severe AV stenosis; calcified MV annulus; LA & RA mildly dilated; atrial septal aneurysm     Prescriptions Prior to Admission  Medication Sig Dispense Refill Last Dose  . amLODipine (NORVASC) 10 MG tablet Take 10 mg by mouth daily with breakfast.   6 11/03/2016 at 0430  . benazepril (LOTENSIN) 20 MG tablet Take 20 mg by mouth daily with breakfast.   6 11/02/2016 at Unknown time  . carvedilol (COREG) 6.25 MG tablet Take 6.25 mg by mouth 2 (two) times daily with a meal.   11/02/2016 at Unknown time  . clopidogrel (PLAVIX) 75 MG tablet Take 75 mg by mouth daily.    10-28-16  . levothyroxine (SYNTHROID, LEVOTHROID) 88 MCG tablet Take 88 mcg by mouth every evening.   0 11/02/2016 at Unknown time  . Lutein 6 MG CAPS Take 6 mg by mouth daily with breakfast.      Past Week at Unknown time  . metFORMIN (GLUCOPHAGE) 500 MG tablet Take 500 mg by mouth daily with breakfast.   Past Week at Unknown time  . simvastatin (ZOCOR) 20 MG tablet Take 20 mg by mouth every evening.    11/02/2016 at Unknown time    Inpatient Medications:  . amLODipine  10 mg Oral Q breakfast  . aspirin  81 mg Oral Daily  . benazepril  20 mg Oral Q breakfast  . carvedilol  6.25 mg Oral BID WC  . clopidogrel  75 mg Oral Daily  . insulin aspart  0-15 Units Subcutaneous TID WC    . levothyroxine  88 mcg Oral QAC breakfast  . pantoprazole  40 mg Oral Daily  . simvastatin  20 mg Oral QPM  . sodium chloride flush  3 mL Intravenous Q12H    Allergies: No Known Allergies  Social History   Social History  . Marital status: Married    Spouse name: N/A  . Number of children: 2  . Years of education: GED   Occupational History  . Not on file.   Social History Main Topics  . Smoking status: Former Smoker    Packs/day: 1.00    Years: 5.00    Types: Cigarettes    Quit date: 07/20/1973  . Smokeless tobacco: Never Used  . Alcohol use No  . Drug use: No  . Sexual activity: Not on file   Other Topics Concern  . Not on file   Social History Narrative  . No narrative on file     Family History  Problem Relation Age of Onset  . Lung disease Mother   . Colon cancer Father   . Diabetes Sister   . CAD Sister      Review of Systems: All other systems reviewed and are otherwise negative except as noted above.  Physical Exam: Vitals:   11/04/16 2141 11/05/16 0321 11/05/16 0500 11/05/16 0752  BP: (!) 131/51 (!) 141/54  127/61  Pulse: 90 77  75  Resp: 18 16    Temp: 99.4 F (37.4 C) 98.4 F (36.9 C)    TempSrc: Oral Oral    SpO2: 99% 98%  98%  Weight:   173 lb 1.6 oz (78.5 kg)   Height:        GEN- The patient is well appearing, alert and oriented x 3 today.   HEENT: normocephalic, atraumatic; sclera clear, conjunctiva pink; hearing intact; oropharynx clear; neck supple  Lungs- Clear to ausculation bilaterally, normal work of breathing.  No wheezes, rales, rhonchi Heart- Regular rate and rhythm  GI- soft, non-tender, non-distended, bowel sounds present  Extremities- no clubbing, cyanosis, or edema; DP/PT/radial pulses 2+ bilaterally MS- no significant deformity or atrophy Skin- warm and dry, no rash or lesion Psych- euthymic mood, full affect Neuro- strength and sensation are intact  Labs:   Lab Results  Component Value Date   WBC 6.0  11/04/2016   HGB 10.2 (L) 11/04/2016   HCT 30.4 (L) 11/04/2016   MCV 92.4 11/04/2016   PLT 114 (L) 11/04/2016    Recent Labs Lab 10/30/16 1330  11/04/16 0410  NA 137  < > 138  K 4.2  < > 4.0  CL 108  < > 108  CO2 20*  --  25  BUN 18  < > 9  CREATININE 1.23  < > 1.09  CALCIUM 8.9  --  8.2*  PROT 7.4  --   --   BILITOT 0.5  --   --     ALKPHOS 42  --   --   ALT 14*  --   --   AST 23  --   --   GLUCOSE 109*  < > 130*  < > = values in this interval not displayed.    Radiology/Studies: Dg Chest 2 View Result Date: 10/30/2016 CLINICAL DATA:  Preoperative examination. History of coronary artery disease with CABG, aortic stenosis, diabetes, former smoker. EXAM: CHEST  2 VIEW COMPARISON:  PA and lateral chest x-ray of September 22, 2016 FINDINGS: The lungs remain hyperinflated. The interstitial markings are mildly prominent though stable. There is no alveolar infiltrate or pleural effusion. The heart and pulmonary vascularity are normal. The patient has undergone previous CABG. There is calcification in the wall of the aortic arch. The bony structures are subjectively osteopenic. There is mild anterior wedging of T6 through T8 which is stable. There is also superior and inferior to endplate depression of T12 which is also stable. IMPRESSION: COPD.  No pneumonia nor CHF. Aortic atherosclerosis. Stable partial compression of mid and lower thoracic vertebral bodies. Electronically Signed   By: David  Jordan M.D.   On: 10/30/2016 14:19   EKG: sinus rhythm with LBBB  TELEMETRY: sinus rhythm, intermittent 2:1 heart block  Assessment/Plan: 1.  New LBBB post TAVR with intermittent 2:1 heart block The patient has new LBBB and intermittent 2:1 heart block post TAVR.  He is on low dose BB but has also had post op AF and SVT which require BB therapy. Options were discussed with the patient extensively today by Dr Taylor including discharge with 30 day monitor to look for progression of conduction system  disease versus pacemaker implantation. He currently has an indication for pacemaker with LBBB and intermittent 2:1 heart block.  The patient would like to proceed with pacemaker implantation.  Risks, benefits reviewed with the patient who wishes to proceed. Will plan for later today with Dr Taylor.  2.  Aortic stenosis s/p TAVR Management per Dr Cooper   Signed, Amber Seiler, NP 11/05/2016 1:13 PM  EP Attending  Patient seen and examined. Agree with above. I have reviewed the findings as noted above. The patient has developed LBBB and intermittent 2:1 heart block after TAVR. Postop he also had some SVT. I have discussed the treatment options with the patient, Dr. Cooper, and his wife. The risks/benefits/goals/expectations of PPM insertion were reviewed and he wishes to proceed.   Gregg Taylor,M.D.     

## 2016-11-05 NOTE — Progress Notes (Signed)
CARDIAC REHAB PHASE I   PRE:  Rate/Rhythm: 80 SR  BP:  Sitting: 140/53        SaO2: 99 RA  MODE:  Ambulation: 500 ft   POST:  Rate/Rhythm: 100 SR  BP:  Sitting: 158/50         SaO2: 99 RA  Pt ambulated 500 ft on RA, hand held assist, mostly steady gait, tolerated well with no complaints. Encouraged additional ambulation x2 today with staff. Pt to chair after walk, call bell within reach. Will follow up-tomorrow.   [phone] Joylene GrapesEmily C Bryleigh Ottaway, RN, BSN 11/05/2016 10:34 AM

## 2016-11-06 ENCOUNTER — Encounter (HOSPITAL_COMMUNITY): Payer: Self-pay | Admitting: Internal Medicine

## 2016-11-06 ENCOUNTER — Inpatient Hospital Stay (HOSPITAL_COMMUNITY): Payer: Medicare Other

## 2016-11-06 DIAGNOSIS — I447 Left bundle-branch block, unspecified: Secondary | ICD-10-CM

## 2016-11-06 DIAGNOSIS — I459 Conduction disorder, unspecified: Secondary | ICD-10-CM | POA: Insufficient documentation

## 2016-11-06 DIAGNOSIS — D649 Anemia, unspecified: Secondary | ICD-10-CM

## 2016-11-06 DIAGNOSIS — D696 Thrombocytopenia, unspecified: Secondary | ICD-10-CM

## 2016-11-06 LAB — GLUCOSE, CAPILLARY
GLUCOSE-CAPILLARY: 97 mg/dL (ref 65–99)
GLUCOSE-CAPILLARY: 143 mg/dL — AB (ref 65–99)

## 2016-11-06 MED ORDER — SENNOSIDES-DOCUSATE SODIUM 8.6-50 MG PO TABS
1.0000 | ORAL_TABLET | Freq: Once | ORAL | Status: DC
  Filled 2016-11-06: qty 1

## 2016-11-06 MED ORDER — ASPIRIN 81 MG PO CHEW: 81.0000 mg | CHEWABLE_TABLET | Freq: Every day | ORAL | Status: DC

## 2016-11-06 MED FILL — Cefazolin Sodium-Dextrose IV Solution 2 GM/100ML-4%: INTRAVENOUS | Qty: 100 | Status: AC

## 2016-11-06 MED FILL — Gentamicin Sulfate Inj 40 MG/ML: INTRAMUSCULAR | Qty: 2 | Status: AC

## 2016-11-06 MED FILL — Sodium Chloride Irrigation Soln 0.9%: Qty: 500 | Status: AC

## 2016-11-06 NOTE — Discharge Instructions (Signed)
Supplemental Discharge Instructions for  Pacemaker Patients  Activity No heavy lifting or vigorous activity with your left/right arm for 6 to 8 weeks.  Do not raise your left/right arm above your head for one week.  Gradually raise your affected arm as drawn below.           __        11/10/16                  11/11/16                11/12/16               11/13/16  NO DRIVING for  4 weeks (due to TAVR)  WOUND CARE - Keep the wound area clean and dry.  Do not get this area wet for one week. No showers for one week; you may shower on  11/13/16   . - The tape/steri-strips on your wound will fall off; do not pull them off.  No bandage is needed on the site.  DO  NOT apply any creams, oils, or ointments to the wound area. - If you notice any drainage or discharge from the wound, any swelling or bruising at the site, or you develop a fever > 101? F after you are discharged home, call the office at once.  Special Instructions - You are still able to use cellular telephones; use the ear opposite the side where you have your pacemaker.  Avoid carrying your cellular phone near your device. - When traveling through airports, show security personnel your identification card to avoid being screened in the metal detectors.  Ask the security personnel to use the hand wand. - Avoid arc welding equipment, MRI testing (magnetic resonance imaging), TENS units (transcutaneous nerve stimulators).  Call the office for questions about other devices. - Avoid electrical appliances that are in poor condition or are not properly grounded. - Microwave ovens are safe to be near or to operate.   ACTIVITY AND EXERCISE  Daily activity and exercise are an important part of your recovery. People recover at different rates depending on their general health and type of valve procedure.  Most people require six to 10 weeks to feel recovered.  No lifting, pushing, pulling more than 10 pounds (examples to avoid:  groceries, vacuuming, gardening, golfing):             - For one week with a procedure through the groin.             - For six weeks for procedures through the chest wall.             - For three months for procedures through the breast-bone. NOTE: You will typically see one of our providers 7-10 days after your procedure to discuss WHEN TO RESUME the above activities.    DRIVING  Do not drive for four weeks after the date of your procedure.  If you have been told by your doctor in the past that you may not drive, you must talk with him/her before you begin driving again.  When you resume driving, you must have someone with you.   DRESSING  Groin site: you may leave the clear dressing over the site for up to one week or until it falls off.   HYGIENE  If you had a femoral (leg) procedure, you may not shower for 1 week because of your pacemaker. After the shower, pat the site dry. Do  NOT use powder, oils or lotions in your groin area until the site has completely healed.  If you notice any fevers, chills, increased pain, swelling, bleeding or pus, please contact your doctor.  ADDITIONAL INFORMATION  If you are going to have an upcoming dental procedure, please contact our office as you may require antibiotics ahead of time to prevent infection on your heart valve.

## 2016-11-06 NOTE — Progress Notes (Signed)
SUBJECTIVE: The patient is doing well today.  At this time, he denies chest pain, shortness of breath, or any new concerns.  CURRENT MEDICATIONS: . amLODipine  10 mg Oral Q breakfast  . aspirin  81 mg Oral Daily  . benazepril  20 mg Oral Q breakfast  . carvedilol  6.25 mg Oral BID WC  . insulin aspart  0-15 Units Subcutaneous TID WC  . levothyroxine  88 mcg Oral QAC breakfast  . pantoprazole  40 mg Oral Daily  . simvastatin  20 mg Oral QPM  . sodium chloride flush  3 mL Intravenous Q12H   . phenylephrine (NEO-SYNEPHRINE) Adult infusion Stopped (11/04/16 0200)    OBJECTIVE: Physical Exam: Vitals:   11/05/16 1802 11/05/16 1815 11/05/16 2130 11/06/16 0430  BP: (!) 141/51 (!) 142/56 (!) 105/41 (!) 139/59  Pulse: 78 80 68 80  Resp: 14 16 18 18   Temp:   97.8 F (36.6 C) 98 F (36.7 C)  TempSrc:   Oral Oral  SpO2: 100% 99% 99% 100%  Weight:    171 lb 14.4 oz (78 kg)  Height:        Intake/Output Summary (Last 24 hours) at 11/06/16 [id number] Last data filed at 11/06/16 0454  Gross per 24 hour  Intake              723 ml  Output              850 ml  Net             -127 ml    Telemetry reveals sinus rhythm with intermittent atrial pacing   GEN- The patient is well appearing, alert and oriented x 3 today.   Head- normocephalic, atraumatic Eyes-  Sclera clear, conjunctiva pink Ears- hearing intact Oropharynx- clear Neck- supple  Lungs- Clear to ausculation bilaterally, normal work of breathing Heart- Regular rate and rhythm  GI- soft, NT, ND, + BS Extremities- no clubbing, cyanosis, or edema Skin- no rash or lesion, left chest without hematoma, +small amount of ecchymosis  Psych- euthymic mood, full affect Neuro- strength and sensation are intact  LABS: Basic Metabolic Panel:  Recent Labs  11/91/4711/14/17 1349 11/04/16 0410  NA 139 138  K 4.6 4.0  CL 104 108  CO2  --  25  GLUCOSE 160* 130*  BUN 19 9  CREATININE 1.10 1.09  CALCIUM  --  8.2*  MG  --  2.1    CBC:  Recent Labs  11/03/16 1030  11/03/16 1349 11/04/16 0410  WBC 5.9  --   --  6.0  HGB 10.7*  < > 9.5* 10.2*  HCT 32.0*  < > 28.0* 30.4*  MCV 91.2  --   --  92.4  PLT 126*  --   --  114*  < > = values in this interval not displayed. Cardiac Enzymes:  ASSESSMENT AND PLAN:  Active Problems:   Severe aortic stenosis  1.  New LBBB with intermittent 2:1 heart block S/p PPM implant 11/05/16 CXR pending this morning Device information reviewed and normal Routine wound care and follow up  2. Aortic stenosis s/p TAVR Management per Dr Delorse Limberooper  Ok to discharge from EP standpoint once CXR done. Appts and instructions entered in AVS  Gypsy BalsamAmber Seiler, NP 11/06/2016 7:24 AM  EP Attending  Patient seen and examined. Agree with above. He is s/p PPM and doing well. CXR is pending. Ok for DC home from our perspective. We will arrange followup.  Sharlot GowdaGregg  Haeley Fordham,M.D.

## 2016-11-06 NOTE — Progress Notes (Signed)
    Subjective:  Patient feels well. Minimal soreness at his pacemaker site. No chest pain or shortness of breath.  Objective:  Vital Signs in the last 24 hours: Temp:  [97.6 F (36.4 C)-98 F (36.7 C)] 98 F (36.7 C) (11/17 0430) Pulse Rate:  [0-154] 80 (11/17 0430) Resp:  [0-19] 18 (11/17 0430) BP: (105-175)/(41-79) 139/59 (11/17 0430) SpO2:  [0 %-100 %] 100 % (11/17 0430) Weight:  [171 lb 14.4 oz (78 kg)] 171 lb 14.4 oz (78 kg) (11/17 0430)  Intake/Output from previous day: 11/16 0701 - 11/17 0700 In: 723 [P.O.:720; I.V.:3] Out: 850 [Urine:850]  Physical Exam: Pt is alert and oriented, NAD HEENT: normal Neck: JVP - normal, carotids 2+= without bruits Lungs: CTA bilaterally Chest: Pacemaker incision site with Steri-Strips, no erythema CV: RRR without murmur or gallop Abd: soft, NT, Positive BS, no hepatomegaly Ext: no C/C/E, distal pulses intact and equal Skin: warm/dry no rash   Lab Results:  Recent Labs  11/03/16 1030  11/03/16 1349 11/04/16 0410  WBC 5.9  --   --  6.0  HGB 10.7*  < > 9.5* 10.2*  PLT 126*  --   --  114*  < > = values in this interval not displayed.  Recent Labs  11/03/16 1349 11/04/16 0410  NA 139 138  K 4.6 4.0  CL 104 108  CO2  --  25  GLUCOSE 160* 130*  BUN 19 9  CREATININE 1.10 1.09   No results for input(s): TROPONINI in the last 72 hours.  Invalid input(s): CK, MB  Cardiac Studies: EKG: sinus rhythm with first degree AV block HR 82 bpm  Tele: Personally reviewed: Normal sinus rhythm, intermittent pacing  Assessment/Plan:  1. Severe symptomatic aortic stenosis, POD #3 from TAVR - normal valve function by echo 2. CAD, native vessel, without angina (remote CABG with patent grafts) 3. Type II DM - CBG's in range with sliding scale insulin - plan resume metformin at discharge 4. HTN - controlled 5. Anemia, mild 6. New LBBB - high-grade AV block AV block - now s/p PPM placement  Disposition: Appears stable for discharge.  Appreciate EP team care. Await chest x-ray results. Continue current medications which include dual antiplatelet therapy with aspirin and Plavix. Resume metformin at discharge.  Tonny Bollmanooper, Guilherme Schwenke, M.D. 11/06/2016, 6:50 AM

## 2016-11-06 NOTE — Progress Notes (Signed)
CARDIAC REHAB PHASE I   PRE:  Rate/Rhythm: 82 SR    BP: sitting     SaO2: 99 RA  MODE:  Ambulation: 500 ft   POST:  Rate/Rhythm: 103 ST    BP: sitting 142/61     SaO2: 99 RA  Pt tolerated well. Steady on feet. Understands arm restrictions. Discussed walking gl at home and [facility]. [facility] however pt is not sure if he will do program right now.  [id number]   Harriet MassonRandi Kristan Graylee Arutyunyan CES, ACSM 11/06/2016 10:17 AM

## 2016-11-06 NOTE — Progress Notes (Signed)
Patient discharged to home with wife per MD order. Iv removed from [facility], telemetry d/c, CCMD notified. AVS provided to patient, went over follow up appointments and medications. Explained importance of following protocol for left arm restriction and movement plan.  Minerva Endsiffany N Kasmira Cacioppo RN

## 2016-11-06 NOTE — Discharge Summary (Signed)
Discharge Summary    Patient ID: Thomas English,  MRN: [id number], DOB/AGE: 80/03/1932 80 y.o.  Admit date: 11/03/2016 Discharge date: 11/06/2016  Primary Care Provider: Maryjean [address] Primary Cardiologist: Dr. Allyson SabalBerry / Dr. Excell Seltzerooper (TAVR)/ Dr. Ladona Ridgelaylor (EP)   Discharge Diagnoses    Principal Problem:   Severe aortic stenosis- s/p TAVR on this admission  Active Problems:   Heart block s/p Biotronic PPM 10/2016   Essential hypertension   Hyperlipidemia   Type 2 diabetes mellitus (HCC)   Coronary artery disease   Mild anemia   LBBB (left bundle branch block)   Thrombocytopenia (HCC)   Allergies No Known Allergies   History of Present Illness     Thomas English is a 82 y.o. male with a history of CAD s/p CABG, HTN, HLD, DMT2, and severe AS who [facility]16 for planned TAVR.  The patient's cardiac history dates back to 2000 when he presented with symptomatic coronary artery disease.  He underwent coronary artery bypass grafting by Dr. Laneta SimmersBartle at that time and recovered uneventfully. He has been followed ever since by Dr. Allyson SabalBerry in overall he has done remarkably well from a cardiac standpoint. He was noted to have a murmur on physical exam in over the years he has developed aortic stenosis that has progressed in severity on serial follow-up echocardiograms. He has remained active physically and asymptomatic until recently. Over the past year the patient reports development of symptoms of exertional shortness of breath and fatigue. Follow-up echocardiogram performed 10/02/2016 revealed normal left ventricular systolic function with peak velocity across the aortic valve measured greater than 5 m/s corresponded to mean transvalvular gradient estimated 59 mmHg.  The patient underwent diagnostic cardiac catheterization on 10/05/2016 that revealed severe native coronary artery disease but continued patency of bypass grafts to all major coronary artery distributions.  The  patient was referred for surgical consultation and evaluated by Dr. Laneta SimmersBartle on 10/14/2016. He has undergone CT angiography to further characterize the feasibility of transcatheter aortic valve replacement as an alternative to high risk conventional surgery.  She saw [name] for surgical consult on 10/29/16 and agreed that TAVR was appropriate. He was set up for the procedure on 11/03/16.   Hospital Course     Consultants: EP  1. Severe symptomatic aortic stenosis: POD #423from TAVR via transfemoral approach on 11/03/16. Normal valve function by echo. Continue ASA and plavix for at least 6 months.   2. CAD, native vessel: without angina (remote CABG with patent grafts)  3. Type II DM: CBG's in range with sliding scale insulin - plan resume metformin at discharge  4. HTN: controlled  5. Anemia/thrombocytopenia: mild. H/H 10.2/30.4, PLT 114 at discharge  6. New LBBB - high-grade AV block AV block: the patient developed a new LBBB and high grade AV block after TAVR and is now s/p PPM placement (Biotronik dual-chamber pacemaker: serial number [id number]). CXR today clear. Wound check and follow up with Dr Ladona Ridgelaylor arranged   The patient has had an uncomplicated hospital course and is recovering well. The femoral catheter site is stable. He has been seen by Dr. Excell Seltzerooper today and deemed ready for discharge home. All follow-up appointments have been scheduled. Discharge medications are listed below.  _____________  Discharge Vitals Blood pressure (!) 159/56, pulse 81, temperature 98 F (36.7 C), temperature source Oral, resp. rate 18, height 5\' 11"  (1.803 m), weight 171 lb 14.4 oz (78 kg), SpO2 100 %.  Filed Weights   11/03/16  4098 11/05/16 0500 11/06/16 0430  Weight: 173 lb (78.5 kg) 173 lb 1.6 oz (78.5 kg) 171 lb 14.4 oz (78 kg)    Labs & Radiologic Studies     CBC  Recent Labs  11/03/16 1349 11/04/16 0410  WBC  --  6.0  HGB 9.5* 10.2*  HCT 28.0* 30.4*  MCV  --  92.4  PLT  --  114*    Basic Metabolic Panel  Recent Labs  11/03/16 1349 11/04/16 0410  NA 139 138  K 4.6 4.0  CL 104 108  CO2  --  25  GLUCOSE 160* 130*  BUN 19 9  CREATININE 1.10 1.09  CALCIUM  --  8.2*  MG  --  2.1    Dg Chest 2 View  Result Date: 11/06/2016 CLINICAL DATA:  Pacemaker and heart valve placement. EXAM: CHEST  2 VIEW COMPARISON:  11/04/2016 FINDINGS: Dual lead pacemaker is in place from a left subclavian approach. Leads are in the region of the right atrium and right ventricle. No pneumothorax. Right internal jugular venous access sheath removed. Previous T AVR. Venous hypertension is resolving. Lungs are clear. No effusions. IMPRESSION: Good appearance following pacemaker placement.  Resolving edema. Electronically Signed   By: Paulina Fusi M.D.   On: 11/06/2016 09:58   Dg Chest 2 View  Result Date: 10/30/2016 CLINICAL DATA:  Preoperative examination. History of coronary artery disease with CABG, aortic stenosis, diabetes, former smoker. EXAM: CHEST  2 VIEW COMPARISON:  PA and lateral chest x-ray of September 22, 2016 FINDINGS: The lungs remain hyperinflated. The interstitial markings are mildly prominent though stable. There is no alveolar infiltrate or pleural effusion. The heart and pulmonary vascularity are normal. The patient has undergone previous CABG. There is calcification in the wall of the aortic arch. The bony structures are subjectively osteopenic. There is mild anterior wedging of T6 through T8 which is stable. There is also superior and inferior to endplate depression of T12 which is also stable. IMPRESSION: COPD.  No pneumonia nor CHF. Aortic atherosclerosis. Stable partial compression of mid and lower thoracic vertebral bodies. Electronically Signed   By: David  [place] M.D.   On: 10/30/2016 14:19   Ct Coronary Morph W/cta Cor W/score W/ca W/cm &/or Wo/cm  Addendum Date: 10/13/2016   ADDENDUM REPORT: 10/13/2016 19:44 CLINICAL DATA:  81 year old male with severe aortic  stenosis. EXAM: Cardiac TAVR CT TECHNIQUE: The patient was scanned on a Philips 256 scanner. A 120 kV retrospective scan was triggered in the descending thoracic aorta at 111 HU's. Gantry rotation speed was 270 msecs and collimation was .9 mm. No beta blockade or nitro were given. The 3D data set was reconstructed in 5% intervals of the R-R cycle. Systolic and diastolic phases were analyzed on a dedicated work station using MPR, MIP and VRT modes. The patient received 80 cc of contrast. FINDINGS: Aortic Valve: Trileaflet, severely thickened and calcified aortic valve with mild calcifications extending into the LVOT predominantly under the non-coronary cusp. Aorta:  Normal size, no dissection, moderate diffuse calcifications. Sinotubular Junction:  27 x 27 mm Ascending Thoracic Aorta:  32 X 30 mm Aortic Arch:  26 x 24 mm Descending Thoracic Aorta:  23 x 22 mm Sinus of Valsalva Measurements: Non-coronary:  35 mm Right -coronary:  30 mm Left -coronary:  33 mm Coronary Artery Height above Annulus: Left Main:  12 mm Right Coronary:  17 mm Virtual Basal Annulus Measurements: Maximum/Minimum Diameter:  28 x 22 mm Perimeter:  77 mm  Area:  445 mm2 Optimum Fluoroscopic Angle for Delivery:  RAO 1 CRA 1 Normal pulmonary vein drainage into the left atrium. No thrombus in the left atrial appendage. IMPRESSION: 1. Trileaflet, severely thickened and calcified aortic valve with mild calcifications extending into the LVOT predominantly under the non-coronary cusp. Annular measurements suitable for delivery of a 26 mm Edwards-SAPIEN 3 TAVR valve. 2. Sufficient annulus to coronary distance. 3. Optimum Fluoroscopic Angle for Delivery:  RAO 1 CRA 1 Tobias Alexander Electronically Signed   By: Tobias Alexander   On: 10/13/2016 19:44   Result Date: 10/13/2016 EXAM: OVER-READ INTERPRETATION  CT CHEST The following report is an over-read performed by radiologist Dr. Royal Piedra [facility] on 10/13/2016. This over-read  does not include interpretation of cardiac or coronary anatomy or pathology. The coronary calcium score/coronary CTA interpretation by the cardiologist is attached. COMPARISON:  None. FINDINGS: English description of extracardiac findings is under separate dictation for contemporaneously obtained CTA of the chest, abdomen and pelvis 10/13/2016. IMPRESSION: Please see separate dictation for contemporaneously obtained CTA of the chest, abdomen and pelvis 10/13/2016 for English description of extracardiac findings. Electronically Signed: By: Trudie Reed M.D. On: 10/13/2016 12:16   Dg Chest Port 1 View  Result Date: 11/04/2016 CLINICAL DATA:  Sore chest after aortic valve replacement EXAM: PORTABLE CHEST 1 VIEW COMPARISON:  Yesterday FINDINGS: Right IJ sheath in unchanged position. Stable heart size after aortic valve replacement. Pre-existing CABG. Hyperinflation and pulmonary venous congestion. No effusion or pneumothorax. IMPRESSION: 1. Stable from yesterday. 2. COPD and pulmonary vascular congestion. Electronically Signed   By: Marnee Spring M.D.   On: 11/04/2016 08:30   Dg Chest Port 1 View  Result Date: 11/03/2016 CLINICAL DATA:  Post aortic valve replacement. EXAM: PORTABLE CHEST 1 VIEW COMPARISON:  10/30/2016 FINDINGS: Prior median sternotomy, CABG and valve replacement. Mild hyperinflation of the lungs. Heart is borderline in size. Mild interstitial prominence throughout the lungs which could reflect interstitial edema. No pneumothorax or effusion. IMPRESSION: Increasing interstitial prominence throughout the lungs which could reflect interstitial edema. Electronically Signed   By: Charlett Nose M.D.   On: 11/03/2016 10:35   Ct Angio Chest Aorta W/cm &/or Wo/cm  Result Date: 10/13/2016 CLINICAL DATA:  82 year old male with history of severe aortic stenosis. Shortness of breath on exertion for some time. Preprocedural study prior to potential transcatheter aortic valve replacement (TAVR)  procedure. EXAM: CT ANGIOGRAPHY CHEST, ABDOMEN AND PELVIS TECHNIQUE: Multidetector CT imaging through the chest, abdomen and pelvis was performed using the standard protocol during bolus administration of intravenous contrast. Multiplanar reconstructed images and MIPs were obtained and reviewed to evaluate the vascular anatomy. CONTRAST:  70 mL of Isovue 370. COMPARISON:  None. FINDINGS: CTA CHEST FINDINGS Cardiovascular: Heart size is mildly enlarged. There is no significant pericardial fluid, thickening or pericardial calcification. There is aortic atherosclerosis, as well as atherosclerosis of the great vessels of the mediastinum and the coronary arteries, including calcified atherosclerotic plaque in the left main, left anterior descending, left circumflex and right coronary arteries. Status post median sternotomy for CABG, including LIMA to the LAD. Severe thickening calcification of the aortic valve. Mediastinum/Lymph Nodes: No pathologically enlarged mediastinal or hilar lymph nodes. Esophagus is unremarkable in appearance. No axillary lymphadenopathy. Lungs/Pleura: A few scattered tiny pulmonary nodules are noted in the lungs bilaterally, largest of which measures 5 mm in the left lower lobe (image 54 of series 407). No other larger more suspicious appearing pulmonary nodules or masses are noted. No acute consolidative  airspace disease. No pleural effusions. Mild diffuse bronchial wall thickening with very mild centrilobular and paraseptal emphysema. Musculoskeletal/Soft Tissues: There are no aggressive appearing lytic or blastic lesions noted in the visualized portions of the skeleton. Median sternotomy wires. CTA ABDOMEN AND PELVIS FINDINGS Hepatobiliary: No cystic or solid hepatic lesions. No intra or extrahepatic biliary ductal dilatation. Gallbladder is normal in appearance. Pancreas: No pancreatic mass. No pancreatic ductal dilatation. No pancreatic or peripancreatic fluid or inflammatory changes.  Spleen: Unremarkable. Adrenals/Urinary Tract: Bilateral adrenal glands and bilateral kidneys are normal in appearance. There is no hydroureteronephrosis or perinephric stranding to indicate urinary tract obstruction at this time. Urinary bladder is nearly decompressed, but otherwise unremarkable in appearance. Stomach/Bowel: The appearance of the stomach is normal. There is no pathologic dilatation of small bowel or colon. Normal appendix. Vascular/Lymphatic: Aortic atherosclerosis, with vascular findings and measurements pertinent to potential TAVR procedure, as detailed below. No aneurysm or dissection identified in the abdominal or pelvic vasculature. Celiac axis, superior mesenteric artery and inferior mesenteric artery are all widely patent without definite hemodynamically significant stenosis. Single renal arteries bilaterally are both widely patent. No lymphadenopathy noted in the abdomen or pelvis. Reproductive: Prostate gland and seminal vesicles are unremarkable in appearance. Other: No significant volume of ascites.  No pneumoperitoneum. Musculoskeletal: There are no aggressive appearing lytic or blastic lesions noted in the visualized portions of the skeleton. VASCULAR MEASUREMENTS PERTINENT TO TAVR: AORTA: Minimal Aortic Diameter -  8 x 11 mm Severity of Aortic Calcification -  severe RIGHT PELVIS: Right Common Iliac Artery - Minimal Diameter - 6.5 x 2.6 mm Tortuosity - mild Calcification - severe Right External Iliac Artery - Minimal Diameter - 7.0 x 6.4 mm Tortuosity - mild to moderate Calcification - minimal Right Common Femoral Artery - Minimal Diameter - 7.4 x 6.1 mm Tortuosity - mild Calcification - mild LEFT PELVIS: Left Common Iliac Artery - Minimal Diameter - 3.3 x 7.0 mm Tortuosity - mild Calcification - severe Left External Iliac Artery - Minimal Diameter - 6.7 x 7.0 mm Tortuosity - mild Calcification - minimal Left Common Femoral Artery - Minimal Diameter - 6.7 x 4.7 mm Tortuosity - mild  Calcification - mild Review of the MIP images confirms the above findings. IMPRESSION: 1. Vascular findings and measurements pertinent to potential TAVR procedure, as detailed above. Secondary to severe calcification and small lumen and diameter in the common iliac arteries bilaterally, this patient does not appear to have suitable pelvic arterial access. 2. Severe thickening and calcification of the aortic valve, compatible with the reported clinical history of severe aortic stenosis. 3. Small pulmonary nodules scattered throughout the lungs bilaterally measuring 5 mm or less in size. Noncontrast chest CT can be considered in 12 months in this high risk patient. This recommendation follows the consensus statement: Guidelines for Management of Incidental Pulmonary Nodules Detected on CT Images: From the Fleischner Society 2017; Radiology 2017; 284:228-243. 4. Aortic atherosclerosis, in addition to left main and 3 vessel coronary artery disease. Status post median sternotomy for CABG, including LIMA to the LAD. 5. Additional incidental findings, as above. Electronically Signed   By: Trudie Reed M.D.   On: 10/13/2016 13:41   Ct Angio Abd/pel W/ And/or W/o  Result Date: 10/13/2016 CLINICAL DATA:  82 year old male with history of severe aortic stenosis. Shortness of breath on exertion for some time. Preprocedural study prior to potential transcatheter aortic valve replacement (TAVR) procedure. EXAM: CT ANGIOGRAPHY CHEST, ABDOMEN AND PELVIS TECHNIQUE: Multidetector CT imaging through the chest, abdomen and  pelvis was performed using the standard protocol during bolus administration of intravenous contrast. Multiplanar reconstructed images and MIPs were obtained and reviewed to evaluate the vascular anatomy. CONTRAST:  70 mL of Isovue 370. COMPARISON:  None. FINDINGS: CTA CHEST FINDINGS Cardiovascular: Heart size is mildly enlarged. There is no significant pericardial fluid, thickening or pericardial  calcification. There is aortic atherosclerosis, as well as atherosclerosis of the great vessels of the mediastinum and the coronary arteries, including calcified atherosclerotic plaque in the left main, left anterior descending, left circumflex and right coronary arteries. Status post median sternotomy for CABG, including LIMA to the LAD. Severe thickening calcification of the aortic valve. Mediastinum/Lymph Nodes: No pathologically enlarged mediastinal or hilar lymph nodes. Esophagus is unremarkable in appearance. No axillary lymphadenopathy. Lungs/Pleura: A few scattered tiny pulmonary nodules are noted in the lungs bilaterally, largest of which measures 5 mm in the left lower lobe (image 54 of series 407). No other larger more suspicious appearing pulmonary nodules or masses are noted. No acute consolidative airspace disease. No pleural effusions. Mild diffuse bronchial wall thickening with very mild centrilobular and paraseptal emphysema. Musculoskeletal/Soft Tissues: There are no aggressive appearing lytic or blastic lesions noted in the visualized portions of the skeleton. Median sternotomy wires. CTA ABDOMEN AND PELVIS FINDINGS Hepatobiliary: No cystic or solid hepatic lesions. No intra or extrahepatic biliary ductal dilatation. Gallbladder is normal in appearance. Pancreas: No pancreatic mass. No pancreatic ductal dilatation. No pancreatic or peripancreatic fluid or inflammatory changes. Spleen: Unremarkable. Adrenals/Urinary Tract: Bilateral adrenal glands and bilateral kidneys are normal in appearance. There is no hydroureteronephrosis or perinephric stranding to indicate urinary tract obstruction at this time. Urinary bladder is nearly decompressed, but otherwise unremarkable in appearance. Stomach/Bowel: The appearance of the stomach is normal. There is no pathologic dilatation of small bowel or colon. Normal appendix. Vascular/Lymphatic: Aortic atherosclerosis, with vascular findings and measurements  pertinent to potential TAVR procedure, as detailed below. No aneurysm or dissection identified in the abdominal or pelvic vasculature. Celiac axis, superior mesenteric artery and inferior mesenteric artery are all widely patent without definite hemodynamically significant stenosis. Single renal arteries bilaterally are both widely patent. No lymphadenopathy noted in the abdomen or pelvis. Reproductive: Prostate gland and seminal vesicles are unremarkable in appearance. Other: No significant volume of ascites.  No pneumoperitoneum. Musculoskeletal: There are no aggressive appearing lytic or blastic lesions noted in the visualized portions of the skeleton. VASCULAR MEASUREMENTS PERTINENT TO TAVR: AORTA: Minimal Aortic Diameter -  8 x 11 mm Severity of Aortic Calcification -  severe RIGHT PELVIS: Right Common Iliac Artery - Minimal Diameter - 6.5 x 2.6 mm Tortuosity - mild Calcification - severe Right External Iliac Artery - Minimal Diameter - 7.0 x 6.4 mm Tortuosity - mild to moderate Calcification - minimal Right Common Femoral Artery - Minimal Diameter - 7.4 x 6.1 mm Tortuosity - mild Calcification - mild LEFT PELVIS: Left Common Iliac Artery - Minimal Diameter - 3.3 x 7.0 mm Tortuosity - mild Calcification - severe Left External Iliac Artery - Minimal Diameter - 6.7 x 7.0 mm Tortuosity - mild Calcification - minimal Left Common Femoral Artery - Minimal Diameter - 6.7 x 4.7 mm Tortuosity - mild Calcification - mild Review of the MIP images confirms the above findings. IMPRESSION: 1. Vascular findings and measurements pertinent to potential TAVR procedure, as detailed above. Secondary to severe calcification and small lumen and diameter in the common iliac arteries bilaterally, this patient does not appear to have suitable pelvic arterial access. 2. Severe thickening and  calcification of the aortic valve, compatible with the reported clinical history of severe aortic stenosis. 3. Small pulmonary nodules scattered  throughout the lungs bilaterally measuring 5 mm or less in size. Noncontrast chest CT can be considered in 12 months in this high risk patient. This recommendation follows the consensus statement: Guidelines for Management of Incidental Pulmonary Nodules Detected on CT Images: From the Fleischner Society 2017; Radiology 2017; 284:228-243. 4. Aortic atherosclerosis, in addition to left main and 3 vessel coronary artery disease. Status post median sternotomy for CABG, including LIMA to the LAD. 5. Additional incidental findings, as above. Electronically Signed   By: Trudie Reed M.D.   On: 10/13/2016 13:41     Diagnostic Studies/Procedures    MULTIDISCIPLINARY HEART VALVE TEAM  TAVR OPERATIVE NOTE   Date of Procedure:                11/03/2016  Preoperative Diagnosis:      Severe Aortic Stenosis   Postoperative Diagnosis:    Same   Procedure:       Transcatheter Aortic Valve Replacement - Percutaneous Transfemoral Approach             Edwards Sapien 3 THV (size 26 mm, model # 9600TFX, serial # [id number])              Co-Surgeons:                        Evelene Croon, MD and Tonny Bollman, MD  Anesthesiologist:                  Jairo Ben, MD  Echocardiographer:              Tobias Alexander, MD  Pre-operative Echo Findings: ? Severe aortic stenosis ? Normal left ventricular systolic function  Post-operative Echo Findings: ? No paravalvular leak ? Normal left ventricular systolic function _____________   2D ECHO: 11/04/2016 LV EF: 55% -   60% Study Conclusions - Left ventricle: The cavity size was normal. There was mild   concentric hypertrophy. Systolic function was normal. The   estimated ejection fraction was in the range of 55% to 60%. Wall   motion was normal; there were no regional wall motion   abnormalities. Features are consistent with a pseudonormal left   ventricular filling pattern, with concomitant abnormal relaxation   and increased filling  pressure (grade 2 diastolic dysfunction). - Aortic valve: A 26mm Edwards Sapien 3 TAVR bioprosthesis was   present and well-seated. There was no regurgitation. Peak   velocity (S): 195 cm/s. Mean gradient (S): 8 mm Hg. Valve area   (VTI): 2.45 cm^2. Valve area (Vmax): 2.32 cm^2. Valve area   (Vmean): 2.46 cm^2. - Mitral valve: Calcified annulus. Transvalvular velocity was   within the normal range. There was no evidence for stenosis.   There was mild regurgitation. - Right ventricle: The cavity size was normal. Wall thickness was   normal. Systolic function was normal. - Tricuspid valve: There was mild regurgitation. - Pulmonary arteries: Systolic pressure was within the normal   range. PA peak pressure: 24 mm Hg (S).  _____________  CXR 11/06/16 IMPRESSION: Good appearance following pacemaker placement.  Resolving edema.  Disposition   Pt is being discharged home today in good condition.  Follow-up Plans & Appointments    Follow-up Information    [facility] [address] [facility] Follow up on 11/20/2016.   Specialty:  Cardiology Why:  at 11AM for wound check  Contact information: [address], [address] [place] [id number] [phone]       Lewayne BuntingGregg Taylor, MD Follow up on 02/05/2017.   Specialty:  Cardiology Why:  at 8:30AM Contact information: [address] [place] [address] [phone]        Tonny Bollmanooper, Michael, MD Follow up on 12/10/2016.   Specialty:  Cardiology Why:  On 12/10/16, you will have heart ultrasound at 9:30am then follow-up with Dr. Excell Seltzerooper at 10:30am. Contact information: [address] [place] [address] [phone]        Jacolyn ReedyMichele Lenze, PA-C Follow up on 11/16/2016.   Specialty:  Cardiology Why:  @ 2:15pm. Please arrive at least 10 minutes early. Contact information: [address] [place] [address] [phone]          Discharge Instructions    Amb  Referral to Cardiac Rehabilitation    Complete by:  As directed    Diagnosis:  Valve Replacement   Valve:  Aortic      Discharge Medications     Medication List    TAKE these medications   amLODipine 10 MG tablet Commonly known as:  NORVASC Take 10 mg by mouth daily with breakfast.   aspirin 81 MG chewable tablet Chew 1 tablet (81 mg total) by mouth daily. Start taking on:  11/07/2016   benazepril 20 MG tablet Commonly known as:  LOTENSIN Take 20 mg by mouth daily with breakfast.   carvedilol 6.25 MG tablet Commonly known as:  COREG Take 6.25 mg by mouth 2 (two) times daily with a meal.   clopidogrel 75 MG tablet Commonly known as:  PLAVIX Take 75 mg by mouth daily.   levothyroxine 88 MCG tablet Commonly known as:  SYNTHROID, LEVOTHROID Take 88 mcg by mouth every evening.   Lutein 6 MG Caps Take 6 mg by mouth daily with breakfast.   metFORMIN 500 MG tablet Commonly known as:  GLUCOPHAGE Take 500 mg by mouth daily with breakfast.   simvastatin 20 MG tablet Commonly known as:  ZOCOR Take 20 mg by mouth every evening.          Outstanding Labs/Studies  Echo in 1 month  Duration of Discharge Encounter   Greater than 30 minutes including physician time.  Signed, Cline CrockKathryn Shaden Higley PA-C 11/06/2016, 12:24 PM

## 2016-11-10 IMAGING — CR DG CHEST 2V
2 series · 2 of 2 positions shown · non-contrast
Comparison: Report of a chest x-ray November 14, 1999.

CLINICAL DATA: Preoperative examination prior to heart
catheterization.

EXAM:
CHEST  2 VIEW

[w chest lat *]
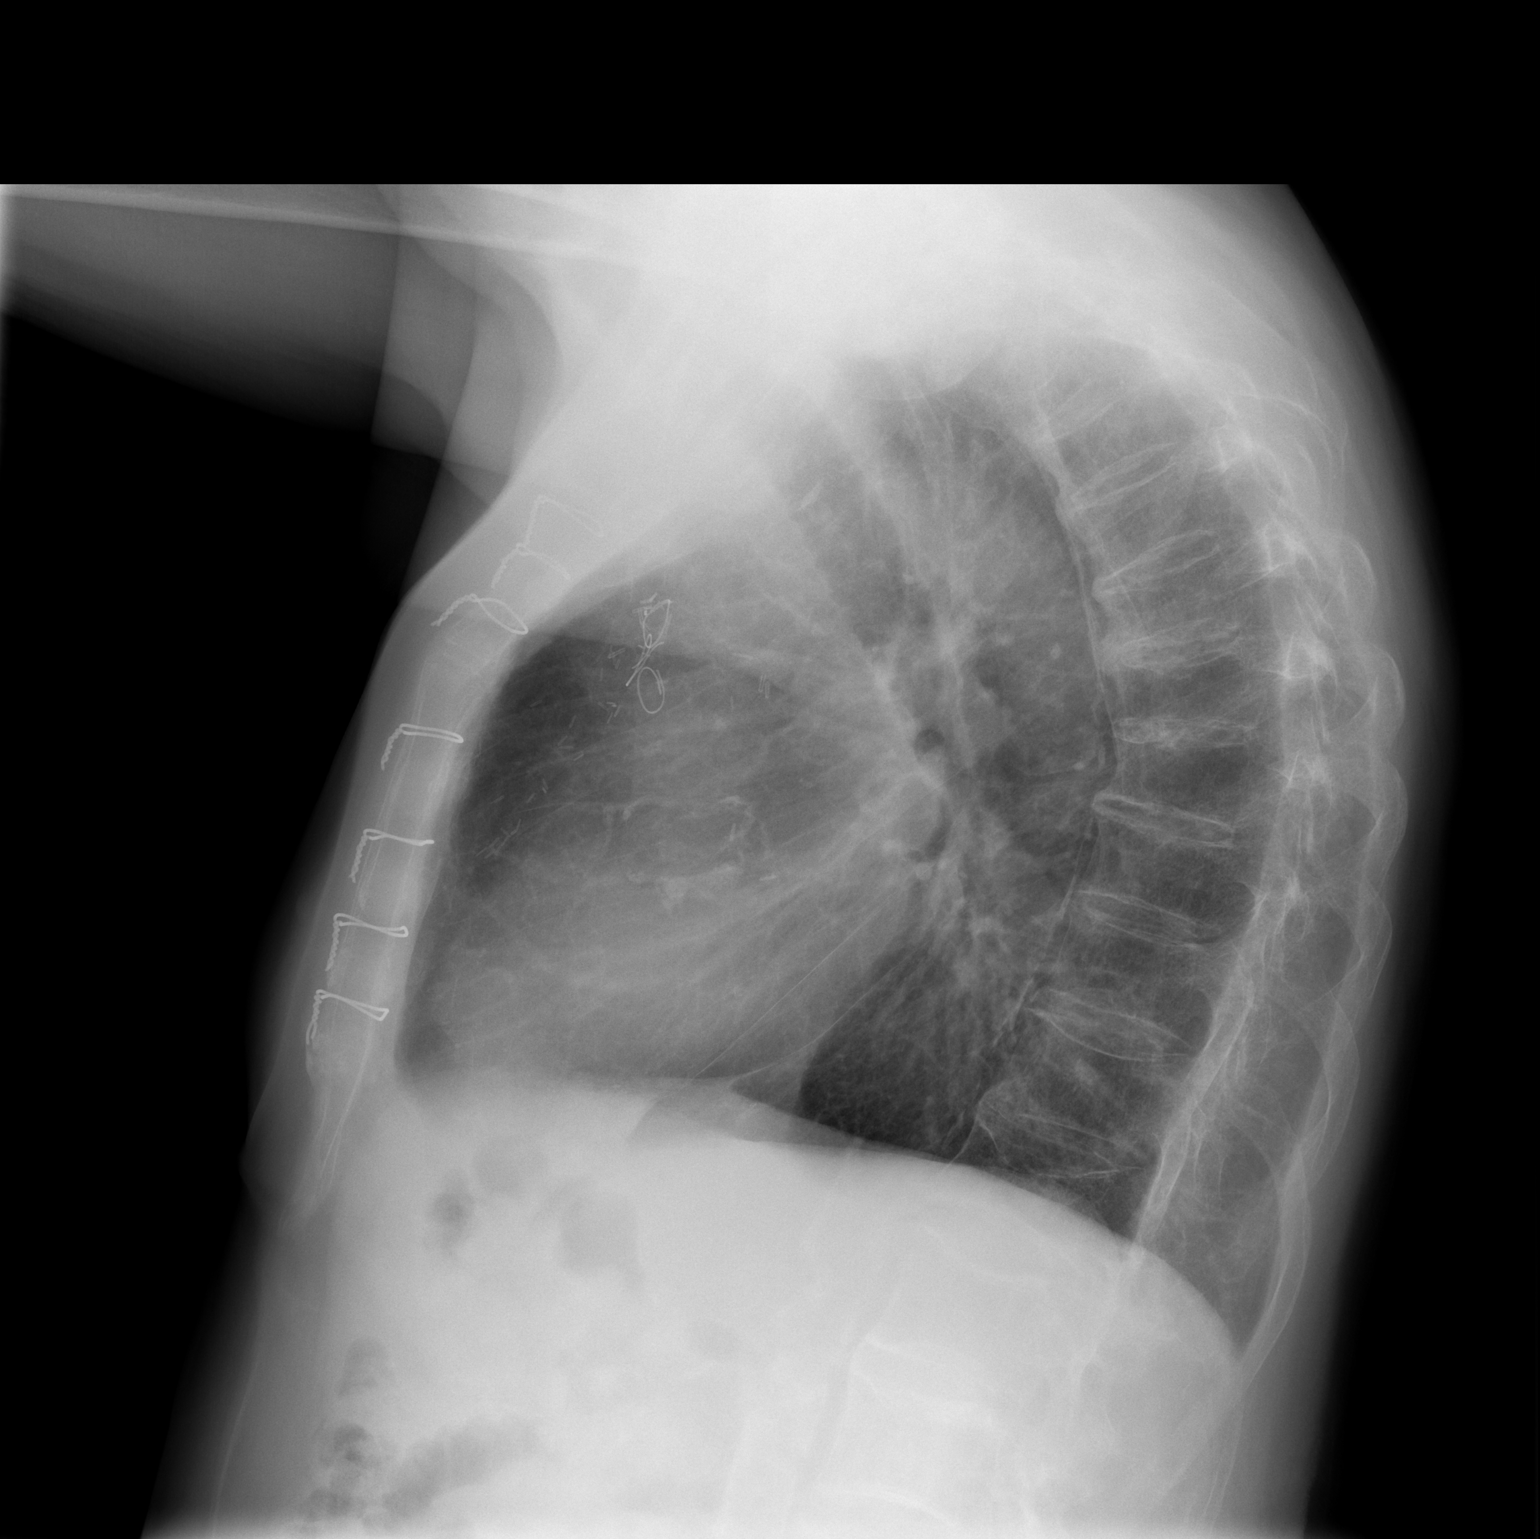

[w chest pa]
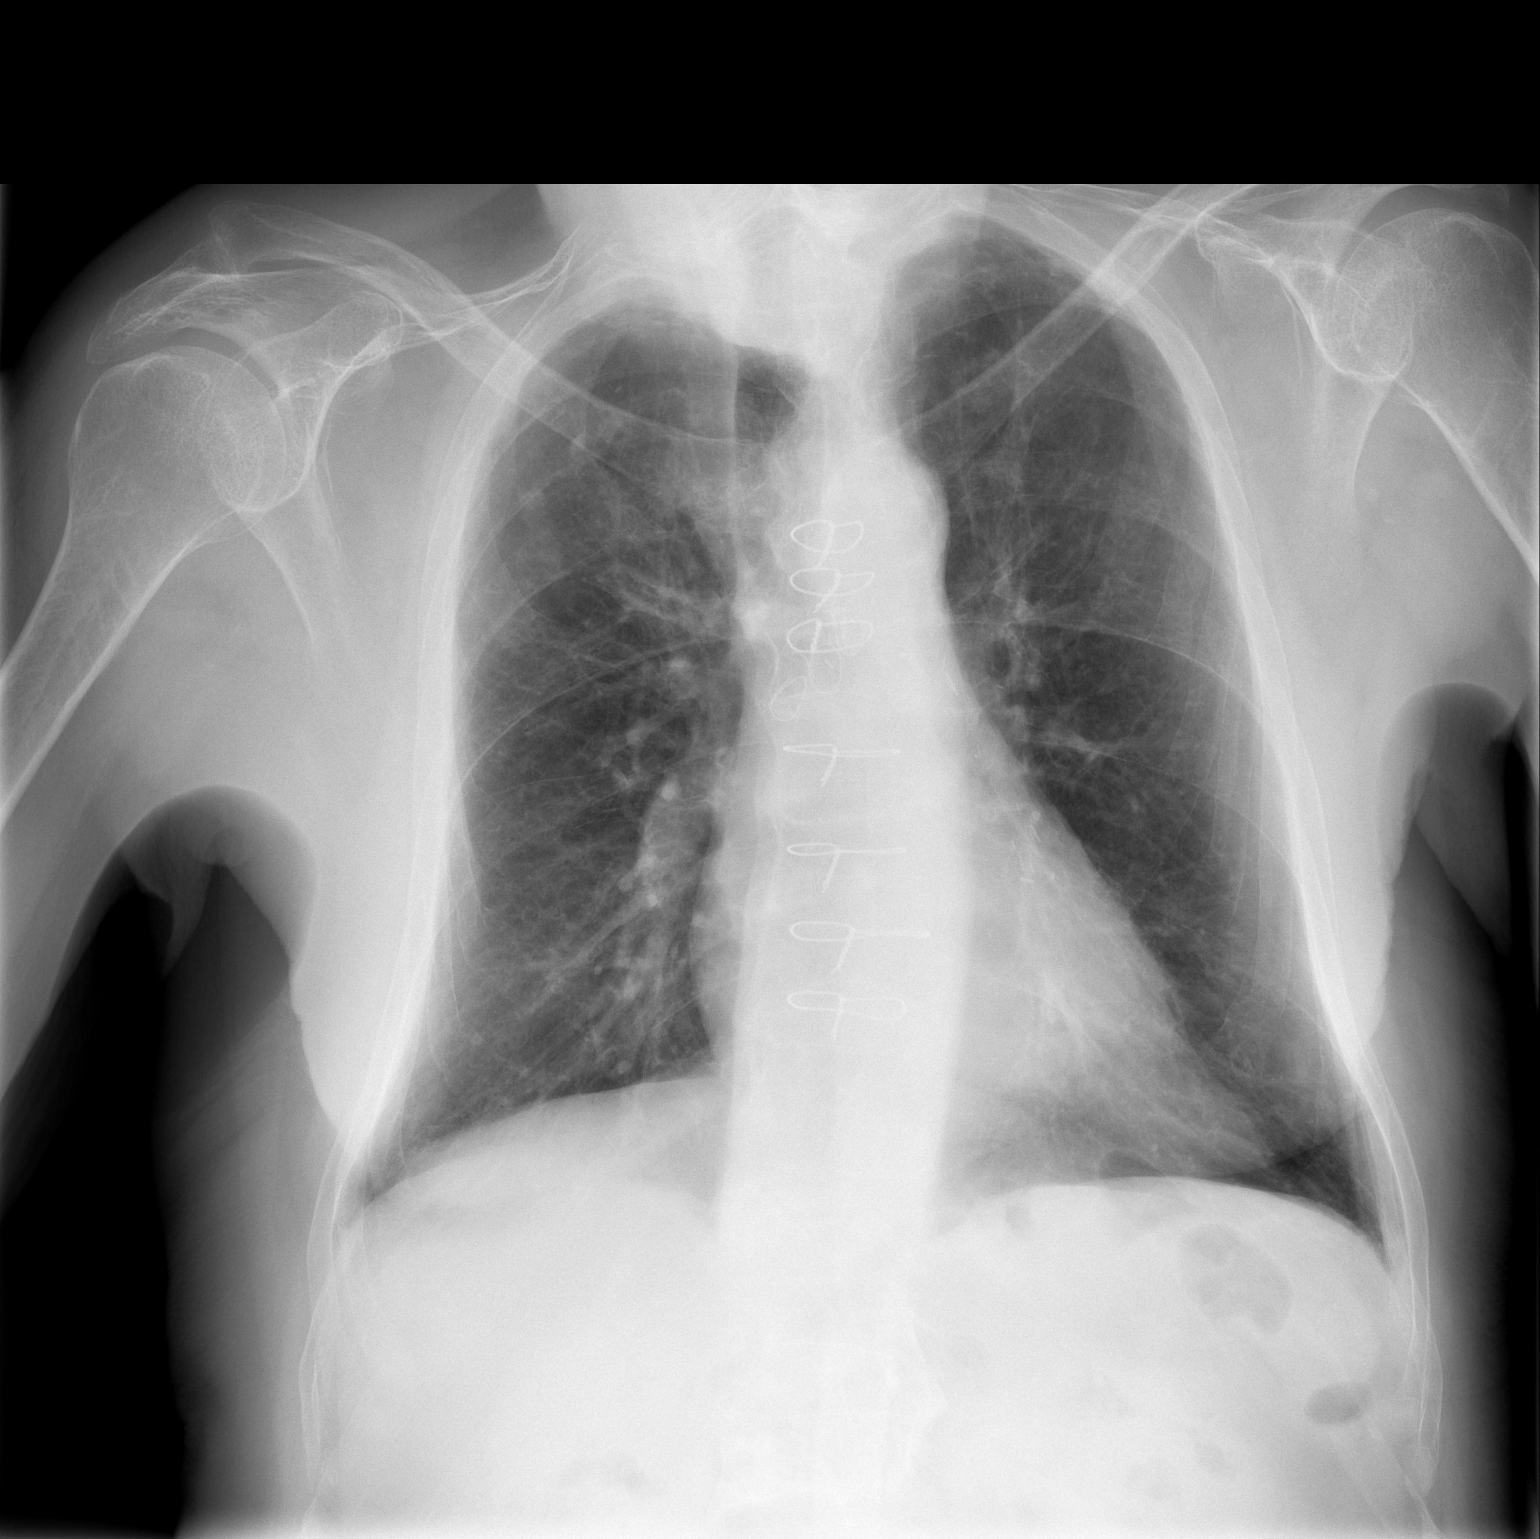

[2 of 2 positions shown; findings below may reference images not displayed]

FINDINGS: The lungs are mildly hyperinflated with hemidiaphragm flattening.
There is no focal infiltrate. There is no pleural effusion. The
patient has undergone previous CABG. The heart and pulmonary
vascularity are normal. The sternal wires are intact. There is
calcification in the wall of the aortic arch and descending thoracic
aorta. There is mild anterior wedging of 3 mid thoracic vertebral
bodies.
IMPRESSION: Mild chronic bronchitic changes. Previous CABG. No CHF nor
pneumonia.

Aortic atherosclerosis.

## 2016-11-16 ENCOUNTER — Encounter: Payer: Self-pay | Admitting: Physician Assistant

## 2016-11-16 ENCOUNTER — Ambulatory Visit (INDEPENDENT_AMBULATORY_CARE_PROVIDER_SITE_OTHER): Payer: Medicare Other | Admitting: Physician Assistant

## 2016-11-16 VITALS — BP 140/54 | HR 80 | Ht 71.0 in | Wt 174.5 lb

## 2016-11-16 DIAGNOSIS — I10 Essential (primary) hypertension: Secondary | ICD-10-CM

## 2016-11-16 DIAGNOSIS — I459 Conduction disorder, unspecified: Secondary | ICD-10-CM

## 2016-11-16 DIAGNOSIS — I2510 Atherosclerotic heart disease of native coronary artery without angina pectoris: Secondary | ICD-10-CM | POA: Diagnosis not present

## 2016-11-16 DIAGNOSIS — Z952 Presence of prosthetic heart valve: Secondary | ICD-10-CM | POA: Diagnosis not present

## 2016-11-16 NOTE — Progress Notes (Signed)
Cardiology Office Note    Date:  11/16/2016   ID:  Thomas English, DOB 12/29/1931, MRN [id number]  PCP:  Maryjean Kahristopher Street, MD  Cardiologist: Dr. Allyson SabalBerry EPS: Dr. Ladona Ridgelaylor TAVR: Dr. Excell Seltzerooper  Chief Complaint  Patient presents with  . Follow-up    History of Present Illness:  Thomas LooseOdell C Glassburn is a 82 y.o. male with a history of CAD s/p CABG, HTN, HLD, DMT2, and severe AS who [facility] for planned TAVR.   Over the past year the patient reports development of symptoms of exertional shortness of breath and fatigue. Follow-up echocardiogram performed 10/02/2016 revealed normal left ventricular systolic function with peak velocity across the aortic valve measured greater than 5 m/s corresponded to mean transvalvular gradient estimated 59 mmHg. The patient underwent diagnostic cardiac catheterization on 10/05/2016 that revealed severe native coronary artery disease but continued patency of bypass grafts to all major coronary artery distributions  Patient underwent TAVR 11/03/16 with normal valve function by echo. He developed new left bundle branch block and high-grade AV block after and underwent Biotronik dual-chamber pacemaker by Dr. Ladona Ridgelaylor.  Patient comes in today accompanied by his wife. He feels quite well. He hasn't done a lot of activity to see if he's had much improvement in his breathing but [place]. He denies chest pain, palpitations, dyspnea, dyspnea on exertion, dizziness or presyncope. He had some swelling over his pacemaker and wants it checked.  Past Medical History:  Diagnosis Date  . Aortic stenosis    a. s/p TAVR on 11/03/16  . Arthritis   . CAD (coronary artery disease)    a. CAD s/p CABG 2000   b. cath on 10/05/16 with severe native vessel CAD and patent bypass grafts  . Carotid stenosis   . Diabetes mellitus type 2, noninsulin dependent (HCC)   . Heart block AV second degree    a. 10/2016: after  TAVR. s/p Biotrionic PPM: (serial number [id number])   . Hyperlipidemia   . Hypertension   . Hypothyroidism     Past Surgical History:  Procedure Laterality Date  . BACK SURGERY    . CARDIAC CATHETERIZATION  11/10/1999   3-vessel disease, recommendation for CABG  . CARDIAC CATHETERIZATION N/A 10/05/2016   Procedure: Right/Left Heart Cath and Coronary/Graft Angiography;  Surgeon: Runell GessJonathan J Berry, MD;  [facility];  Service: Cardiovascular;  Laterality: N/A;  . CORONARY ARTERY BYPASS GRAFT  11/13/1999   LIMA to LAD and a vein graft to the first OM branch, second OM branch, and to the RCA (Dr. Wayland SalinasB. Bartle)  . EP IMPLANTABLE DEVICE N/A 11/05/2016   Procedure: Pacemaker Implant;  Surgeon: Marinus MawGregg W Taylor, MD;  [facility];  Service: Cardiovascular;  Laterality: N/A;  . EYE SURGERY     eye duct surgery  . NM MYOCAR PERF WALL MOTION  09/2013   normal lexiscan myoview  . PERIPHERAL VASCULAR CATHETERIZATION N/A 10/05/2016   Procedure: Abdominal Aortogram;  Surgeon: Runell GessJonathan J Berry, MD;  [facility];  Service: Cardiovascular;  Laterality: N/A;  . TEE WITHOUT CARDIOVERSION N/A 11/03/2016   Procedure: TRANSESOPHAGEAL ECHOCARDIOGRAM (TEE);  Surgeon: Tonny BollmanMichael Cooper, MD;  [facility];  Service: Open Heart Surgery;  Laterality: N/A;  . TRANSCATHETER AORTIC VALVE REPLACEMENT, TRANSFEMORAL N/A 11/03/2016   Procedure: TRANSCATHETER AORTIC VALVE REPLACEMENT, TRANSFEMORAL;  Surgeon: Tonny BollmanMichael Cooper, MD;  [facility];  Service: Open Heart Surgery;  Laterality: N/A;  .  TRANSTHORACIC ECHOCARDIOGRAM  12/2013   EF 55-60%, grade 1 diastolic dysfunction, high ventricular filling pressure; severe AV stenosis; calcified MV annulus; LA & RA mildly dilated; atrial septal aneurysm    Current Medications: Outpatient Medications Prior to Visit  Medication Sig Dispense Refill  . amLODipine (NORVASC) 10 MG tablet Take 10 mg by mouth daily with breakfast.   6  .  benazepril (LOTENSIN) 20 MG tablet Take 20 mg by mouth daily with breakfast.   6  . carvedilol (COREG) 6.25 MG tablet Take 6.25 mg by mouth 2 (two) times daily with a meal.    . clopidogrel (PLAVIX) 75 MG tablet Take 75 mg by mouth daily.     Marland Kitchen. levothyroxine (SYNTHROID, LEVOTHROID) 88 MCG tablet Take 88 mcg by mouth every evening.   0  . Lutein 6 MG CAPS Take 6 mg by mouth daily with breakfast.     . metFORMIN (GLUCOPHAGE) 500 MG tablet Take 500 mg by mouth daily with breakfast.    . simvastatin (ZOCOR) 20 MG tablet Take 20 mg by mouth every evening.     Marland Kitchen. aspirin 81 MG chewable tablet Chew 1 tablet (81 mg total) by mouth daily. (Patient not taking: Reported on 11/16/2016)     No facility-administered medications prior to visit.      Allergies:   Patient has no known allergies.   Social History   Social History  . Marital status: Married    Spouse name: N/A  . Number of children: 2  . Years of education: GED   Social History Main Topics  . Smoking status: Former Smoker    Packs/day: 1.00    Years: 5.00    Types: Cigarettes    Quit date: 07/20/1973  . Smokeless tobacco: Never Used  . Alcohol use No  . Drug use: No  . Sexual activity: Not Asked   Other Topics Concern  . None   Social History Narrative  . None     Family History:  The patient's family history includes CAD in his sister; Colon cancer in his father; Diabetes in his sister; Lung disease in his mother.   ROS:   Please see the history of present illness.    Review of Systems  Constitution: Negative.  HENT: Positive for hearing loss.   Respiratory: Negative.   Endocrine: Negative.   Hematologic/Lymphatic: Negative.   Musculoskeletal: Negative.   Gastrointestinal: Negative.   Genitourinary: Negative.   Neurological: Negative.    All other systems reviewed and are negative.   PHYSICAL EXAM:   VS:  BP (!) 140/54   Pulse 80   Ht 5\' 11"  (1.803 m)   Wt 174 lb 8 oz (79.2 kg)   BMI 24.34 kg/m   Physical  Exam  GEN: Well nourished, well developed, in no acute distress  Neck: no JVD, carotid bruits, or masses Cardiac:Pacer site little puffy but resolving hematoma and bruising down left chest, RRR; 1/6 systolic murmur at the left sternal border no rubs, or gallops  Respiratory:  clear to auscultation bilaterally, normal work of breathing GI: soft, nontender, nondistended, + BS Ext: Left and right groin without hematoma or hemorrhage at catheter sites otherwise lower extremities without cyanosis, clubbing, or edema, Good distal pulses bilaterally MS: no deformity or atrophy  Skin: warm and dry, no rash Psych: euthymic mood, full affect  Wt Readings from Last 3 Encounters:  11/16/16 174 lb 8 oz (79.2 kg)  11/06/16 171 lb 14.4 oz (78 kg)  10/30/16 173 lb (78.5  kg)      Studies/Labs Reviewed:   EKG:  EKG is ordered today.  The ekg ordered today demonstrates Normal sinus rhythm first-degree AV block  Recent Labs: 10/30/2016: ALT 14 11/04/2016: BUN 9; Creatinine, Ser 1.09; Hemoglobin 10.2; Magnesium 2.1; Platelets 114; Potassium 4.0; Sodium 138   Lipid Panel No results found for: CHOL, TRIG, HDL, CHOLHDL, VLDL, LDLCALC, LDLDIRECT  Additional studies/ records that were reviewed today include:  2D ECHO: 11/04/2016 LV EF: 55% -   60% Study Conclusions - Left ventricle: The cavity size was normal. There was mild   concentric hypertrophy. Systolic function was normal. The   estimated ejection fraction was in the range of 55% to 60%. Wall   motion was normal; there were no regional wall motion   abnormalities. Features are consistent with a pseudonormal left   ventricular filling pattern, with concomitant abnormal relaxation   and increased filling pressure (grade 2 diastolic dysfunction). - Aortic valve: A 26mm Edwards Sapien 3 TAVR bioprosthesis was   present and well-seated. There was no regurgitation. Peak   velocity (S): 195 cm/s. Mean gradient (S): 8 mm Hg. Valve area   (VTI): 2.45  cm^2. Valve area (Vmax): 2.32 cm^2. Valve area   (Vmean): 2.46 cm^2. - Mitral valve: Calcified annulus. Transvalvular velocity was   within the normal range. There was no evidence for stenosis.   There was mild regurgitation. - Right ventricle: The cavity size was normal. Wall thickness was   normal. Systolic function was normal. - Tricuspid valve: There was mild regurgitation. - Pulmonary arteries: Systolic pressure was within the normal   range. PA peak pressure: 24 mm Hg (S).   _____________     ASSESSMENT:    1. S/P TAVR (transcatheter aortic valve replacement)   2. Heart block s/p Biotronic PPM 10/2016   3. Essential hypertension   4. Coronary artery disease involving native coronary artery of native heart without angina pectoris      PLAN:  In order of problems listed above:  S/P TAVR with normal functioning valve post op echo.Patient doing well. He is scheduled for his follow-up echo and appointment with Dr. Excell Seltzer December 21.  Heart block after TAVR status post Biotronik pacemaker. Pacer site has resolving hematoma and healing well. He is scheduled for a wound check and pacer education this Friday. Follow-up with Dr. Ladona Ridgel in 2 months.  Essential hypertension controlled  CAD status post recent cath showing patent bypass grafts. No angina      Medication Adjustments/Labs and Tests Ordered: Current medicines are reviewed at length with the patient today.  Concerns regarding medicines are outlined above.  Medication changes, Labs and Tests ordered today are listed in the Patient Instructions below. Patient Instructions  Medication Instructions:  Your physician recommends that you continue on your current medications as directed. Please refer to the Current Medication list given to you today.  Labwork: NONE  Testing/Procedures: NONE  If you need a refill on your cardiac medications before your next appointment, please call your pharmacy.        Elson Clan, PA-C  11/16/2016 2:38 PM    [facility] [address] [place], [place], [place]  [address] Phone: [phone]; Fax: [phone]

## 2016-11-16 NOTE — Patient Instructions (Addendum)
Medication Instructions:  Your physician recommends that you continue on your current medications as directed. Please refer to the Current Medication list given to you today.  Labwork: NONE  Testing/Procedures: NONE  If you need a refill on your cardiac medications before your next appointment, please call your pharmacy.    

## 2016-11-20 ENCOUNTER — Ambulatory Visit (INDEPENDENT_AMBULATORY_CARE_PROVIDER_SITE_OTHER): Payer: Medicare Other | Admitting: *Deleted

## 2016-11-20 DIAGNOSIS — Z950 Presence of cardiac pacemaker: Secondary | ICD-10-CM

## 2016-11-20 DIAGNOSIS — I459 Conduction disorder, unspecified: Secondary | ICD-10-CM | POA: Diagnosis not present

## 2016-11-20 LAB — CUP PACEART INCLINIC DEVICE CHECK
Brady Statistic RA Percent Paced: 32 %
Brady Statistic RV Percent Paced: 36 %
Implantable Lead Implant Date: 20171116
Implantable Lead Location: 753859
Implantable Lead Model: 377
Implantable Lead Model: 377
Implantable Pulse Generator Implant Date: 20171116
Lead Channel Impedance Value: 526 Ohm
Lead Channel Pacing Threshold Amplitude: 0.4 V
Lead Channel Pacing Threshold Pulse Width: 0.4 ms
Lead Channel Pacing Threshold Pulse Width: 0.4 ms
Lead Channel Sensing Intrinsic Amplitude: 12.4 mV
Lead Channel Setting Pacing Amplitude: 3 V
Lead Channel Setting Pacing Pulse Width: 0.4 ms
MDC IDC LEAD IMPLANT DT: 20171116
MDC IDC LEAD LOCATION: 753860
MDC IDC LEAD SERIAL: 49591424
MDC IDC LEAD SERIAL: 49679947
MDC IDC MSMT LEADCHNL RA PACING THRESHOLD AMPLITUDE: 0.7 V
MDC IDC MSMT LEADCHNL RA SENSING INTR AMPL: 4.6 mV
MDC IDC MSMT LEADCHNL RV IMPEDANCE VALUE: 702 Ohm
MDC IDC SESS DTM: 20171201121748
MDC IDC SET LEADCHNL RA PACING AMPLITUDE: 3 V
Pulse Gen Serial Number: 68853186

## 2016-11-20 NOTE — Progress Notes (Signed)
Wound check appointment, device checked by industry. Steri-strips removed. Wound without redness or edema, healing yellowish ecchymosis noted. Incision edges approximated, wound well healed. Normal device function. Thresholds, sensing, and impedances consistent with implant measurements. Device programmed at 3.0V for extra safety margin until 3 month visit. Histogram distribution appropriate for patient and level of activity. No mode switches or high ventricular rates noted. Patient educated about wound care, arm mobility, lifting restrictions. ROV with GT on 02/05/17.

## 2016-11-26 DIAGNOSIS — H5213 Myopia, bilateral: Secondary | ICD-10-CM | POA: Diagnosis not present

## 2016-11-26 DIAGNOSIS — H35371 Puckering of macula, right eye: Secondary | ICD-10-CM | POA: Diagnosis not present

## 2016-12-10 ENCOUNTER — Ambulatory Visit (INDEPENDENT_AMBULATORY_CARE_PROVIDER_SITE_OTHER): Payer: Medicare Other | Admitting: Cardiovascular Disease

## 2016-12-10 ENCOUNTER — Ambulatory Visit (HOSPITAL_COMMUNITY): Payer: Medicare Other | Attending: Internal Medicine

## 2016-12-10 ENCOUNTER — Encounter: Payer: Self-pay | Admitting: Cardiovascular Disease

## 2016-12-10 ENCOUNTER — Other Ambulatory Visit: Payer: Self-pay

## 2016-12-10 VITALS — BP 154/70 | HR 64 | Ht 71.0 in

## 2016-12-10 DIAGNOSIS — Z951 Presence of aortocoronary bypass graft: Secondary | ICD-10-CM | POA: Diagnosis not present

## 2016-12-10 DIAGNOSIS — Z953 Presence of xenogenic heart valve: Secondary | ICD-10-CM | POA: Insufficient documentation

## 2016-12-10 DIAGNOSIS — E785 Hyperlipidemia, unspecified: Secondary | ICD-10-CM | POA: Diagnosis not present

## 2016-12-10 DIAGNOSIS — Z952 Presence of prosthetic heart valve: Secondary | ICD-10-CM | POA: Diagnosis not present

## 2016-12-10 DIAGNOSIS — I2510 Atherosclerotic heart disease of native coronary artery without angina pectoris: Secondary | ICD-10-CM | POA: Insufficient documentation

## 2016-12-10 DIAGNOSIS — I441 Atrioventricular block, second degree: Secondary | ICD-10-CM | POA: Diagnosis not present

## 2016-12-10 DIAGNOSIS — E039 Hypothyroidism, unspecified: Secondary | ICD-10-CM | POA: Insufficient documentation

## 2016-12-10 DIAGNOSIS — E119 Type 2 diabetes mellitus without complications: Secondary | ICD-10-CM | POA: Diagnosis not present

## 2016-12-10 DIAGNOSIS — I350 Nonrheumatic aortic (valve) stenosis: Secondary | ICD-10-CM | POA: Diagnosis not present

## 2016-12-10 DIAGNOSIS — I119 Hypertensive heart disease without heart failure: Secondary | ICD-10-CM | POA: Insufficient documentation

## 2016-12-10 DIAGNOSIS — I059 Rheumatic mitral valve disease, unspecified: Secondary | ICD-10-CM | POA: Insufficient documentation

## 2016-12-10 MED ORDER — AMOXICILLIN 500 MG PO CAPS: ORAL_CAPSULE | ORAL | 0 refills | Status: AC

## 2016-12-10 NOTE — Patient Instructions (Addendum)
Medication Instructions:  Your physician recommends that you continue on your current medications as directed. Please refer to the Current Medication list given to you today.  Your physician discussed the importance of taking an antibiotic prior to any dental, gastrointestinal, genitourinary procedures to prevent damage to the heart valves from infection. You were given a prescription for an antibiotic based on current SBE prophylaxis guidelines.  Labwork: none  Testing/Procedures: Your physician has requested that you have an echocardiogram in 1 year prior to your appointment with Dr. Excell Seltzerooper. Echocardiography is a painless test that uses sound waves to create images of your heart. It provides your doctor with information about the size and shape of your heart and how well your heart's chambers and valves are working. This procedure takes approximately one hour. There are no restrictions for this procedure.    Follow-Up: Your physician recommends that you schedule a follow-up appointment in: Dr. Allyson SabalBerry in 3 months.  Your physician wants you to follow-up in: 1 year with Dr. Excell Seltzerooper. You will receive a reminder letter in the mail two months in advance. If you don't receive a letter, please call our office to schedule the follow-up appointment.    Any Other Special Instructions Will Be Listed Below (If Applicable).     If you need a refill on your cardiac medications before your next appointment, please call your pharmacy.

## 2016-12-10 NOTE — Progress Notes (Signed)
Cardiology Office Note Date:  12/10/2016   ID:  Thomas English, DOB 09/23/1932, MRN [id number]  PCP:  Maryjean Kahristopher Street, MD  Cardiologist:  Tonny Bollmanooper, Althia Egolf, MD    Chief Complaint  Patient presents with  . Aortic Stenosis     History of Present Illness: Thomas English is a 82 y.o. male who presents for 30-day TAVR follow-up.  The patient underwent TAVR with a 26 mm Edwards Sapien 3 valve via a percutaneous transfemoral approach 11/03/2016 for treatment of Stage D, severe aortic stenosis. His post-operative course was complicated by heart block requiring permanent pacemaker placement.   He is here with his daughter today. He's doing very well. Breathing is significantly improved. Today, he denies symptoms of palpitations, chest pain, orthopnea, PND, lower extremity edema, dizziness, or syncope. Hasn't been as active as he has been in the past as he has been following post-pacemaker restrictions, but now able to resume normal activities.    Past Medical History:  Diagnosis Date  . Aortic stenosis    a. s/p TAVR on 11/03/16  . Arthritis   . CAD (coronary artery disease)    a. CAD s/p CABG 2000   b. cath on 10/05/16 with severe native vessel CAD and patent bypass grafts  . Carotid stenosis   . Diabetes mellitus type 2, noninsulin dependent (HCC)   . Heart block AV second degree    a. 10/2016: after TAVR. s/p Biotrionic PPM: (serial number [id number])   . Hyperlipidemia   . Hypertension   . Hypothyroidism     Past Surgical History:  Procedure Laterality Date  . BACK SURGERY    . CARDIAC CATHETERIZATION  11/10/1999   3-vessel disease, recommendation for CABG  . CARDIAC CATHETERIZATION N/A 10/05/2016   Procedure: Right/Left Heart Cath and Coronary/Graft Angiography;  Surgeon: Runell GessJonathan J Berry, MD;  [facility];  Service: Cardiovascular;  Laterality: N/A;  . CORONARY ARTERY BYPASS GRAFT  11/13/1999   LIMA to LAD and a vein graft to the first OM branch, second  OM branch, and to the RCA (Dr. Wayland SalinasB. Bartle)  . EP IMPLANTABLE DEVICE N/A 11/05/2016   Procedure: Pacemaker Implant;  Surgeon: Marinus MawGregg W Taylor, MD;  [facility];  Service: Cardiovascular;  Laterality: N/A;  . EYE SURGERY     eye duct surgery  . NM MYOCAR PERF WALL MOTION  09/2013   normal lexiscan myoview  . PERIPHERAL VASCULAR CATHETERIZATION N/A 10/05/2016   Procedure: Abdominal Aortogram;  Surgeon: Runell GessJonathan J Berry, MD;  [facility];  Service: Cardiovascular;  Laterality: N/A;  . TEE WITHOUT CARDIOVERSION N/A 11/03/2016   Procedure: TRANSESOPHAGEAL ECHOCARDIOGRAM (TEE);  Surgeon: Tonny BollmanMichael Ryaan Vanwagoner, MD;  [facility];  Service: Open Heart Surgery;  Laterality: N/A;  . TRANSCATHETER AORTIC VALVE REPLACEMENT, TRANSFEMORAL N/A 11/03/2016   Procedure: TRANSCATHETER AORTIC VALVE REPLACEMENT, TRANSFEMORAL;  Surgeon: Tonny BollmanMichael Stonewall Doss, MD;  [facility];  Service: Open Heart Surgery;  Laterality: N/A;  . TRANSTHORACIC ECHOCARDIOGRAM  12/2013   EF 55-60%, grade 1 diastolic dysfunction, high ventricular filling pressure; severe AV stenosis; calcified MV annulus; LA & RA mildly dilated; atrial septal aneurysm    Current Outpatient Prescriptions  Medication Sig Dispense Refill  . amLODipine (NORVASC) 10 MG tablet Take 10 mg by mouth daily with breakfast.   6  . aspirin EC 81 MG tablet Take 81 mg by mouth daily.    . benazepril (LOTENSIN) 20 MG tablet Take 20 mg by mouth daily with breakfast.  6  . carvedilol (COREG) 6.25 MG tablet Take 6.25 mg by mouth 2 (two) times daily with a meal.    . clopidogrel (PLAVIX) 75 MG tablet Take 75 mg by mouth daily.     Marland Kitchen. levothyroxine (SYNTHROID, LEVOTHROID) 88 MCG tablet Take 88 mcg by mouth every evening.   0  . Lutein 6 MG CAPS Take 6 mg by mouth daily with breakfast.     . metFORMIN (GLUCOPHAGE) 500 MG tablet Take 500 mg by mouth daily with breakfast.    . simvastatin (ZOCOR) 20 MG tablet Take 20 mg by mouth every evening.     Marland Kitchen.  amoxicillin (AMOXIL) 500 MG capsule Take 4 tablets I hour before dental procedure 4 capsule 0   No current facility-administered medications for this visit.     Allergies:   Patient has no known allergies.   Social History:  The patient  reports that he quit smoking about 43 years ago. His smoking use included Cigarettes. He has a 5.00 pack-year smoking history. He has never used smokeless tobacco. He reports that he does not drink alcohol or use drugs.   Family History:  The patient's  family history includes CAD in his sister; Colon cancer in his father; Diabetes in his sister; Lung disease in his mother.    ROS:  Please see the history of present illness. All other systems are reviewed and negative.    PHYSICAL EXAM: VS:  BP (!) 154/70   Pulse 64   Ht 5\' 11"  (1.803 m)  , BMI There is no height or weight on file to calculate BMI. GEN: Well nourished, well developed, in no acute distress  HEENT: normal  Neck: no JVD, no masses. Cardiac: RRR with soft SEM at the RUSB           Respiratory:  clear to auscultation bilaterally, normal work of breathing GI: soft, nontender, nondistended, + BS MS: no deformity or atrophy  Ext: no pretibial edema, pedal pulses 2+= bilaterally Skin: warm and dry, no rash Neuro:  Strength and sensation are intact Psych: euthymic mood, full affect  EKG:  EKG is not ordered today.  Recent Labs: 10/30/2016: ALT 14 11/04/2016: BUN 9; Creatinine, Ser 1.09; Hemoglobin 10.2; Magnesium 2.1; Platelets 114; Potassium 4.0; Sodium 138   Lipid Panel  No results found for: CHOL, TRIG, HDL, CHOLHDL, VLDL, LDLCALC, LDLDIRECT    Wt Readings from Last 3 Encounters:  11/16/16 174 lb 8 oz (79.2 kg)  11/06/16 171 lb 14.4 oz (78 kg)  10/30/16 173 lb (78.5 kg)     Cardiac Studies Reviewed: Echo done today shows normal LV function, normal transaortic gradients and no aortic insufficiency.   ASSESSMENT AND PLAN: 1.  Severe aortic stenosis s/p TAVR: NYHA I sx's.  Progressing very well. Echo reviewed and shows normal function of the transcatheter heart valve. SBE prophylaxis reviewed. Should continue ASA and plavix x 6 months per protocol.   2. Complete heart block s/p PPM placement: pacemaker site looks well-healed.   Current medicines are reviewed with the patient today.  The patient does not have concerns regarding medicines.  Labs/ tests ordered today include:   Orders Placed This Encounter  Procedures  . ECHOCARDIOGRAM COMPLETE    Disposition:   FU Dr Allyson SabalBerry 3 months, Valve clinic 1 year with echo per protocol.   Enzo BiSigned, Robbie Rideaux, MD  12/10/2016 5:45 PM    [facility] [address], [place], [place]  [address] Phone: [phone]; Fax: [phone]  

## 2016-12-22 IMAGING — CR DG CHEST 1V PORT
1 series · 1 of 1 positions shown · non-contrast
Comparison: 10/30/2016

CLINICAL DATA: Post aortic valve replacement.

EXAM:
PORTABLE CHEST 1 VIEW

[portable]
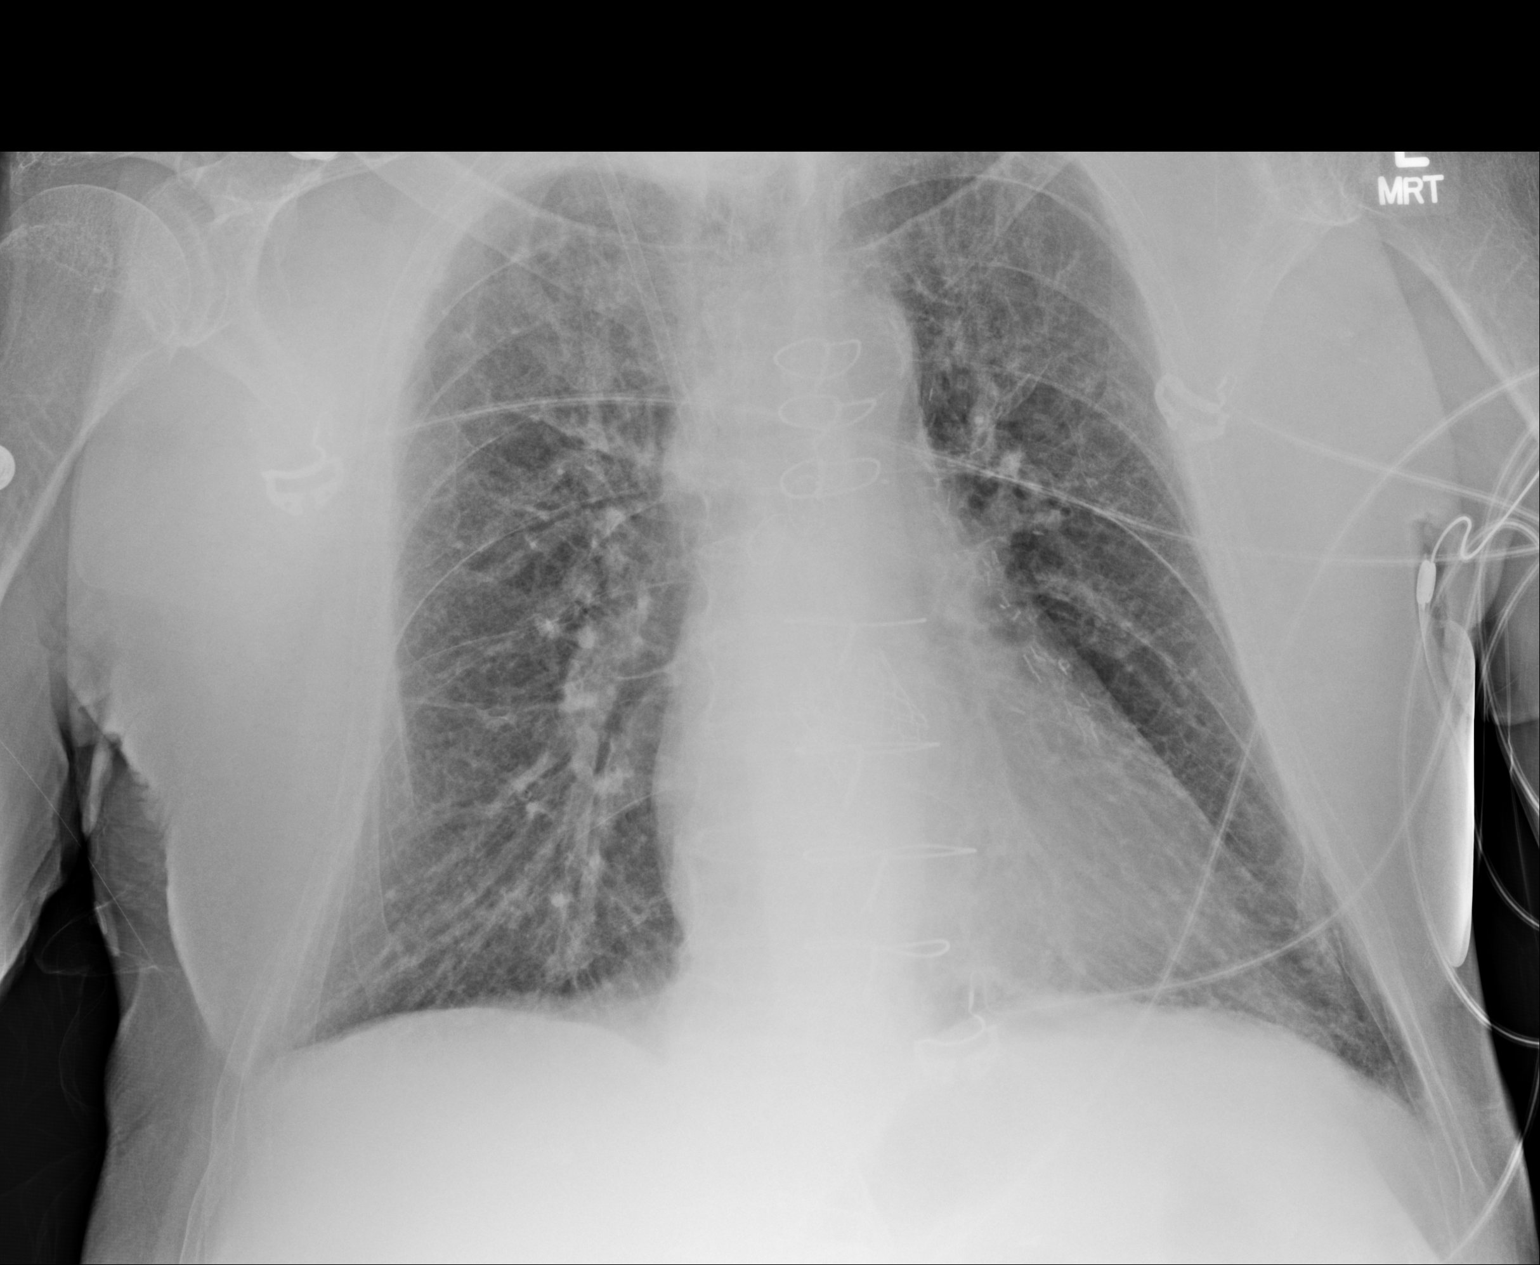

[1 of 1 positions shown; findings below may reference images not displayed]

FINDINGS: Prior median sternotomy, CABG and valve replacement. Mild
hyperinflation of the lungs. Heart is borderline in size. Mild
interstitial prominence throughout the lungs which could reflect
interstitial edema. No pneumothorax or effusion.
IMPRESSION: Increasing interstitial prominence throughout the lungs which could
reflect interstitial edema.

## 2016-12-23 IMAGING — CR DG CHEST 1V PORT
1 series · 1 of 1 positions shown · non-contrast
Comparison: Yesterday

CLINICAL DATA: Sore chest after aortic valve replacement

EXAM:
PORTABLE CHEST 1 VIEW

[AP]
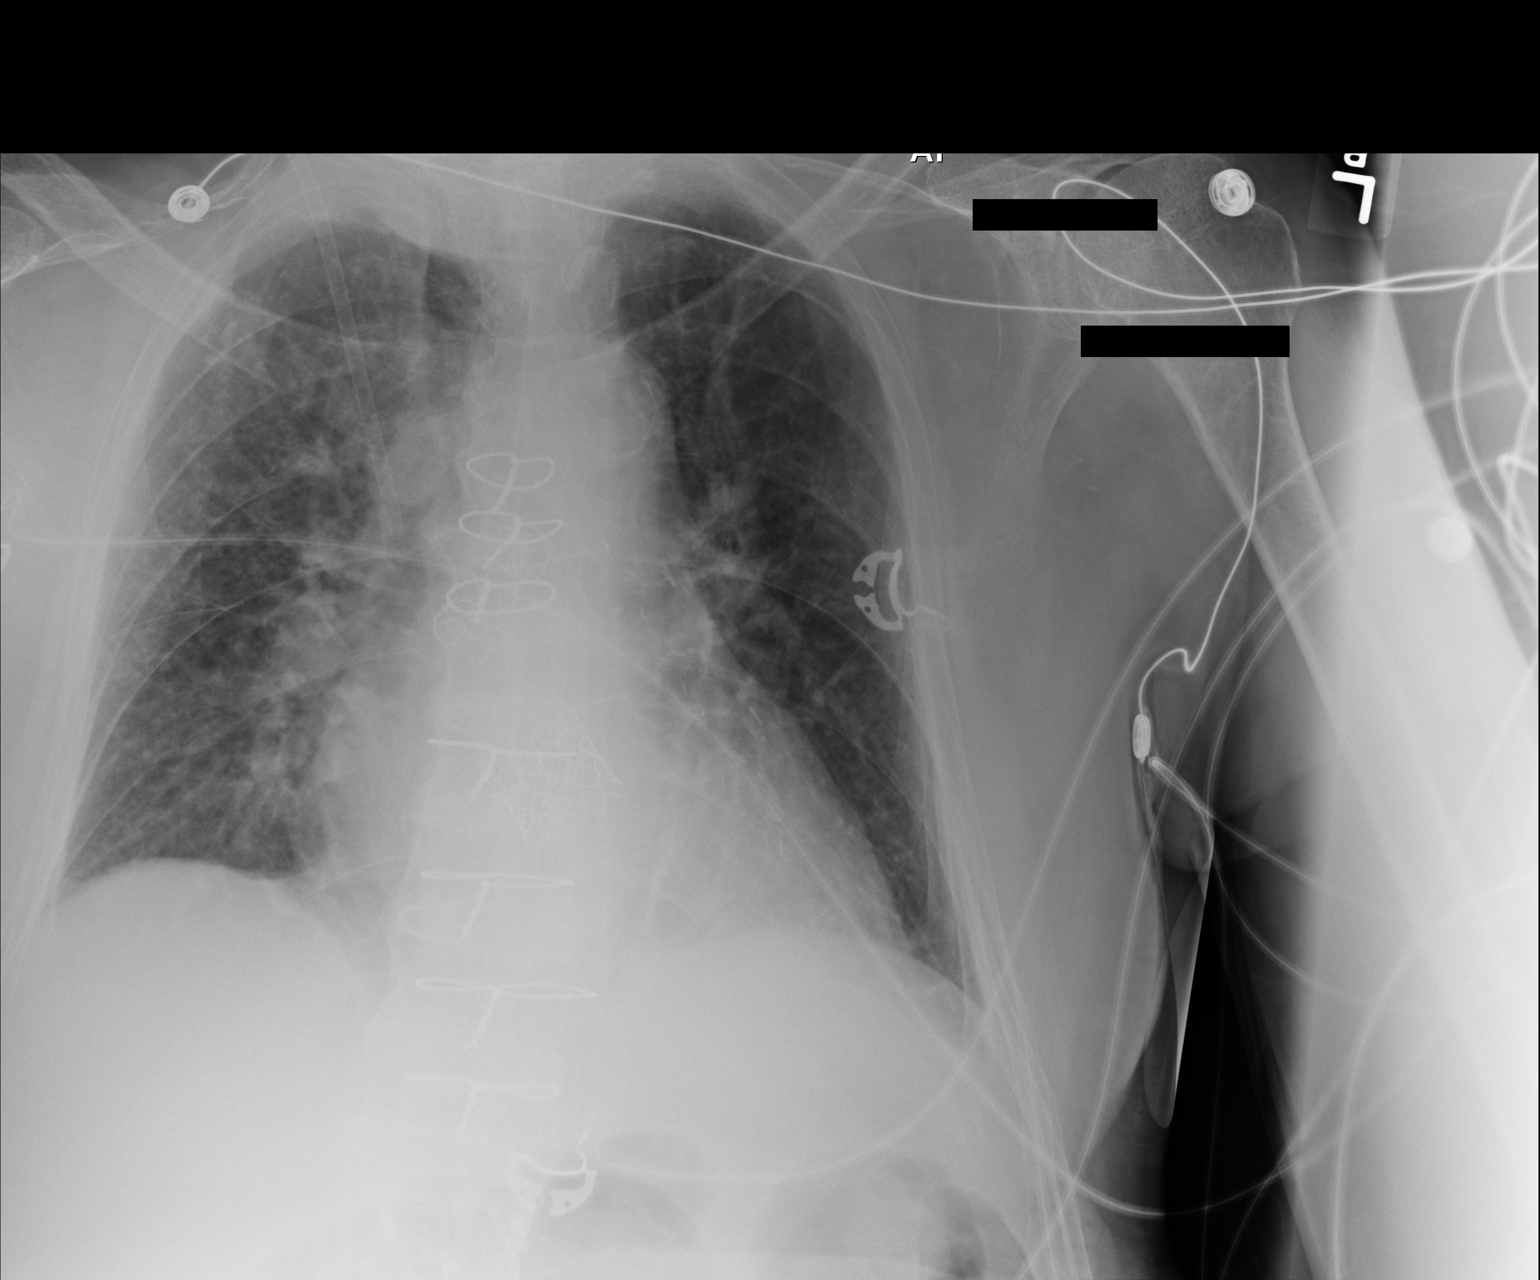

[1 of 1 positions shown; findings below may reference images not displayed]

FINDINGS: Right IJ sheath in unchanged position. Stable heart size after
aortic valve replacement. Pre-existing CABG. Hyperinflation and
pulmonary venous congestion. No effusion or pneumothorax.
IMPRESSION: 1. Stable from yesterday.
2. COPD and pulmonary vascular congestion.

## 2016-12-25 IMAGING — CR DG CHEST 2V
2 series · 2 of 2 positions shown · non-contrast
Comparison: 11/04/2016

CLINICAL DATA: Pacemaker and heart valve placement.

EXAM:
CHEST  2 VIEW

[chest lat]
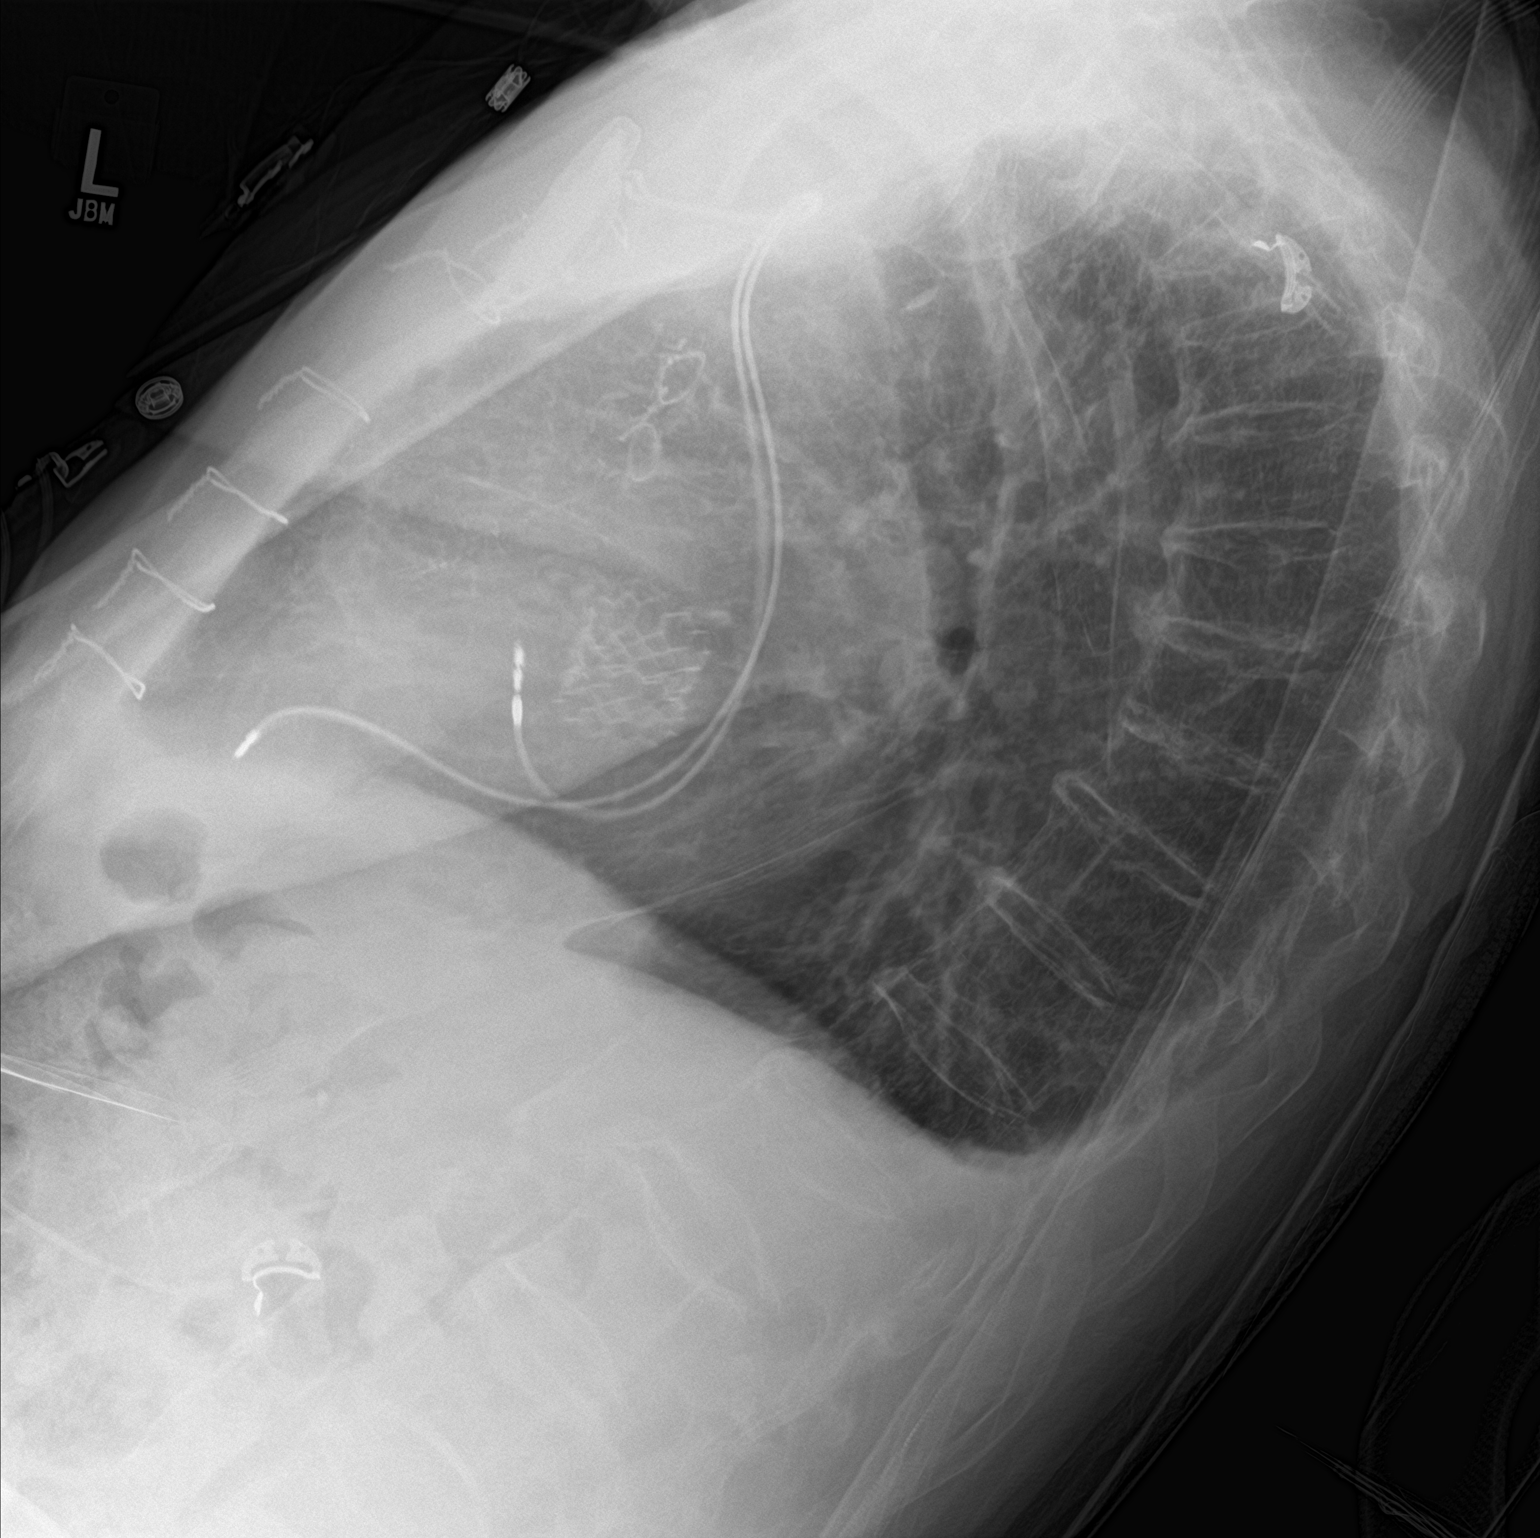

[chest ap]
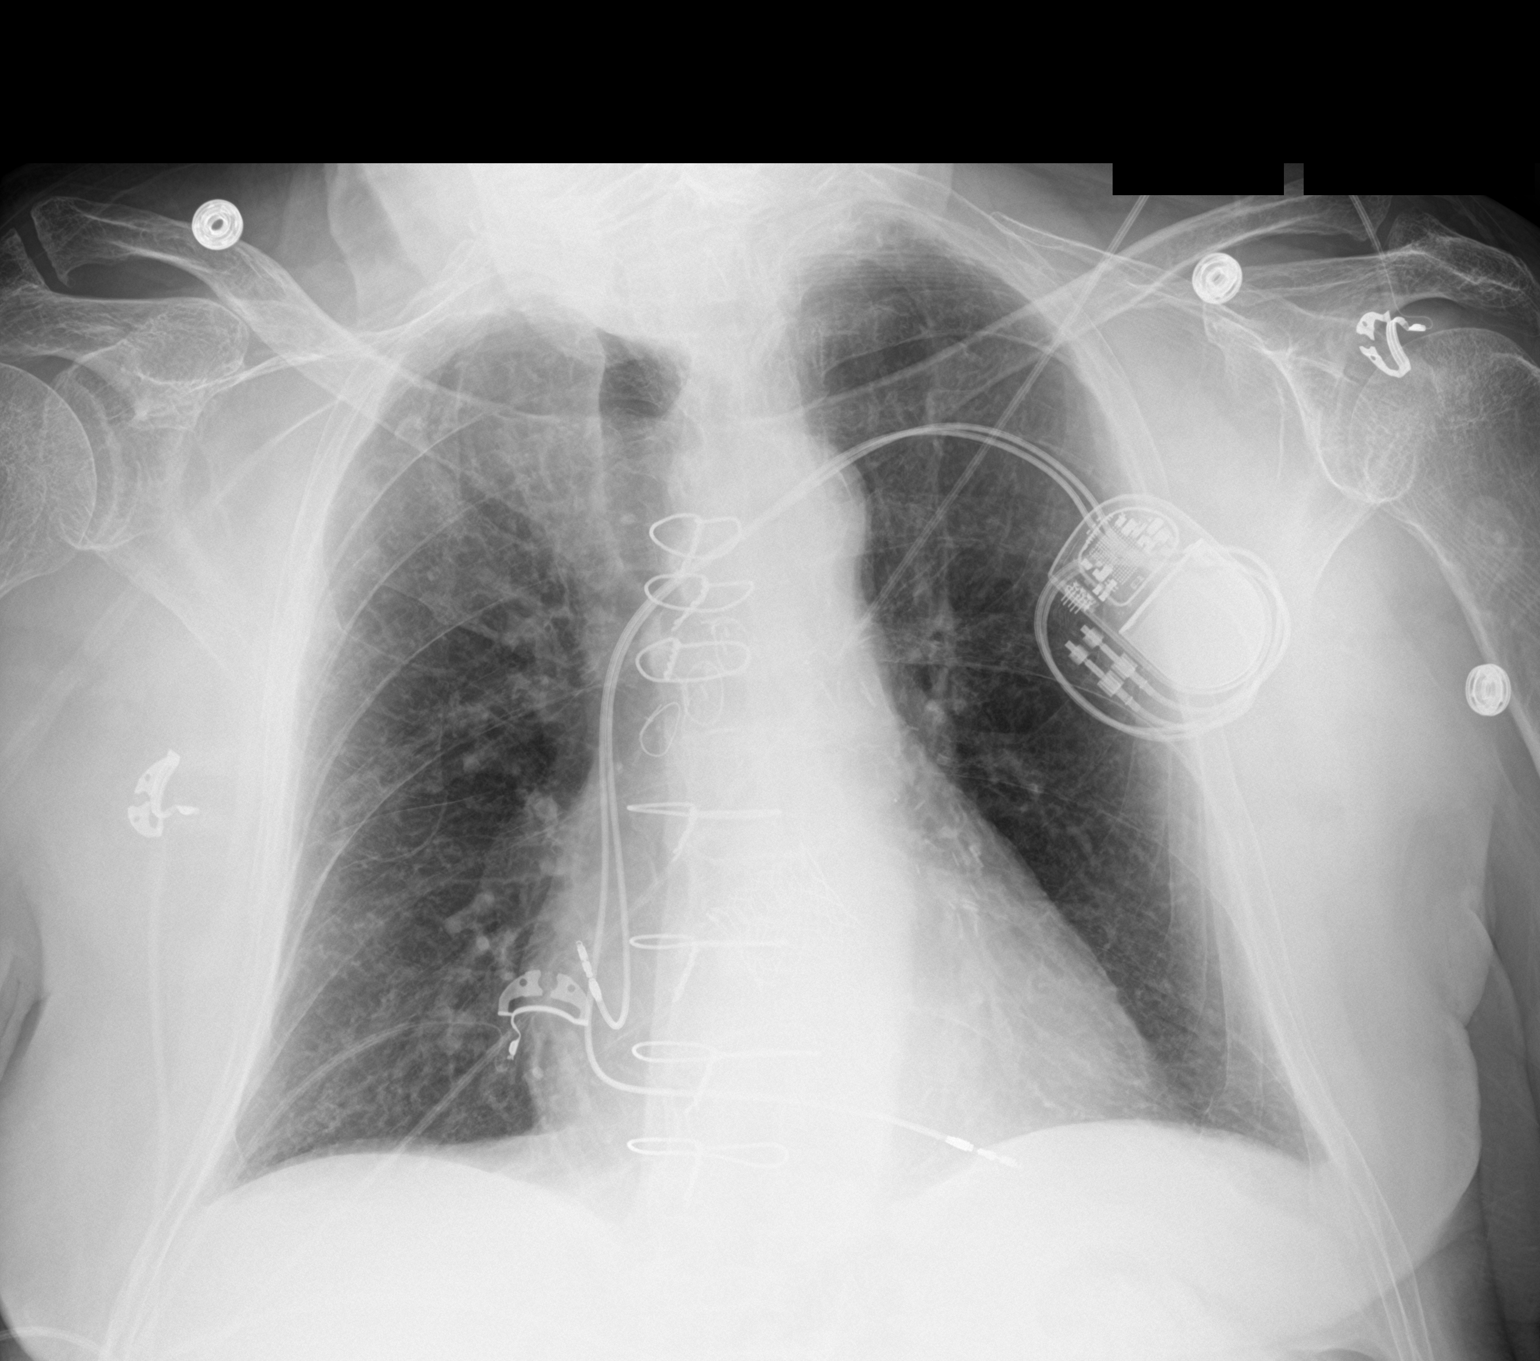

[2 of 2 positions shown; findings below may reference images not displayed]

FINDINGS: Dual lead pacemaker is in place from a left subclavian approach.
Leads are in the region of the right atrium and right ventricle. No
pneumothorax. Right internal jugular venous access sheath removed.
Previous T AVR. Venous hypertension is resolving. Lungs are clear.
No effusions.
IMPRESSION: Good appearance following pacemaker placement.  Resolving edema.

## 2017-02-05 ENCOUNTER — Encounter: Payer: Self-pay | Admitting: Internal Medicine

## 2017-02-05 ENCOUNTER — Ambulatory Visit (INDEPENDENT_AMBULATORY_CARE_PROVIDER_SITE_OTHER): Payer: Medicare Other | Admitting: Internal Medicine

## 2017-02-05 VITALS — BP 122/60 | HR 69 | Ht 70.0 in | Wt 176.2 lb

## 2017-02-05 DIAGNOSIS — I459 Conduction disorder, unspecified: Secondary | ICD-10-CM

## 2017-02-05 LAB — CUP PACEART INCLINIC DEVICE CHECK
Implantable Lead Implant Date: 20171116
Implantable Lead Location: 753859
Implantable Lead Location: 753860
Implantable Lead Model: 377
Implantable Lead Model: 377
Implantable Lead Serial Number: 49591424
Implantable Pulse Generator Implant Date: 20171116
Lead Channel Impedance Value: 526 Ohm
Lead Channel Pacing Threshold Amplitude: 0.5 V
Lead Channel Pacing Threshold Amplitude: 0.5 V
Lead Channel Pacing Threshold Amplitude: 0.9 V
Lead Channel Pacing Threshold Pulse Width: 0.4 ms
Lead Channel Pacing Threshold Pulse Width: 0.4 ms
Lead Channel Pacing Threshold Pulse Width: 0.4 ms
Lead Channel Pacing Threshold Pulse Width: 0.4 ms
Lead Channel Sensing Intrinsic Amplitude: 12.9 mV
Lead Channel Setting Pacing Amplitude: 2 V
Lead Channel Setting Pacing Pulse Width: 0.4 ms
MDC IDC LEAD IMPLANT DT: 20171116
MDC IDC LEAD SERIAL: 49679947
MDC IDC MSMT LEADCHNL RA PACING THRESHOLD AMPLITUDE: 0.8 V
MDC IDC MSMT LEADCHNL RA PACING THRESHOLD AMPLITUDE: 0.9 V
MDC IDC MSMT LEADCHNL RA SENSING INTR AMPL: 5.6 mV
MDC IDC MSMT LEADCHNL RA SENSING INTR AMPL: 6 mV
MDC IDC MSMT LEADCHNL RV IMPEDANCE VALUE: 702 Ohm
MDC IDC MSMT LEADCHNL RV PACING THRESHOLD AMPLITUDE: 0.5 V
MDC IDC MSMT LEADCHNL RV PACING THRESHOLD PULSEWIDTH: 0.4 ms
MDC IDC MSMT LEADCHNL RV PACING THRESHOLD PULSEWIDTH: 0.4 ms
MDC IDC MSMT LEADCHNL RV SENSING INTR AMPL: 13.6 mV
MDC IDC SESS DTM: 20180216091104
MDC IDC SET LEADCHNL RV PACING AMPLITUDE: 2.4 V
MDC IDC STAT BRADY RA PERCENT PACED: 41 %
MDC IDC STAT BRADY RV PERCENT PACED: 48 %
Pulse Gen Model: 407145
Pulse Gen Serial Number: 68853186

## 2017-02-05 NOTE — Patient Instructions (Addendum)
Medication Instructions:  Your physician recommends that you continue on your current medications as directed. Please refer to the Current Medication list given to you today.   Labwork: None Ordered   Testing/Procedures: None Ordered   Follow-Up: Your physician wants you to follow-up in: 9 months with Dr. Ladona Ridgelaylor.  You will receive a reminder letter in the mail two months in advance. If you don't receive a letter, please call our office to schedule the follow-up appointment.  Remote monitoring is used to monitor your Pacemaker from home. This monitoring reduces the number of office visits required to check your device to one time per year. It allows us to keep an eye on the functioning of your device to ensure it is working properly. You are scheduled for a device check from home on 05/10/17. You may send your transmission at any time that day. If you have a wireless device, the transmission will be sent automatically. After your physician reviews your transmission, you will receive a postcard with your next transmission date.    Any Other Special Instructions Will Be Listed Below (If Applicable).     If you need a refill on your cardiac medications before your next appointment, please call your pharmacy.

## 2017-02-05 NOTE — Progress Notes (Signed)
HPI Thomas English presents today after undergoing PPM insertion 3 months ago secondary to CHB after TAVR procedure for severe AS. In the interim he has done well. He denies chest pain, sob, peripheral edema or syncope. His activity has improved since his TAVR.  No Known Allergies   Current Outpatient Prescriptions  Medication Sig Dispense Refill  . amLODipine (NORVASC) 10 MG tablet Take 10 mg by mouth daily with breakfast.   6  . amoxicillin (AMOXIL) 500 MG capsule Take 4 tablets I hour before dental procedure 4 capsule 0  . aspirin EC 81 MG tablet Take 81 mg by mouth daily.    . benazepril (LOTENSIN) 20 MG tablet Take 20 mg by mouth daily with breakfast.   6  . carvedilol (COREG) 6.25 MG tablet Take 6.25 mg by mouth 2 (two) times daily with a meal.    . clopidogrel (PLAVIX) 75 MG tablet Take 75 mg by mouth daily.     Marland Kitchen levothyroxine (SYNTHROID, LEVOTHROID) 88 MCG tablet Take 88 mcg by mouth every evening.   0  . Lutein 6 MG CAPS Take 6 mg by mouth daily with breakfast.     . metFORMIN (GLUCOPHAGE) 500 MG tablet Take 500 mg by mouth daily with breakfast.    . simvastatin (ZOCOR) 20 MG tablet Take 20 mg by mouth every evening.      No current facility-administered medications for this visit.      Past Medical History:  Diagnosis Date  . Aortic stenosis    a. s/p TAVR on 11/03/16  . Arthritis   . CAD (coronary artery disease)    a. CAD s/p CABG 2000   b. cath on 10/05/16 with severe native vessel CAD and patent bypass grafts  . Carotid stenosis   . Diabetes mellitus type 2, noninsulin dependent (HCC)   . Heart block AV second degree    a. 10/2016: after TAVR. s/p Biotrionic PPM: (serial number [id number])   . Hyperlipidemia   . Hypertension   . Hypothyroidism     ROS:   All systems reviewed and negative except as noted in the HPI.   Past Surgical History:  Procedure Laterality Date  . BACK SURGERY    . CARDIAC CATHETERIZATION  11/10/1999   3-vessel disease,  recommendation for CABG  . CARDIAC CATHETERIZATION N/A 10/05/2016   Procedure: Right/Left Heart Cath and Coronary/Graft Angiography;  Surgeon: Runell Gess, MD;  Location: [facility] INVASIVE CV LAB;  Service: Cardiovascular;  Laterality: N/A;  . CORONARY ARTERY BYPASS GRAFT  11/13/1999   LIMA to LAD and a vein graft to the first OM branch, second OM branch, and to the RCA (Dr. Wayland Salinas)  . EP IMPLANTABLE DEVICE N/A 11/05/2016   Procedure: Pacemaker Implant;  Surgeon: Marinus Maw, MD;  Location: [facility] INVASIVE CV LAB;  Service: Cardiovascular;  Laterality: N/A;  . EYE SURGERY     eye duct surgery  . NM MYOCAR PERF WALL MOTION  09/2013   normal lexiscan myoview  . PERIPHERAL VASCULAR CATHETERIZATION N/A 10/05/2016   Procedure: Abdominal Aortogram;  Surgeon: Runell Gess, MD;  Location: [facility] INVASIVE CV LAB;  Service: Cardiovascular;  Laterality: N/A;  . TEE WITHOUT CARDIOVERSION N/A 11/03/2016   Procedure: TRANSESOPHAGEAL ECHOCARDIOGRAM (TEE);  Surgeon: Tonny Bollman, MD;  Location: [facility] OR;  Service: Open Heart Surgery;  Laterality: N/A;  . TRANSCATHETER AORTIC VALVE REPLACEMENT, TRANSFEMORAL N/A 11/03/2016   Procedure: TRANSCATHETER AORTIC VALVE REPLACEMENT, TRANSFEMORAL;  Surgeon: Tonny Bollman, MD;  Location:  [facility] OR;  Service: Open Heart Surgery;  Laterality: N/A;  . TRANSTHORACIC ECHOCARDIOGRAM  12/2013   EF 55-60%, grade 1 diastolic dysfunction, high ventricular filling pressure; severe AV stenosis; calcified MV annulus; LA & RA mildly dilated; atrial septal aneurysm     Family History  Problem Relation Age of Onset  . Lung disease Mother   . Colon cancer Father   . Diabetes Sister   . CAD Sister      Social History   Social History  . Marital status: Married    Spouse name: N/A  . Number of children: 2  . Years of education: GED   Occupational History  . Not on file.   Social History Main Topics  . Smoking status: Former Smoker    Packs/day: 1.00    Years: 5.00     Types: Cigarettes    Quit date: 07/20/1973  . Smokeless tobacco: Never Used  . Alcohol use No  . Drug use: No  . Sexual activity: Not on file   Other Topics Concern  . Not on file   Social History Narrative  . No narrative on file     BP 122/60   Pulse 69   Ht 5\' 10"  (1.778 m)   Wt 176 lb 3.2 oz (79.9 kg)   BMI 25.28 kg/m   Physical Exam:  Well appearing 83 yo man, NAD HEENT: Unremarkable Neck:  6 cm JVD, no thyromegally Lymphatics:  No adenopathy Back:  No CVA tenderness Lungs:  Clear with no wheezes HEART:  Regular rate rhythm, no murmurs, no rubs, no clicks Abd:  soft, positive bowel sounds, no organomegally, no rebound, no guarding Ext:  2 plus pulses, no edema, no cyanosis, no clubbing Skin:  No rashes no nodules Neuro:  CN II through XII intact, motor grossly intact  EKG - nsr with first degree AV block  DEVICE  Normal device function.  See PaceArt for details.   Assess/Plan: 1. CHB after TAVR - his conduction has improved but he is still pacing almost 50% of the time in the ventricle. 2. AS - he is doing well, s/p TAVR 3. HTN heart disease - his blood pressure is well controlled. Will follow. 4. PPM - his Biotronik DDD PM is working normally. Will follow.  Thomas ReevesGregg Mariaha English,M.D.

## 2017-02-10 DIAGNOSIS — E1122 Type 2 diabetes mellitus with diabetic chronic kidney disease: Secondary | ICD-10-CM | POA: Diagnosis not present

## 2017-02-10 DIAGNOSIS — I259 Chronic ischemic heart disease, unspecified: Secondary | ICD-10-CM | POA: Diagnosis not present

## 2017-02-10 DIAGNOSIS — N183 Chronic kidney disease, stage 3 (moderate): Secondary | ICD-10-CM | POA: Diagnosis not present

## 2017-02-10 DIAGNOSIS — I10 Essential (primary) hypertension: Secondary | ICD-10-CM | POA: Diagnosis not present

## 2017-03-10 ENCOUNTER — Encounter: Payer: Self-pay | Admitting: Cardiovascular Disease

## 2017-03-10 ENCOUNTER — Ambulatory Visit (INDEPENDENT_AMBULATORY_CARE_PROVIDER_SITE_OTHER): Payer: Medicare Other | Admitting: Cardiovascular Disease

## 2017-03-10 VITALS — BP 143/69 | HR 66 | Ht 70.5 in | Wt 173.0 lb

## 2017-03-10 DIAGNOSIS — I2510 Atherosclerotic heart disease of native coronary artery without angina pectoris: Secondary | ICD-10-CM | POA: Diagnosis not present

## 2017-03-10 DIAGNOSIS — I350 Nonrheumatic aortic (valve) stenosis: Secondary | ICD-10-CM | POA: Diagnosis not present

## 2017-03-10 DIAGNOSIS — I459 Conduction disorder, unspecified: Secondary | ICD-10-CM | POA: Diagnosis not present

## 2017-03-10 DIAGNOSIS — I10 Essential (primary) hypertension: Secondary | ICD-10-CM

## 2017-03-10 DIAGNOSIS — I6523 Occlusion and stenosis of bilateral carotid arteries: Secondary | ICD-10-CM

## 2017-03-10 DIAGNOSIS — E7800 Pure hypercholesterolemia, unspecified: Secondary | ICD-10-CM

## 2017-03-10 NOTE — Assessment & Plan Note (Signed)
History of coronary artery disease status post bypass grafting in 2000 the LIMA to his LAD, vein to the first obtuse marginal branch, second obtuse marginal branch and to the RCA. He underwent right and left heart cath by myself 10/05/16 revealing the grafts with a 75% insertion stenosis in the RCA vein graft into the PDA. He denies chest pain or shortness of breath.

## 2017-03-10 NOTE — Progress Notes (Signed)
03/10/2017 Thomas English   12-23-1931  [id number]  Primary Physician Thomas Ka, MD Primary Cardiologist: Runell Gess MD Thomas English, [place]  HPI:  The patient is a very pleasant 83 year old, mildly overweight, married Caucasian male, father of 2, grandfather to 1 grandchild who I last saw 09/22/16. He is accompanied by his wife today. He has a history of CAD status post coronary artery bypass grafting in 2000, with a LIMA to his LAD, a vein graft to the first obtuse marginal branch, second obtuse marginal branch and to the RCA. His other problems include hypertension, hyperlipidemia and noninsulin-requiring diabetes. He does have bilateral internal carotid artery stenosis left greater than right which has remained stable over the last 6 months. He is neurologically asymptomatic. He also has mild aortic stenosis with valve area of 1.4 cm squared. His last Myoview performed October 06, 2011, was low risk. He is asymptomatic. Dr. Sheria Lang follows his lipid profile. He has had progressive aortic stenosis with his most recent echo performed 02/28/16 were revealing preserved LV function . He also has moderate bilateral ICA stenosis by duplex ultrasound. When I saw him back in October of last year his dyspnea had progressed as had his aortic stenosis. I performed right and left heart cath on him 10/05/16 revealing patent grafts and he ultimately underwent TAV are Edwards CPE in 3 valve implantation by Drs. 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 Ladona Ridgel this follow-up 2-D echo performed 12/10/16 revealed normal LV function with a well-functioning bioprosthesis. He feels clinically improved, denies shortness of breath and has more energy.  Current Outpatient Prescriptions  Medication Sig Dispense Refill  . amLODipine (NORVASC) 10 MG tablet Take 10 mg by mouth daily with breakfast.   6   . amoxicillin (AMOXIL) 500 MG capsule Take 4 tablets I hour before dental procedure (Patient not taking: Reported on 02/05/2017) 4 capsule 0  . aspirin EC 81 MG tablet Take 81 mg by mouth daily.    . benazepril (LOTENSIN) 20 MG tablet Take 20 mg by mouth daily with breakfast.   6  . carvedilol (COREG) 6.25 MG tablet Take 6.25 mg by mouth 2 (two) times daily with a meal.    . clopidogrel (PLAVIX) 75 MG tablet Take 75 mg by mouth daily.     . Levothyroxine Sodium 100 MCG CAPS Take 100 mcg by mouth every evening.   0  . Lutein 6 MG CAPS Take 6 mg by mouth daily with breakfast.     . metFORMIN (GLUCOPHAGE-XR) 500 MG 24 hr tablet Take 1 tablet by mouth every morning.  1  . simvastatin (ZOCOR) 20 MG tablet Take 20 mg by mouth every evening.      No current facility-administered medications for this visit.     No Known Allergies  Social History   Social History  . Marital status: Married    Spouse name: N/A  . Number of children: 2  . Years of education: GED   Occupational History  . Not on file.   Social History Main Topics  . Smoking status: Former Smoker    Packs/day: 1.00    Years: 5.00    Types: Cigarettes    Quit date: 07/20/1973  . Smokeless tobacco: Never Used  . Alcohol use No  . Drug use: No  . Sexual activity: Not on file   Other Topics Concern  . Not on file  Social History Narrative  . No narrative on file     Review of Systems: General: negative for chills, fever, night sweats or weight changes.  Cardiovascular: negative for chest pain, dyspnea on exertion, edema, orthopnea, palpitations, paroxysmal nocturnal dyspnea or shortness of breath Dermatological: negative for rash Respiratory: negative for cough or wheezing Urologic: negative for hematuria Abdominal: negative for nausea, vomiting, diarrhea, bright red blood per rectum, melena, or hematemesis Neurologic: negative for visual changes, syncope, or dizziness All other systems reviewed and are otherwise  negative except as noted above.    Blood pressure (!) 143/69, pulse 66, height 5' 10.5" (1.791 m), weight 173 lb (78.5 kg), SpO2 100 %.  General appearance: alert and no distress Neck: no adenopathy, no JVD, supple, symmetrical, trachea midline, thyroid not enlarged, symmetric, no tenderness/mass/nodules and Soft right carotid bruit Lungs: clear to auscultation bilaterally Heart: regular rate and rhythm, S1, S2 normal, no murmur, click, rub or gallop Extremities: extremities normal, atraumatic, no cyanosis or edema  EKG not performed today  ASSESSMENT AND PLAN:   Carotid stenosis, bil, last dopplers 12/2013 50-69% History of bilateral internal carotid artery stenosis by duplex ultrasound last checked 03/03/16. This will be rechecked.  Essential hypertension History of essential hypertension with blood pressure measured at 143/69. He is on amlodipine, Lotensin and carvedilol. Continue current meds at current dosing  Hyperlipidemia History of hyperlipidemia on statin therapy followed by his PCP  Coronary artery disease History of coronary artery disease status post bypass grafting in 2000 the LIMA to his LAD, vein to the first obtuse marginal branch, second obtuse marginal branch and to the RCA. He underwent right and left heart cath by myself 10/05/16 revealing the grafts with a 75% insertion stenosis in the RCA vein graft into the PDA. He denies chest pain or shortness of breath.  Severe aortic stenosis History of severe aortic stenosis status post TPA VR by Dr. Rexanne ManoBrian Bartle and Tonny BollmanMichael Cooper on 11/03/16 with an [place] MoleEdwards Sapian 3 26mm valve. He did well postoperatively. The procedure was complicated by transient heart block. His postprocedure echo performed 12/10/16 revealed normal LV systolic function with a well-functioning bioprosthetic valve.  Heart block s/p Biotronic PPM 10/2016 [name] developed transient heart block and left bundle branch block post TAVR implantation  requiring permanent dual-chamber (Biotronik) transvenous pacemaker insertion by Dr. Ladona Ridgelaylor. Office follow-up has shown this to be functioning well      Runell GessJonathan J. Manhattan Mccuen MD [facility] Tomball HospitalFACP,FACC,FAHA, [facility] RockwallFSCAI 03/10/2017 9:39 AM

## 2017-03-10 NOTE — Assessment & Plan Note (Signed)
History of hyperlipidemia on statin therapy followed by his PCP 

## 2017-03-10 NOTE — Assessment & Plan Note (Signed)
[name] developed transient heart block and left bundle branch block post TAVR implantation requiring permanent dual-chamber (Biotronik) transvenous pacemaker insertion by Dr. Ladona Ridgelaylor. Office follow-up has shown this to be functioning well

## 2017-03-10 NOTE — Assessment & Plan Note (Signed)
History of bilateral internal carotid artery stenosis by duplex ultrasound last checked 03/03/16. This will be rechecked.

## 2017-03-10 NOTE — Assessment & Plan Note (Signed)
History of essential hypertension with blood pressure measured at 143/69. He is on amlodipine, Lotensin and carvedilol. Continue current meds at current dosing

## 2017-03-10 NOTE — Assessment & Plan Note (Signed)
History of severe aortic stenosis status post TPA VR by Dr. Rexanne ManoBrian Bartle and Tonny BollmanMichael Cooper on 11/03/16 with an [place] MoleEdwards Sapian 3 26mm valve. He did well postoperatively. The procedure was complicated by transient heart block. His postprocedure echo performed 12/10/16 revealed normal LV systolic function with a well-functioning bioprosthetic valve.

## 2017-03-10 NOTE — Patient Instructions (Signed)
Medication Instructions: Your physician recommends that you continue on your current medications as directed. Please refer to the Current Medication list given to you today.  Labwork: I will request lab work from Dr. Casper HarrisonStreet.  Testing/Procedures: Your physician has requested that you have a carotid duplex. This test is an ultrasound of the carotid arteries in your neck. It looks at blood flow through these arteries that supply the brain with blood. Allow one hour for this exam. There are no restrictions or special instructions.  Follow-Up: Your physician wants you to follow-up in: 6 months with Dr. Allyson SabalBerry. You will receive a reminder letter in the mail two months in advance. If you don't receive a letter, please call our office to schedule the follow-up appointment.  If you need a refill on your cardiac medications before your next appointment, please call your pharmacy.

## 2017-03-16 ENCOUNTER — Other Ambulatory Visit: Payer: Self-pay | Admitting: Cardiovascular Disease

## 2017-03-16 DIAGNOSIS — I6523 Occlusion and stenosis of bilateral carotid arteries: Secondary | ICD-10-CM

## 2017-03-26 ENCOUNTER — Ambulatory Visit (HOSPITAL_COMMUNITY)
Admission: RE | Admit: 2017-03-26 | Discharge: 2017-03-26 | Disposition: A | Discharge status: Home / Self Care | Payer: Medicare Other | Source: Ambulatory Visit | Attending: Cardiology | Admitting: Cardiology

## 2017-03-26 DIAGNOSIS — E119 Type 2 diabetes mellitus without complications: Secondary | ICD-10-CM | POA: Diagnosis not present

## 2017-03-26 DIAGNOSIS — Z951 Presence of aortocoronary bypass graft: Secondary | ICD-10-CM | POA: Diagnosis not present

## 2017-03-26 DIAGNOSIS — E785 Hyperlipidemia, unspecified: Secondary | ICD-10-CM | POA: Diagnosis not present

## 2017-03-26 DIAGNOSIS — I2510 Atherosclerotic heart disease of native coronary artery without angina pectoris: Secondary | ICD-10-CM | POA: Insufficient documentation

## 2017-03-26 DIAGNOSIS — Z87891 Personal history of nicotine dependence: Secondary | ICD-10-CM | POA: Insufficient documentation

## 2017-03-26 DIAGNOSIS — I6523 Occlusion and stenosis of bilateral carotid arteries: Secondary | ICD-10-CM | POA: Insufficient documentation

## 2017-03-29 ENCOUNTER — Other Ambulatory Visit: Payer: Self-pay | Admitting: Cardiovascular Disease

## 2017-03-29 ENCOUNTER — Telehealth: Payer: Self-pay | Admitting: Cardiovascular Disease

## 2017-03-29 DIAGNOSIS — I6523 Occlusion and stenosis of bilateral carotid arteries: Secondary | ICD-10-CM

## 2017-03-29 NOTE — Telephone Encounter (Signed)
VAS US CAROTID  Order: [id number]  Status:  Final result Visible to patient:  No (Not Released) Dx:  Carotid stenosis, bilateral  Notes recorded by Evans Lance on 03/29/2017 at 5:08 PM EDT Repeat order entered.  ------  Notes recorded by Evans Lance on 03/29/2017 at 5:07 PM EDT Results given to pt. Pt verbalized understanding.  ------  Notes recorded by Runell Gess, MD on 03/29/2017 at 2:49 PM EDT Progression of disease on the left. Repeat 12 months

## 2017-04-09 ENCOUNTER — Other Ambulatory Visit: Payer: Self-pay | Admitting: Internal Medicine

## 2017-05-27 DIAGNOSIS — H35371 Puckering of macula, right eye: Secondary | ICD-10-CM | POA: Diagnosis not present

## 2017-07-15 ENCOUNTER — Telehealth: Payer: Self-pay | Admitting: Cardiovascular Disease

## 2017-07-15 NOTE — Telephone Encounter (Signed)
New message    Pt is calling to get the results back for her testing that she did

## 2017-07-19 ENCOUNTER — Ambulatory Visit (INDEPENDENT_AMBULATORY_CARE_PROVIDER_SITE_OTHER): Payer: Medicare Other | Admitting: *Deleted

## 2017-07-19 DIAGNOSIS — I459 Conduction disorder, unspecified: Secondary | ICD-10-CM

## 2017-07-20 NOTE — Progress Notes (Signed)
Remote pacemaker transmission.   

## 2017-07-23 ENCOUNTER — Encounter: Payer: Self-pay | Admitting: Cardiology

## 2017-08-13 DIAGNOSIS — I10 Essential (primary) hypertension: Secondary | ICD-10-CM | POA: Diagnosis not present

## 2017-08-13 DIAGNOSIS — E785 Hyperlipidemia, unspecified: Secondary | ICD-10-CM | POA: Diagnosis not present

## 2017-08-13 DIAGNOSIS — N183 Chronic kidney disease, stage 3 (moderate): Secondary | ICD-10-CM | POA: Diagnosis not present

## 2017-08-13 DIAGNOSIS — E1122 Type 2 diabetes mellitus with diabetic chronic kidney disease: Secondary | ICD-10-CM | POA: Diagnosis not present

## 2017-08-24 LAB — CUP PACEART REMOTE DEVICE CHECK
Brady Statistic AS VP Percent: 18 %
Brady Statistic RA Percent Paced: 34 %
Implantable Lead Location: 753859
Implantable Lead Model: 377
Implantable Lead Serial Number: 49591424
Implantable Pulse Generator Implant Date: 20171116
Lead Channel Pacing Threshold Amplitude: 0.9 V
Lead Channel Pacing Threshold Pulse Width: 0.4 ms
Lead Channel Setting Pacing Amplitude: 2.4 V
Lead Channel Setting Pacing Pulse Width: 0.4 ms
MDC IDC LEAD IMPLANT DT: 20171116
MDC IDC LEAD IMPLANT DT: 20171116
MDC IDC LEAD LOCATION: 753860
MDC IDC LEAD SERIAL: 49679947
MDC IDC MSMT LEADCHNL RA IMPEDANCE VALUE: 501 Ohm
MDC IDC MSMT LEADCHNL RA PACING THRESHOLD PULSEWIDTH: 0.4 ms
MDC IDC MSMT LEADCHNL RV IMPEDANCE VALUE: 655 Ohm
MDC IDC MSMT LEADCHNL RV PACING THRESHOLD AMPLITUDE: 0.5 V
MDC IDC SESS DTM: 20180904045908
MDC IDC SET LEADCHNL RA PACING AMPLITUDE: 2 V
MDC IDC STAT BRADY AP VP PERCENT: 32 %
MDC IDC STAT BRADY AP VS PERCENT: 1 %
MDC IDC STAT BRADY AS VS PERCENT: 49 %
MDC IDC STAT BRADY RV PERCENT PACED: 50 %
Pulse Gen Model: 407145
Pulse Gen Serial Number: 68853186

## 2017-09-07 ENCOUNTER — Other Ambulatory Visit: Payer: Self-pay | Admitting: Cardiovascular Disease

## 2017-09-07 ENCOUNTER — Encounter: Payer: Self-pay | Admitting: Cardiovascular Disease

## 2017-09-07 ENCOUNTER — Ambulatory Visit (INDEPENDENT_AMBULATORY_CARE_PROVIDER_SITE_OTHER): Payer: Medicare Other | Admitting: Cardiovascular Disease

## 2017-09-07 VITALS — BP 158/71 | HR 66 | Ht 70.0 in | Wt 167.0 lb

## 2017-09-07 DIAGNOSIS — I350 Nonrheumatic aortic (valve) stenosis: Secondary | ICD-10-CM

## 2017-09-07 DIAGNOSIS — I6523 Occlusion and stenosis of bilateral carotid arteries: Secondary | ICD-10-CM

## 2017-09-07 DIAGNOSIS — Z952 Presence of prosthetic heart valve: Secondary | ICD-10-CM

## 2017-09-07 DIAGNOSIS — I10 Essential (primary) hypertension: Secondary | ICD-10-CM | POA: Diagnosis not present

## 2017-09-07 DIAGNOSIS — I459 Conduction disorder, unspecified: Secondary | ICD-10-CM

## 2017-09-07 DIAGNOSIS — E7800 Pure hypercholesterolemia, unspecified: Secondary | ICD-10-CM | POA: Diagnosis not present

## 2017-09-07 DIAGNOSIS — I2510 Atherosclerotic heart disease of native coronary artery without angina pectoris: Secondary | ICD-10-CM

## 2017-09-07 NOTE — Assessment & Plan Note (Signed)
History of coronary artery disease status post bypass grafting in 2000 with LIMA to his LAD, vein graft to first obtuse marginal branch, separate second obtuse marginal branch and to the RCA. I performed a right left heart cath on him 10/05/16 revealed patent grafts and severe aortic stenosis. He denies chest pain or shortness of breath.

## 2017-09-07 NOTE — Assessment & Plan Note (Signed)
History of essential hypertension blood pressure was 158/71. He is on amlodipine, Lotensin and carvedilol. Continue current meds at current dosing

## 2017-09-07 NOTE — Assessment & Plan Note (Signed)
History of hyperlipidemia on statin therapy followed by PCP 

## 2017-09-07 NOTE — Progress Notes (Signed)
09/07/2017 Thomas English   08/05/1932  [id number]  Primary Physician Street, Stephanie Coup, MD Primary Cardiologist: Runell Gess MD FACP, [place], [place], [place]  HPI:  Thomas English is a 83 y.o. male, mildly overweight, married Caucasian male, father of 2, grandfather to 1 grandchild who I last saw 03/10/17. He is accompanied by his wife today. He has a history of CAD status post coronary artery bypass grafting in 2000, with a LIMA to his LAD, a vein graft to the first obtuse marginal branch, second obtuse marginal branch and to the RCA. His other problems include hypertension, hyperlipidemia and noninsulin-requiring diabetes. He does have bilateral internal carotid artery stenosis left greater than right which has remained stable over the last 6 months. He is neurologically asymptomatic. He also has mild aortic stenosis with valve area of 1.4 cm squared. His last Myoview performed October 06, 2011, was low risk. He is asymptomatic. Dr. Sheria Lang follows his lipid profile. He has had progressive aortic stenosis with his most recent echo performed 02/28/16 were revealing preserved LV function . He also has moderate bilateral ICA stenosis by duplex ultrasound. When I saw him back in October of last year his dyspnea had progressed as had his aortic stenosis. I performed right and left heart cath on him 10/05/16 revealing patent grafts and he ultimately underwent TAV are Edwards CPE in 3 valve implantation by Drs. 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 Ladona Ridgel this follow-up 2-D echo performed 12/10/16 revealed normal LV function with a well-functioning bioprosthesis. He feels clinically improved, denies shortness of breath and has more energy. Since I saw him 6  months ago he's remained stable. He is completely asymptomatic. Carotid Dopplers performed 03/26/17 revealed stable but moderate bilateral  ICA stenosis.   Current Meds  Medication Sig  . amLODipine (NORVASC) 10 MG tablet Take 10 mg by mouth daily with breakfast.   . amoxicillin (AMOXIL) 500 MG capsule Take 4 tablets I hour before dental procedure  . aspirin EC 81 MG tablet Take 81 mg by mouth daily.  . benazepril (LOTENSIN) 20 MG tablet Take 20 mg by mouth daily with breakfast.   . carvedilol (COREG) 6.25 MG tablet Take 6.25 mg by mouth 2 (two) times daily with a meal.  . clopidogrel (PLAVIX) 75 MG tablet Take 75 mg by mouth daily.   . Levothyroxine Sodium 100 MCG CAPS Take 100 mcg by mouth every evening.   . Lutein 6 MG CAPS Take 6 mg by mouth daily with breakfast.   . metFORMIN (GLUCOPHAGE-XR) 500 MG 24 hr tablet Take 1 tablet by mouth every morning.  . simvastatin (ZOCOR) 20 MG tablet Take 20 mg by mouth every evening.      No Known Allergies  Social History   Social History  . Marital status: Married    Spouse name: N/A  . Number of children: 2  . Years of education: GED   Occupational History  . Not on file.   Social History Main Topics  . Smoking status: Former Smoker    Packs/day: 1.00    Years: 5.00    Types: Cigarettes    Quit date: 07/20/1973  . Smokeless tobacco: Never Used  . Alcohol use No  . Drug use: No  . Sexual activity: Not on file   Other Topics Concern  . Not on file   Social History Narrative  . No narrative on file  Review of Systems: General: negative for chills, fever, night sweats or weight changes.  Cardiovascular: negative for chest pain, dyspnea on exertion, edema, orthopnea, palpitations, paroxysmal nocturnal dyspnea or shortness of breath Dermatological: negative for rash Respiratory: negative for cough or wheezing Urologic: negative for hematuria Abdominal: negative for nausea, vomiting, diarrhea, bright red blood per rectum, melena, or hematemesis Neurologic: negative for visual changes, syncope, or dizziness All other systems reviewed and are otherwise negative  except as noted above.    Blood pressure (!) 158/71, pulse 66, height  (1.778 m), weight 167 lb (75.8 kg).  General appearance: alert and no distress Neck: no adenopathy, no JVD, supple, symmetrical, trachea midline, thyroid not enlarged, symmetric, no tenderness/mass/nodules and Soft outflow tract murmur Lungs: no adenopathy, no JVD, supple, symmetrical, trachea midline, thyroid not enlarged, symmetric, no tenderness/mass/nodules and Soft right carotid bruit Heart: regular rate and rhythm, S1, S2 normal, no murmur, click, rub or gallop Extremities: extremities normal, atraumatic, no cyanosis or edema Pulses: 2+ and symmetric Skin: Skin color, texture, turgor normal. No rashes or lesions Neurologic: Alert and oriented X 3, normal strength and tone. Normal symmetric reflexes. Normal coordination and gait  EKG sinus rhythm at 66 with nonspecific ST and T-wave changes. I personally reviewed this EKG.  ASSESSMENT AND PLAN:   Carotid stenosis, bil, last dopplers 12/2013 50-69% History of moderate bilateral ICA stenosis by duplex ultrasound/6/18. Neurologically symptomatic. We'll follow this on an annual basis.  Essential hypertension History of essential hypertension blood pressure was 158/71. He is on amlodipine, Lotensin and carvedilol. Continue current meds at current dosing  Hyperlipidemia History of hyperlipidemia on statin therapy followed by PCP  Coronary artery disease History of coronary artery disease status post bypass grafting in 2000 with LIMA to his LAD, vein graft to first obtuse marginal branch, separate second obtuse marginal branch and to the RCA. I performed a right left heart cath on him 10/05/16 revealed patent grafts and severe aortic stenosis. He denies chest pain or shortness of breath.  Severe aortic stenosis History of severe aortic stenosis status post T aVR Drs. Cooper and vital 11/03/16 with a Edwards Sapian 3 valve. His postprocedure echo performed  12/10/16 revealed normal LV function with a well-functioning bioprosthesis in the aortic position. We will continue to follow this on an annual basis.  Heart block s/p Biotronic PPM 10/2016 Transient bundle branch block and heart block post TAVR implantation with implantation of a permanent transvenous pacemaker by Dr. Ladona Ridgel which she follows on a routine basis.      Runell Gess MD FACP,FACC,FAHA, [facility] 09/07/2017 9:42 AM

## 2017-09-07 NOTE — Assessment & Plan Note (Signed)
History of severe aortic stenosis status post T aVR Drs. Cooper and vital 11/03/16 with a Edwards Sapian 3 valve. His postprocedure echo performed 12/10/16 revealed normal LV function with a well-functioning bioprosthesis in the aortic position. We will continue to follow this on an annual basis.

## 2017-09-07 NOTE — Assessment & Plan Note (Signed)
History of moderate bilateral ICA stenosis by duplex ultrasound/6/18. Neurologically symptomatic. We'll follow this on an annual basis.

## 2017-09-07 NOTE — Assessment & Plan Note (Signed)
Transient bundle branch block and heart block post TAVR implantation with implantation of a permanent transvenous pacemaker by Dr. Ladona Ridgel which she follows on a routine basis.

## 2017-09-07 NOTE — Patient Instructions (Signed)
Medication Instructions: Your physician recommends that you continue on your current medications as directed. Please refer to the Current Medication list given to you today.   Testing/Procedures: Your physician has requested that you have an echocardiogram in December in our [facility]. Echocardiography is a painless test that uses sound waves to create images of your heart. It provides your doctor with information about the size and shape of your heart and how well your heart's chambers and valves are working. This procedure takes approximately one hour. There are no restrictions for this procedure.  Your physician has requested that you have a carotid duplex in April 2019. This test is an ultrasound of the carotid arteries in your neck. It looks at blood flow through these arteries that supply the brain with blood. Allow one hour for this exam. There are no restrictions or special instructions.  Follow-Up: Your physician wants you to follow-up in: 1 year with Dr. Allyson Sabal. You will receive a reminder letter in the mail two months in advance. If you don't receive a letter, please call our office to schedule the follow-up appointment.  If you need a refill on your cardiac medications before your next appointment, please call your pharmacy.

## 2017-09-13 ENCOUNTER — Telehealth: Payer: Self-pay | Admitting: Internal Medicine

## 2017-09-13 NOTE — Telephone Encounter (Signed)
Thomas English is calling to find if Thomas English is gone for 3 nights does he have to take the monitor with him . If the check is done remotely . Please call

## 2017-09-13 NOTE — Telephone Encounter (Signed)
Spoke with Thomas English and informed her that it was ok for Thomas English to go out of town for 3 days and not take his monitor with him. She voiced understanding

## 2017-09-15 DIAGNOSIS — Z23 Encounter for immunization: Secondary | ICD-10-CM | POA: Diagnosis not present

## 2017-10-18 ENCOUNTER — Ambulatory Visit (INDEPENDENT_AMBULATORY_CARE_PROVIDER_SITE_OTHER): Payer: Medicare Other | Admitting: *Deleted

## 2017-10-18 DIAGNOSIS — I459 Conduction disorder, unspecified: Secondary | ICD-10-CM | POA: Diagnosis not present

## 2017-10-19 NOTE — Progress Notes (Signed)
Remote pacemaker transmission.   

## 2017-10-26 ENCOUNTER — Encounter: Payer: Self-pay | Admitting: Cardiology

## 2017-10-26 LAB — CUP PACEART REMOTE DEVICE CHECK
Date Time Interrogation Session: 20181106140546
Implantable Lead Implant Date: 20171116
Implantable Lead Location: 753859
Implantable Lead Serial Number: 49679947
MDC IDC LEAD IMPLANT DT: 20171116
MDC IDC LEAD LOCATION: 753860
MDC IDC LEAD SERIAL: 49591424
MDC IDC PG IMPLANT DT: 20171116
Pulse Gen Model: 407145
Pulse Gen Serial Number: 68853186

## 2017-11-02 ENCOUNTER — Encounter: Payer: Self-pay | Admitting: Internal Medicine

## 2017-11-02 ENCOUNTER — Ambulatory Visit: Payer: Medicare Other | Admitting: Internal Medicine

## 2017-11-02 VITALS — BP 142/66 | HR 66 | Ht 70.0 in | Wt 170.0 lb

## 2017-11-02 DIAGNOSIS — I10 Essential (primary) hypertension: Secondary | ICD-10-CM | POA: Diagnosis not present

## 2017-11-02 DIAGNOSIS — I459 Conduction disorder, unspecified: Secondary | ICD-10-CM | POA: Diagnosis not present

## 2017-11-02 DIAGNOSIS — Z952 Presence of prosthetic heart valve: Secondary | ICD-10-CM | POA: Diagnosis not present

## 2017-11-02 LAB — CUP PACEART INCLINIC DEVICE CHECK
Implantable Lead Implant Date: 20171116
Implantable Lead Location: 753859
Implantable Lead Location: 753860
Implantable Lead Model: 377
Implantable Lead Model: 377
Implantable Lead Serial Number: 49591424
Implantable Pulse Generator Implant Date: 20171116
Lead Channel Impedance Value: 721 Ohm
Lead Channel Pacing Threshold Amplitude: 0.6 V
Lead Channel Pacing Threshold Amplitude: 0.6 V
Lead Channel Pacing Threshold Amplitude: 0.6 V
Lead Channel Pacing Threshold Amplitude: 0.7 V
Lead Channel Pacing Threshold Amplitude: 0.8 V
Lead Channel Pacing Threshold Pulse Width: 0.4 ms
Lead Channel Pacing Threshold Pulse Width: 0.4 ms
Lead Channel Pacing Threshold Pulse Width: 0.4 ms
Lead Channel Setting Pacing Amplitude: 2 V
Lead Channel Setting Pacing Amplitude: 2.4 V
Lead Channel Setting Pacing Pulse Width: 0.4 ms
MDC IDC LEAD IMPLANT DT: 20171116
MDC IDC LEAD SERIAL: 49679947
MDC IDC MSMT LEADCHNL RA IMPEDANCE VALUE: 468 Ohm
MDC IDC MSMT LEADCHNL RA PACING THRESHOLD AMPLITUDE: 0.7 V
MDC IDC MSMT LEADCHNL RA PACING THRESHOLD PULSEWIDTH: 0.4 ms
MDC IDC MSMT LEADCHNL RA PACING THRESHOLD PULSEWIDTH: 0.4 ms
MDC IDC MSMT LEADCHNL RA SENSING INTR AMPL: 5.8 mV
MDC IDC MSMT LEADCHNL RA SENSING INTR AMPL: 6 mV
MDC IDC MSMT LEADCHNL RV PACING THRESHOLD PULSEWIDTH: 0.4 ms
MDC IDC MSMT LEADCHNL RV SENSING INTR AMPL: 12.6 mV
MDC IDC MSMT LEADCHNL RV SENSING INTR AMPL: 12.8 mV
MDC IDC SESS DTM: 20181113120824
Pulse Gen Model: 407145
Pulse Gen Serial Number: 68853186

## 2017-11-02 NOTE — Progress Notes (Signed)
HPI [name] returns today for followup. He is a pleasant 83 yo man with CHB, s/p Biotronik DDD PM after undergoing TAVR. He has done well in the interim. No chest pain or sob. No syncope. No edema.  No Known Allergies   Current Outpatient Medications  Medication Sig Dispense Refill  . amLODipine (NORVASC) 10 MG tablet Take 10 mg by mouth daily with breakfast.   6  . amoxicillin (AMOXIL) 500 MG capsule Take 4 tablets I hour before dental procedure 4 capsule 0  . aspirin EC 81 MG tablet Take 81 mg by mouth daily.    . benazepril (LOTENSIN) 20 MG tablet Take 20 mg by mouth daily with breakfast.   6  . carvedilol (COREG) 6.25 MG tablet Take 6.25 mg by mouth 2 (two) times daily with a meal.    . clopidogrel (PLAVIX) 75 MG tablet Take 75 mg by mouth daily.     . Levothyroxine Sodium 100 MCG CAPS Take 100 mcg by mouth every evening.   0  . Lutein 6 MG CAPS Take 6 mg by mouth daily with breakfast.     . metFORMIN (GLUCOPHAGE-XR) 500 MG 24 hr tablet Take 1 tablet by mouth every morning.  1  . simvastatin (ZOCOR) 20 MG tablet Take 20 mg by mouth every evening.      No current facility-administered medications for this visit.      Past Medical History:  Diagnosis Date  . Aortic stenosis    a. s/p TAVR on 11/03/16  . Arthritis   . CAD (coronary artery disease)    a. CAD s/p CABG 2000   b. cath on 10/05/16 with severe native vessel CAD and patent bypass grafts  . Carotid stenosis   . Diabetes mellitus type 2, noninsulin dependent (HCC)   . Heart block AV second degree    a. 10/2016: after TAVR. s/p Biotrionic PPM: (serial number [id number])   . Hyperlipidemia   . Hypertension   . Hypothyroidism     ROS:   All systems reviewed and negative except as noted in the HPI.   Past Surgical History:  Procedure Laterality Date  . BACK SURGERY    . CARDIAC CATHETERIZATION  11/10/1999   3-vessel disease, recommendation for CABG  . CORONARY ARTERY BYPASS GRAFT  11/13/1999   LIMA to  LAD and a vein graft to the first OM branch, second OM branch, and to the RCA (Dr. Wayland SalinasB. Bartle)  . EYE SURGERY     eye duct surgery  . NM MYOCAR PERF WALL MOTION  09/2013   normal lexiscan myoview  . TRANSTHORACIC ECHOCARDIOGRAM  12/2013   EF 55-60%, grade 1 diastolic dysfunction, high ventricular filling pressure; severe AV stenosis; calcified MV annulus; LA & RA mildly dilated; atrial septal aneurysm     Family History  Problem Relation Age of Onset  . Lung disease Mother   . Colon cancer Father   . Diabetes Sister   . CAD Sister      Social History   Socioeconomic History  . Marital status: Married    Spouse name: Not on file  . Number of children: 2  . Years of education: GED  . Highest education level: Not on file  Social Needs  . Financial resource strain: Not on file  . Food insecurity - worry: Not on file  . Food insecurity - inability: Not on file  . Transportation needs - medical: Not on file  . Transportation needs -  non-medical: Not on file  Occupational History  . Not on file  Tobacco Use  . Smoking status: Former Smoker    Packs/day: 1.00    Years: 5.00    Pack years: 5.00    Types: Cigarettes    Last attempt to quit: 07/20/1973    Years since quitting: 44.3  . Smokeless tobacco: Never Used  Substance and Sexual Activity  . Alcohol use: No  . Drug use: No  . Sexual activity: Not on file  Other Topics Concern  . Not on file  Social History Narrative  . Not on file     BP (!) 142/66   Pulse 66   Ht 5\' 10"  (1.778 m)   Wt 170 lb (77.1 kg)   SpO2 98%   BMI 24.39 kg/m   Physical Exam:  Well appearing 83 yo man, NAD HEENT: Unremarkable Neck:  6 cm JVD, no thyromegally Lymphatics:  No adenopathy Back:  No CVA tenderness Lungs:  Clear with no wheezes HEART:  Regular rate rhythm, no murmurs, no rubs, no clicks Abd:  soft, positive bowel sounds, no organomegally, no rebound, no guarding Ext:  2 plus pulses, no edema, no cyanosis, no  clubbing Skin:  No rashes no nodules Neuro:  CN II through XII intact, motor grossly intact   DEVICE  Normal device function.  See PaceArt for details.   Assess/Plan: 1. CHB - he is doing well after his PPM. Will recheck in severa months. 2. HTN - his blood pressure is elevated minimally. He is encouraged to avoid salty foods.  3. AS - he is asymptomatic s/p TAVR. Continue Plavix. 4. PPM - his Biotronik DDD PM is working normally. Will recheck in several months.  Lewayne BuntingGregg Bellamie Turney,

## 2017-11-02 NOTE — Patient Instructions (Signed)

## 2017-11-25 ENCOUNTER — Encounter: Payer: Self-pay | Admitting: *Deleted

## 2017-11-26 ENCOUNTER — Encounter: Payer: Self-pay | Admitting: Cardiovascular Disease

## 2017-11-26 DIAGNOSIS — E119 Type 2 diabetes mellitus without complications: Secondary | ICD-10-CM | POA: Diagnosis not present

## 2017-11-26 DIAGNOSIS — H35371 Puckering of macula, right eye: Secondary | ICD-10-CM | POA: Diagnosis not present

## 2017-11-30 NOTE — Progress Notes (Signed)
[facility]   MULTIDISCIPLINARY HEART VALVE CLINIC                                       Cardiology Office Note    Date:  12/01/2017   ID:  Thomas English, DOB 1932-10-03, MRN [id number]  PCP:  Street, Stephanie Coup, MD  Cardiologist:  Dr. Allyson Sabal / Dr. Ladona Ridgel (EP) / Dr. Excell Seltzer & Dr. Laneta Simmers (TAVR)  CC: 1 year follow up s/p TAVR   History of Present Illness:  Thomas English is a 83 y.o. male with a history of CAD s/p CABG (2000), HTN, HLD, DMT2, and severe AS s/p TAVR (10/2016) c/b new LBBB and high grade heart block s/p PPM who presents to clinic for 1 year follow up.   The patient underwent TAVR with a 26 mm Edwards Sapien 3 valve via a percutaneous transfemoral approach 11/03/2016 for treatment of Stage D, severe aortic stenosis. His post-operative course was complicated by heart block requiring permanent pacemaker placement.  He was seen by Dr. Excell Seltzer for 1 month follow up in 11/2016 and doing quite well. Echo at that time showed normal LV function and a properly functioning heart valve with a mean gradient of 7 mm Hg and peak gradient of 13 mm Hg.   Today he presents to clinic for follow up. No CP or SOB. No LE edema, orthopnea or PND. No dizziness or syncope. No blood in stool or urine. No palpitations. He remains very active and goes out walking every morning. 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 He has had no issues with doing any of this. He can walk up a flight of stairs with no issues.   Past Medical History:  Diagnosis Date  . Aortic stenosis    a. s/p TAVR on 11/03/16  . Arthritis   . CAD (coronary artery disease)    a. CAD s/p CABG 2000   b. cath on 10/05/16 with severe native vessel CAD and patent bypass grafts  . Carotid stenosis   . Diabetes mellitus type 2, noninsulin dependent (HCC)   . Heart block AV second degree    a. 10/2016: after TAVR. s/p Biotrionic PPM: (serial number [id number])   . Hyperlipidemia   .  Hypertension   . Hypothyroidism     Past Surgical History:  Procedure Laterality Date  . BACK SURGERY    . CARDIAC CATHETERIZATION  11/10/1999   3-vessel disease, recommendation for CABG  . CARDIAC CATHETERIZATION N/A 10/05/2016   Procedure: Right/Left Heart Cath and Coronary/Graft Angiography;  Surgeon: Runell Gess, MD;  Location: [facility] INVASIVE CV LAB;  Service: Cardiovascular;  Laterality: N/A;  . CORONARY ARTERY BYPASS GRAFT  11/13/1999   LIMA to LAD and a vein graft to the first OM branch, second OM branch, and to the RCA (Dr. Wayland Salinas)  . EP IMPLANTABLE DEVICE N/A 11/05/2016   Procedure: Pacemaker Implant;  Surgeon: Marinus Maw, MD;  Location: [facility] INVASIVE CV LAB;  Service: Cardiovascular;  Laterality: N/A;  . EYE SURGERY     eye duct surgery  . NM MYOCAR PERF WALL MOTION  09/2013   normal lexiscan myoview  . PERIPHERAL VASCULAR CATHETERIZATION N/A 10/05/2016   Procedure: Abdominal Aortogram;  Surgeon: Runell Gess, MD;  Location: [facility] INVASIVE CV LAB;  Service: Cardiovascular;  Laterality: N/A;  . TEE WITHOUT  CARDIOVERSION N/A 11/03/2016   Procedure: TRANSESOPHAGEAL ECHOCARDIOGRAM (TEE);  Surgeon: Tonny BollmanMichael Cooper, MD;  [facility];  Service: Open Heart Surgery;  Laterality: N/A;  . TRANSCATHETER AORTIC VALVE REPLACEMENT, TRANSFEMORAL N/A 11/03/2016   Procedure: TRANSCATHETER AORTIC VALVE REPLACEMENT, TRANSFEMORAL;  Surgeon: Tonny BollmanMichael Cooper, MD;  [facility];  Service: Open Heart Surgery;  Laterality: N/A;  . TRANSTHORACIC ECHOCARDIOGRAM  12/2013   EF 55-60%, grade 1 diastolic dysfunction, high ventricular filling pressure; severe AV stenosis; calcified MV annulus; LA & RA mildly dilated; atrial septal aneurysm    Current Medications: Outpatient Medications Prior to Visit  Medication Sig Dispense Refill  . amLODipine (NORVASC) 10 MG tablet Take 10 mg by mouth daily with breakfast.   6  . amoxicillin (AMOXIL) 500 MG capsule Take 4 tablets I hour before dental  procedure 4 capsule 0  . benazepril (LOTENSIN) 20 MG tablet Take 20 mg by mouth daily with breakfast.   6  . carvedilol (COREG) 6.25 MG tablet Take 6.25 mg by mouth 2 (two) times daily with a meal.    . clopidogrel (PLAVIX) 75 MG tablet Take 75 mg by mouth daily.     . Levothyroxine Sodium 100 MCG CAPS Take 100 mcg by mouth every evening.   0  . Lutein 6 MG CAPS Take 6 mg by mouth daily with breakfast.     . metFORMIN (GLUCOPHAGE-XR) 500 MG 24 hr tablet Take 1 tablet by mouth every morning.  1  . simvastatin (ZOCOR) 20 MG tablet Take 20 mg by mouth every evening.     Marland Kitchen. aspirin EC 81 MG tablet Take 81 mg by mouth daily.     No facility-administered medications prior to visit.      Allergies:   Patient has no known allergies.   Social History   Socioeconomic History  . Marital status: Married    Spouse name: None  . Number of children: 2  . Years of education: GED  . Highest education level: None  Social Needs  . Financial resource strain: None  . Food insecurity - worry: None  . Food insecurity - inability: None  . Transportation needs - medical: None  . Transportation needs - non-medical: None  Occupational History  . None  Tobacco Use  . Smoking status: Former Smoker    Packs/day: 1.00    Years: 5.00    Pack years: 5.00    Types: Cigarettes    Last attempt to quit: 07/20/1973    Years since quitting: 44.3  . Smokeless tobacco: Never Used  Substance and Sexual Activity  . Alcohol use: No  . Drug use: No  . Sexual activity: None  Other Topics Concern  . None  Social History Narrative  . None     Family History:  The patient's family history includes CAD in his sister; Colon cancer in his father; Diabetes in his mother and sister; Lung disease in his mother.      ROS:   Please see the history of present illness.    ROS All other systems reviewed and are negative.   PHYSICAL EXAM:   VS:  BP (!) 134/58   Pulse 60   Ht 5\' 10"  (1.778 m)   Wt 169 lb 12.8 oz (77  kg)   SpO2 98%   BMI 24.36 kg/m    GEN: Well nourished, well developed, in no acute distress  HEENT: normal  Neck: no JVD, carotid bruits, or masses Cardiac: RRR; soft SEM @ RUSB. No rubs, or  gallops,no edema  Respiratory:  clear to auscultation bilaterally, normal work of breathing GI: soft, nontender, nondistended, + BS MS: no deformity or atrophy  Skin: warm and dry, no rash Neuro:  Alert and Oriented x 3, Strength and sensation are intact Psych: euthymic mood, full affect    Wt Readings from Last 3 Encounters:  12/01/17 169 lb 12.8 oz (77 kg)  11/02/17 170 lb (77.1 kg)  09/07/17 167 lb (75.8 kg)     Studies/Labs Reviewed:   EKG:  EKG is NOT ordered today.    Recent Labs: No results found for requested labs within last 8760 hours.   Lipid Panel No results found for: CHOL, TRIG, HDL, CHOLHDL, VLDL, LDLCALC, LDLDIRECT  Additional studies/ records that were reviewed today include:  2D ECHO 12/12/17 (1 month s/p TAVR) Study Conclusions - Left ventricle: The cavity size was normal. Wall thickness was   increased in a pattern of mild LVH. Systolic function was   vigorous. The estimated ejection fraction was in the range of 65%   to 70%. Wall motion was normal; there were no regional wall   motion abnormalities. Doppler parameters are consistent with   abnormal left ventricular relaxation (grade 1 diastolic   dysfunction). The E/e&' ratio is between 8-15, suggesting   indeterminate LV filling pressure. - Aortic valve: TAVR bioprosthetic valve. No obstruction or   paravalvular leak. Mean gradient (S): 7 mm Hg. Peak gradient (S):   13 mm Hg. - Mitral valve: Calcified annulus. Mildly thickened leaflets .   There was trivial regurgitation. - Left atrium: The atrium was normal in size. - Right ventricle: The cavity size was normal. Wall thickness was   normal. Pacer wire or catheter noted in right ventricle. Systolic   function was normal. - Right atrium: The atrium was  normal in size. Pacer wire or   catheter noted in right atrium. - Pulmonic valve: There was trivial regurgitation. - Pulmonary arteries: PA peak pressure: 32 mm Hg (S). - Inferior vena cava: The vessel was normal in size. The   respirophasic diameter changes were in the normal range (= 50%),   consistent with normal central venous pressure. - Pericardium, extracardiac: There was no pericardial effusion. Impressions: - Compared to a prior study in 10/2016, the LVEF is higher at   65-70%. The TAVR bioprosthetic gradient is stable.  2D ECHO 12/01/17 (1 year s/p TAVR) Study Conclusions - Left ventricle: The cavity size was normal. Wall thickness was   normal. Systolic function was normal. The estimated ejection   fraction was in the range of 55% to 60%. Wall motion was normal;   there were no regional wall motion abnormalities. Left   ventricular diastolic function parameters were normal. - Aortic valve: A stent-valve (TAVR) bioprosthesis was present and   functioning normally. - Mitral valve: Calcified annulus. Mildly thickened leaflets . - Left atrium: The atrium was moderately dilated.    ASSESSMENT & PLAN:   Severe AS s/p TAVR: doing excellent 1 year out from surgery. He has NHYA class I symptoms. 2D ECHO today shows normal LV function with a normally functioning TAVR valve and no PVL; mean gradient 8 mm Hg. He remains on ASA and plavix. It has been > 6months s/p TAVR. He no longer needs DAPT. Prior to TAVR he was on monotherapy with Plavix. I will go ahead and stop ASA at this time. SBE prophylaxis discussed. He has Amoxil Rx if needed.    CHB s/p PPM: post op complication of TAVR.  Followed by Dr. Ladona Ridgelaylor.   HTN: BP well controlled today.   CAD s/p CABG: bypass grafts patent on pre TAVR cath. Continue medical therapy. As above, he will continue on monotherapy with Plavix and I will stop ASA at this time. Continue statin and BB.   Medication Adjustments/Labs and Tests  Ordered: Current medicines are reviewed at length with the patient today.  Concerns regarding medicines are outlined above.  Medication changes, Labs and Tests ordered today are listed in the Patient Instructions below. Patient Instructions  Medication Instructions:  1) STOP ASPIRIN  Labwork: None  Testing/Procedures: None  Follow-Up: Keep follow-up plans to see Dr. Allyson SabalBerry in September of next year.  Your provider recommends that you schedule a follow-up appointment AS NEEDED [facility].   Any Other Special Instructions Will Be Listed Below (If Applicable).     If you need a refill on your cardiac medications before your next appointment, please call your pharmacy.      Signed, Cline CrockKathryn Gaylynn Seiple, PA-C  12/01/2017 6:26 PM    [facility] [address], [place], [place]  [address] Phone: [phone]; Fax: [phone]) [phone]

## 2017-12-01 ENCOUNTER — Ambulatory Visit: Payer: Medicare Other | Admitting: Physician Assistant

## 2017-12-01 ENCOUNTER — Ambulatory Visit (HOSPITAL_COMMUNITY): Payer: Medicare Other | Attending: Cardiology

## 2017-12-01 ENCOUNTER — Encounter: Payer: Self-pay | Admitting: Physician Assistant

## 2017-12-01 ENCOUNTER — Other Ambulatory Visit: Payer: Self-pay

## 2017-12-01 VITALS — BP 134/58 | HR 60 | Ht 70.0 in | Wt 169.8 lb

## 2017-12-01 DIAGNOSIS — Z950 Presence of cardiac pacemaker: Secondary | ICD-10-CM | POA: Diagnosis not present

## 2017-12-01 DIAGNOSIS — Z952 Presence of prosthetic heart valve: Secondary | ICD-10-CM

## 2017-12-01 DIAGNOSIS — I2510 Atherosclerotic heart disease of native coronary artery without angina pectoris: Secondary | ICD-10-CM | POA: Diagnosis not present

## 2017-12-01 DIAGNOSIS — E039 Hypothyroidism, unspecified: Secondary | ICD-10-CM | POA: Diagnosis not present

## 2017-12-01 DIAGNOSIS — Z953 Presence of xenogenic heart valve: Secondary | ICD-10-CM | POA: Insufficient documentation

## 2017-12-01 DIAGNOSIS — I350 Nonrheumatic aortic (valve) stenosis: Secondary | ICD-10-CM

## 2017-12-01 DIAGNOSIS — E119 Type 2 diabetes mellitus without complications: Secondary | ICD-10-CM | POA: Diagnosis not present

## 2017-12-01 DIAGNOSIS — I10 Essential (primary) hypertension: Secondary | ICD-10-CM | POA: Diagnosis not present

## 2017-12-01 DIAGNOSIS — Z87891 Personal history of nicotine dependence: Secondary | ICD-10-CM | POA: Diagnosis not present

## 2017-12-01 DIAGNOSIS — I6523 Occlusion and stenosis of bilateral carotid arteries: Secondary | ICD-10-CM

## 2017-12-01 DIAGNOSIS — I119 Hypertensive heart disease without heart failure: Secondary | ICD-10-CM | POA: Insufficient documentation

## 2017-12-01 DIAGNOSIS — I059 Rheumatic mitral valve disease, unspecified: Secondary | ICD-10-CM | POA: Diagnosis not present

## 2017-12-01 DIAGNOSIS — Z951 Presence of aortocoronary bypass graft: Secondary | ICD-10-CM | POA: Diagnosis not present

## 2017-12-01 NOTE — Patient Instructions (Signed)
Medication Instructions:  1) STOP ASPIRIN  Labwork: None  Testing/Procedures: None  Follow-Up: Keep follow-up plans to see Dr. Allyson SabalBerry in September of next year.  Your provider recommends that you schedule a follow-up appointment AS NEEDED with [facility].   Any Other Special Instructions Will Be Listed Below (If Applicable).     If you need a refill on your cardiac medications before your next appointment, please call your pharmacy.

## 2017-12-06 ENCOUNTER — Other Ambulatory Visit: Payer: Self-pay | Admitting: Cardiovascular Disease

## 2017-12-06 DIAGNOSIS — I350 Nonrheumatic aortic (valve) stenosis: Secondary | ICD-10-CM

## 2017-12-06 DIAGNOSIS — Z952 Presence of prosthetic heart valve: Secondary | ICD-10-CM

## 2017-12-07 ENCOUNTER — Other Ambulatory Visit (HOSPITAL_COMMUNITY): Payer: Medicare Other

## 2017-12-10 ENCOUNTER — Other Ambulatory Visit (HOSPITAL_COMMUNITY): Payer: Medicare Other

## 2017-12-17 ENCOUNTER — Ambulatory Visit: Payer: Medicare Other | Admitting: Cardiovascular Disease

## 2018-01-11 ENCOUNTER — Encounter: Payer: Self-pay | Admitting: Thoracic Surgery (Cardiothoracic Vascular Surgery)

## 2018-01-17 ENCOUNTER — Ambulatory Visit (INDEPENDENT_AMBULATORY_CARE_PROVIDER_SITE_OTHER): Payer: Medicare Other | Admitting: *Deleted

## 2018-01-17 DIAGNOSIS — I459 Conduction disorder, unspecified: Secondary | ICD-10-CM | POA: Diagnosis not present

## 2018-01-17 DIAGNOSIS — Z950 Presence of cardiac pacemaker: Secondary | ICD-10-CM

## 2018-01-17 NOTE — Progress Notes (Signed)
Remote pacemaker transmission.   

## 2018-01-19 ENCOUNTER — Encounter: Payer: Self-pay | Admitting: Cardiology

## 2018-02-02 LAB — CUP PACEART REMOTE DEVICE CHECK
Date Time Interrogation Session: 20190213112118
Implantable Lead Implant Date: 20171116
Implantable Lead Location: 753859
Implantable Lead Model: 377
Implantable Lead Serial Number: 49591424
Implantable Lead Serial Number: 49679947
MDC IDC LEAD IMPLANT DT: 20171116
MDC IDC LEAD LOCATION: 753860
MDC IDC PG IMPLANT DT: 20171116
MDC IDC PG SERIAL: 68853186

## 2018-02-11 DIAGNOSIS — Z0000 Encounter for general adult medical examination without abnormal findings: Secondary | ICD-10-CM | POA: Diagnosis not present

## 2018-02-11 DIAGNOSIS — E785 Hyperlipidemia, unspecified: Secondary | ICD-10-CM | POA: Diagnosis not present

## 2018-02-11 DIAGNOSIS — N183 Chronic kidney disease, stage 3 (moderate): Secondary | ICD-10-CM | POA: Diagnosis not present

## 2018-02-11 DIAGNOSIS — E1122 Type 2 diabetes mellitus with diabetic chronic kidney disease: Secondary | ICD-10-CM | POA: Diagnosis not present

## 2018-04-07 ENCOUNTER — Ambulatory Visit (HOSPITAL_COMMUNITY)
Admission: RE | Admit: 2018-04-07 | Discharge: 2018-04-07 | Disposition: A | Discharge status: Home / Self Care | Payer: Medicare Other | Source: Ambulatory Visit | Attending: Cardiovascular Disease | Admitting: Cardiovascular Disease

## 2018-04-07 DIAGNOSIS — I6523 Occlusion and stenosis of bilateral carotid arteries: Secondary | ICD-10-CM | POA: Insufficient documentation

## 2018-04-14 ENCOUNTER — Other Ambulatory Visit: Payer: Self-pay | Admitting: Cardiovascular Disease

## 2018-04-14 DIAGNOSIS — I2510 Atherosclerotic heart disease of native coronary artery without angina pectoris: Secondary | ICD-10-CM

## 2018-04-14 DIAGNOSIS — I6523 Occlusion and stenosis of bilateral carotid arteries: Secondary | ICD-10-CM

## 2018-04-18 ENCOUNTER — Ambulatory Visit (INDEPENDENT_AMBULATORY_CARE_PROVIDER_SITE_OTHER): Payer: Medicare Other | Admitting: *Deleted

## 2018-04-18 DIAGNOSIS — I459 Conduction disorder, unspecified: Secondary | ICD-10-CM | POA: Diagnosis not present

## 2018-04-18 NOTE — Progress Notes (Signed)
Remote pacemaker transmission.   

## 2018-04-19 ENCOUNTER — Encounter: Payer: Self-pay | Admitting: Cardiology

## 2018-04-20 LAB — CUP PACEART REMOTE DEVICE CHECK
Battery Remaining Percentage: 90 %
Brady Statistic AP VS Percent: 3 %
Brady Statistic AS VP Percent: 6 %
Brady Statistic AS VS Percent: 48 %
Brady Statistic RV Percent Paced: 50 %
Implantable Lead Implant Date: 20171116
Implantable Lead Location: 753859
Implantable Lead Location: 753860
Implantable Lead Model: 377
Implantable Lead Serial Number: 49591424
Implantable Pulse Generator Implant Date: 20171116
Lead Channel Impedance Value: 469 Ohm
Lead Channel Impedance Value: 674 Ohm
Lead Channel Pacing Threshold Amplitude: 0.8 V
Lead Channel Pacing Threshold Pulse Width: 0.4 ms
Lead Channel Setting Pacing Amplitude: 2 V
Lead Channel Setting Pacing Amplitude: 2.4 V
Lead Channel Setting Pacing Pulse Width: 0.4 ms
MDC IDC LEAD IMPLANT DT: 20171116
MDC IDC LEAD SERIAL: 49679947
MDC IDC MSMT LEADCHNL RA PACING THRESHOLD PULSEWIDTH: 0.4 ms
MDC IDC MSMT LEADCHNL RV PACING THRESHOLD AMPLITUDE: 0.5 V
MDC IDC PG SERIAL: 68853186
MDC IDC SESS DTM: 20190501041014
MDC IDC STAT BRADY AP VP PERCENT: 44 %
MDC IDC STAT BRADY RA PERCENT PACED: 46 %

## 2018-05-27 DIAGNOSIS — Z961 Presence of intraocular lens: Secondary | ICD-10-CM | POA: Diagnosis not present

## 2018-05-27 DIAGNOSIS — H35371 Puckering of macula, right eye: Secondary | ICD-10-CM | POA: Diagnosis not present

## 2018-05-27 DIAGNOSIS — E119 Type 2 diabetes mellitus without complications: Secondary | ICD-10-CM | POA: Diagnosis not present

## 2018-07-18 ENCOUNTER — Ambulatory Visit (INDEPENDENT_AMBULATORY_CARE_PROVIDER_SITE_OTHER): Payer: Medicare Other | Admitting: *Deleted

## 2018-07-18 DIAGNOSIS — I459 Conduction disorder, unspecified: Secondary | ICD-10-CM

## 2018-07-18 NOTE — Progress Notes (Signed)
Remote pacemaker transmission.   

## 2018-07-19 ENCOUNTER — Encounter: Payer: Self-pay | Admitting: Cardiology

## 2018-08-11 DIAGNOSIS — E1122 Type 2 diabetes mellitus with diabetic chronic kidney disease: Secondary | ICD-10-CM | POA: Diagnosis not present

## 2018-08-11 DIAGNOSIS — D649 Anemia, unspecified: Secondary | ICD-10-CM | POA: Diagnosis not present

## 2018-08-11 DIAGNOSIS — N183 Chronic kidney disease, stage 3 (moderate): Secondary | ICD-10-CM | POA: Diagnosis not present

## 2018-08-11 DIAGNOSIS — E785 Hyperlipidemia, unspecified: Secondary | ICD-10-CM | POA: Diagnosis not present

## 2018-08-31 LAB — CUP PACEART REMOTE DEVICE CHECK
Brady Statistic AP VP Percent: 27 %
Brady Statistic AS VP Percent: 5 %
Brady Statistic RV Percent Paced: 32 %
Implantable Lead Implant Date: 20171116
Implantable Lead Location: 753860
Implantable Lead Model: 377
Implantable Lead Model: 377
Implantable Lead Serial Number: 49679947
Implantable Pulse Generator Implant Date: 20171116
Lead Channel Impedance Value: 474 Ohm
Lead Channel Impedance Value: 677 Ohm
Lead Channel Pacing Threshold Amplitude: 0.5 V
Lead Channel Pacing Threshold Amplitude: 0.8 V
Lead Channel Pacing Threshold Pulse Width: 0.4 ms
Lead Channel Setting Pacing Amplitude: 2 V
Lead Channel Setting Pacing Pulse Width: 0.4 ms
MDC IDC LEAD IMPLANT DT: 20171116
MDC IDC LEAD LOCATION: 753859
MDC IDC LEAD SERIAL: 49591424
MDC IDC MSMT BATTERY REMAINING PERCENTAGE: 85 %
MDC IDC MSMT LEADCHNL RA PACING THRESHOLD PULSEWIDTH: 0.4 ms
MDC IDC PG SERIAL: 68853186
MDC IDC SESS DTM: 20190911032203
MDC IDC SET LEADCHNL RV PACING AMPLITUDE: 2.4 V
MDC IDC STAT BRADY AP VS PERCENT: 13 %
MDC IDC STAT BRADY AS VS PERCENT: 55 %
MDC IDC STAT BRADY RA PERCENT PACED: 40 %

## 2018-10-04 DIAGNOSIS — Z23 Encounter for immunization: Secondary | ICD-10-CM | POA: Diagnosis not present

## 2018-10-17 ENCOUNTER — Ambulatory Visit (INDEPENDENT_AMBULATORY_CARE_PROVIDER_SITE_OTHER): Payer: Medicare Other | Admitting: *Deleted

## 2018-10-17 DIAGNOSIS — I459 Conduction disorder, unspecified: Secondary | ICD-10-CM | POA: Diagnosis not present

## 2018-10-17 DIAGNOSIS — I10 Essential (primary) hypertension: Secondary | ICD-10-CM

## 2018-10-17 NOTE — Progress Notes (Signed)
Remote pacemaker transmission.   

## 2018-11-21 ENCOUNTER — Encounter: Payer: Self-pay | Admitting: Internal Medicine

## 2018-11-21 ENCOUNTER — Ambulatory Visit: Payer: Medicare Other | Admitting: Internal Medicine

## 2018-11-21 VITALS — BP 114/60 | HR 62 | Ht 70.0 in | Wt 177.2 lb

## 2018-11-21 DIAGNOSIS — I10 Essential (primary) hypertension: Secondary | ICD-10-CM | POA: Diagnosis not present

## 2018-11-21 DIAGNOSIS — Z950 Presence of cardiac pacemaker: Secondary | ICD-10-CM | POA: Diagnosis not present

## 2018-11-21 DIAGNOSIS — Z952 Presence of prosthetic heart valve: Secondary | ICD-10-CM

## 2018-11-21 DIAGNOSIS — I459 Conduction disorder, unspecified: Secondary | ICD-10-CM

## 2018-11-21 LAB — CUP PACEART INCLINIC DEVICE CHECK
Date Time Interrogation Session: 20191202092914
Implantable Lead Implant Date: 20171116
Implantable Lead Location: 753859
Implantable Lead Location: 753860
Implantable Lead Model: 377
Implantable Lead Serial Number: 49591424
Implantable Lead Serial Number: 49679947
Implantable Pulse Generator Implant Date: 20171116
MDC IDC LEAD IMPLANT DT: 20171116
MDC IDC PG SERIAL: 68853186

## 2018-11-21 NOTE — Progress Notes (Signed)
HPI [name] returns today for ongoing evaluation and management of his PPM. He developed CHB after TAVR. He has done well, s/p PPM insertion over 2 years ago. He denies chest pain or sob. His wife notes that he is sedentary. He states he walks most days at [employer]. No Known Allergies   Current Outpatient Medications  Medication Sig Dispense Refill  . amLODipine (NORVASC) 10 MG tablet Take 10 mg by mouth daily with breakfast.   6  . amoxicillin (AMOXIL) 500 MG capsule Take 4 tablets I hour before dental procedure 4 capsule 0  . benazepril (LOTENSIN) 20 MG tablet Take 20 mg by mouth daily with breakfast.   6  . carvedilol (COREG) 6.25 MG tablet Take 6.25 mg by mouth 2 (two) times daily with a meal.    . clopidogrel (PLAVIX) 75 MG tablet Take 75 mg by mouth daily.     Marland Kitchen. levothyroxine (SYNTHROID, LEVOTHROID) 100 MCG tablet Take 100 mcg by mouth at bedtime.  1  . Lutein 6 MG CAPS Take 6 mg by mouth daily with breakfast.     . metFORMIN (GLUCOPHAGE-XR) 500 MG 24 hr tablet Take 1 tablet by mouth every morning.  1  . simvastatin (ZOCOR) 20 MG tablet Take 20 mg by mouth every evening.      No current facility-administered medications for this visit.      Past Medical History:  Diagnosis Date  . Aortic stenosis    a. s/p TAVR on 11/03/16  . Arthritis   . CAD (coronary artery disease)    a. CAD s/p CABG 2000   b. cath on 10/05/16 with severe native vessel CAD and patent bypass grafts  . Carotid stenosis   . Diabetes mellitus type 2, noninsulin dependent (HCC)   . Heart block AV second degree    a. 10/2016: after TAVR. s/p Biotrionic PPM: (serial number [id number])   . Hyperlipidemia   . Hypertension   . Hypothyroidism     ROS:   All systems reviewed and negative except as noted in the HPI.   Past Surgical History:  Procedure Laterality Date  . BACK SURGERY    . CARDIAC CATHETERIZATION  11/10/1999   3-vessel disease, recommendation for CABG  . CARDIAC CATHETERIZATION  N/A 10/05/2016   Procedure: Right/Left Heart Cath and Coronary/Graft Angiography;  Surgeon: Runell GessJonathan J Berry, MD;  [facility];  Service: Cardiovascular;  Laterality: N/A;  . CORONARY ARTERY BYPASS GRAFT  11/13/1999   LIMA to LAD and a vein graft to the first OM branch, second OM branch, and to the RCA (Dr. Wayland SalinasB. Bartle)  . EP IMPLANTABLE DEVICE N/A 11/05/2016   Procedure: Pacemaker Implant;  Surgeon: Marinus MawGregg W Taylor, MD;  [facility];  Service: Cardiovascular;  Laterality: N/A;  . EYE SURGERY     eye duct surgery  . NM MYOCAR PERF WALL MOTION  09/2013   normal lexiscan myoview  . PERIPHERAL VASCULAR CATHETERIZATION N/A 10/05/2016   Procedure: Abdominal Aortogram;  Surgeon: Runell GessJonathan J Berry, MD;  [facility];  Service: Cardiovascular;  Laterality: N/A;  . TEE WITHOUT CARDIOVERSION N/A 11/03/2016   Procedure: TRANSESOPHAGEAL ECHOCARDIOGRAM (TEE);  Surgeon: Tonny BollmanMichael Cooper, MD;  [facility];  Service: Open Heart Surgery;  Laterality: N/A;  . TRANSCATHETER AORTIC VALVE REPLACEMENT, TRANSFEMORAL N/A 11/03/2016   Procedure: TRANSCATHETER AORTIC VALVE REPLACEMENT, TRANSFEMORAL;  Surgeon: Tonny BollmanMichael Cooper, MD;  [facility];  Service: Open Heart Surgery;  Laterality: N/A;  .  TRANSTHORACIC ECHOCARDIOGRAM  12/2013   EF 55-60%, grade 1 diastolic dysfunction, high ventricular filling pressure; severe AV stenosis; calcified MV annulus; LA & RA mildly dilated; atrial septal aneurysm     Family History  Problem Relation Age of Onset  . Lung disease Mother   . Diabetes Mother   . Colon cancer Father   . Diabetes Sister   . CAD Sister      Social History   Socioeconomic History  . Marital status: Married    Spouse name: Not on file  . Number of children: 2  . Years of education: GED  . Highest education level: Not on file  Occupational History  . Not on file  Social Needs  . Financial resource strain: Not on file  . Food insecurity:     Worry: Not on file    Inability: Not on file  . Transportation needs:    Medical: Not on file    Non-medical: Not on file  Tobacco Use  . Smoking status: Former Smoker    Packs/day: 1.00    Years: 5.00    Pack years: 5.00    Types: Cigarettes    Last attempt to quit: 07/20/1973    Years since quitting: 45.3  . Smokeless tobacco: Never Used  Substance and Sexual Activity  . Alcohol use: No  . Drug use: No  . Sexual activity: Not on file  Lifestyle  . Physical activity:    Days per week: Not on file    Minutes per session: Not on file  . Stress: Not on file  Relationships  . Social connections:    Talks on phone: Not on file    Gets together: Not on file    Attends religious service: Not on file    Active member of club or organization: Not on file    Attends meetings of clubs or organizations: Not on file    Relationship status: Not on file  . Intimate partner violence:    Fear of current or ex partner: Not on file    Emotionally abused: Not on file    Physically abused: Not on file    Forced sexual activity: Not on file  Other Topics Concern  . Not on file  Social History Narrative  . Not on file     BP 114/60   Pulse 62   Ht 5\' 10"  (1.778 m)   Wt 177 lb 3.2 oz (80.4 kg)   BMI 25.43 kg/m   Physical Exam:  Well appearing NAD HEENT: Unremarkable Neck:  No JVD, no thyromegally Lymphatics:  No adenopathy Back:  No CVA tenderness Lungs:  Clear with no wheezes HEART:  Regular rate rhythm, no murmurs, no rubs, no clicks Abd:  soft, positive bowel sounds, no organomegally, no rebound, no guarding Ext:  2 plus pulses, no edema, no cyanosis, no clubbing Skin:  No rashes no nodules Neuro:  CN II through XII intact, motor grossly intact  EKG - nsr  DEVICE  Normal device function.  See PaceArt for details.   Assess/Plan: 1. CHB - he is conducting today. He is asymptomatic. 2. PPM - his Biotronik DDD PM is working normally.  3. AS - his aortic valve appears to  be working normally with no murmurs.  Leonia Reeves.D.

## 2018-11-21 NOTE — Patient Instructions (Signed)
Medication Instructions:  Your physician recommends that you continue on your current medications as directed. Please refer to the Current Medication list given to you today.  Labwork: None ordered.  Testing/Procedures: None ordered.  Follow-Up: Your physician wants you to follow-up in: one year with Dr. Ladona Ridgelaylor.   You will receive a reminder letter in the mail two months in advance. If you don't receive a letter, please call our office to schedule the follow-up appointment.  Remote monitoring is used to monitor your Pacemaker from home. This monitoring reduces the number of office visits required to check your device to one time per year. It allows us to keep an eye on the functioning of your device to ensure it is working properly. You are scheduled for a device check from home on 01/16/2019. You may send your transmission at any time that day. If you have a wireless device, the transmission will be sent automatically. After your physician reviews your transmission, you will receive a postcard with your next transmission date.  Any Other Special Instructions Will Be Listed Below (If Applicable).  If you need a refill on your cardiac medications before your next appointment, please call your pharmacy.

## 2018-11-25 DIAGNOSIS — H5212 Myopia, left eye: Secondary | ICD-10-CM | POA: Diagnosis not present

## 2018-11-25 DIAGNOSIS — H5213 Myopia, bilateral: Secondary | ICD-10-CM | POA: Diagnosis not present

## 2018-11-25 DIAGNOSIS — H35371 Puckering of macula, right eye: Secondary | ICD-10-CM | POA: Diagnosis not present

## 2018-11-25 DIAGNOSIS — E119 Type 2 diabetes mellitus without complications: Secondary | ICD-10-CM | POA: Diagnosis not present

## 2018-12-06 ENCOUNTER — Other Ambulatory Visit: Payer: Self-pay

## 2018-12-06 ENCOUNTER — Ambulatory Visit (HOSPITAL_COMMUNITY): Payer: Medicare Other | Attending: Cardiovascular Disease

## 2018-12-06 DIAGNOSIS — Z952 Presence of prosthetic heart valve: Secondary | ICD-10-CM | POA: Insufficient documentation

## 2018-12-07 ENCOUNTER — Other Ambulatory Visit: Payer: Self-pay | Admitting: *Deleted

## 2018-12-07 DIAGNOSIS — Z952 Presence of prosthetic heart valve: Secondary | ICD-10-CM

## 2018-12-20 LAB — CUP PACEART REMOTE DEVICE CHECK
Implantable Lead Implant Date: 20171116
Implantable Lead Location: 753860
Implantable Lead Model: 377
Implantable Lead Model: 377
Implantable Lead Serial Number: 49591424
MDC IDC LEAD IMPLANT DT: 20171116
MDC IDC LEAD LOCATION: 753859
MDC IDC LEAD SERIAL: 49679947
MDC IDC PG IMPLANT DT: 20171116
MDC IDC PG SERIAL: 68853186
MDC IDC SESS DTM: 20191231072934

## 2019-01-16 ENCOUNTER — Ambulatory Visit (INDEPENDENT_AMBULATORY_CARE_PROVIDER_SITE_OTHER): Payer: Medicare Other

## 2019-01-16 DIAGNOSIS — I459 Conduction disorder, unspecified: Secondary | ICD-10-CM

## 2019-01-17 NOTE — Progress Notes (Signed)
Remote pacemaker transmission.   

## 2019-01-19 LAB — CUP PACEART REMOTE DEVICE CHECK
Date Time Interrogation Session: 20200130150807
Implantable Lead Location: 753859
Implantable Lead Serial Number: 49591424
Implantable Lead Serial Number: 49679947
Implantable Pulse Generator Implant Date: 20171116
MDC IDC LEAD IMPLANT DT: 20171116
MDC IDC LEAD IMPLANT DT: 20171116
MDC IDC LEAD LOCATION: 753860
MDC IDC PG SERIAL: 68853186
Pulse Gen Model: 407145

## 2019-02-15 DIAGNOSIS — N183 Chronic kidney disease, stage 3 (moderate): Secondary | ICD-10-CM | POA: Diagnosis not present

## 2019-02-15 DIAGNOSIS — E1122 Type 2 diabetes mellitus with diabetic chronic kidney disease: Secondary | ICD-10-CM | POA: Diagnosis not present

## 2019-02-15 DIAGNOSIS — E785 Hyperlipidemia, unspecified: Secondary | ICD-10-CM | POA: Diagnosis not present

## 2019-02-15 DIAGNOSIS — I259 Chronic ischemic heart disease, unspecified: Secondary | ICD-10-CM | POA: Diagnosis not present

## 2019-04-17 ENCOUNTER — Ambulatory Visit (INDEPENDENT_AMBULATORY_CARE_PROVIDER_SITE_OTHER): Payer: Medicare Other | Admitting: *Deleted

## 2019-04-17 ENCOUNTER — Other Ambulatory Visit: Payer: Self-pay

## 2019-04-17 DIAGNOSIS — I350 Nonrheumatic aortic (valve) stenosis: Secondary | ICD-10-CM

## 2019-04-17 DIAGNOSIS — I459 Conduction disorder, unspecified: Secondary | ICD-10-CM

## 2019-04-18 LAB — CUP PACEART REMOTE DEVICE CHECK
Date Time Interrogation Session: 20200428102833
Implantable Lead Implant Date: 20171116
Implantable Lead Implant Date: 20171116
Implantable Lead Location: 753859
Implantable Lead Location: 753860
Implantable Lead Model: 377
Implantable Lead Model: 377
Implantable Lead Serial Number: 49591424
Implantable Lead Serial Number: 49679947
Implantable Pulse Generator Implant Date: 20171116
Pulse Gen Model: 407145
Pulse Gen Serial Number: 68853186

## 2019-04-25 NOTE — Progress Notes (Signed)
Remote pacemaker transmission.   

## 2019-05-18 DIAGNOSIS — Z0120 Encounter for dental examination and cleaning without abnormal findings: Secondary | ICD-10-CM | POA: Diagnosis not present

## 2019-05-26 DIAGNOSIS — H33102 Unspecified retinoschisis, left eye: Secondary | ICD-10-CM | POA: Diagnosis not present

## 2019-05-26 DIAGNOSIS — E119 Type 2 diabetes mellitus without complications: Secondary | ICD-10-CM | POA: Diagnosis not present

## 2019-05-26 DIAGNOSIS — H35371 Puckering of macula, right eye: Secondary | ICD-10-CM | POA: Diagnosis not present

## 2019-07-17 ENCOUNTER — Ambulatory Visit (INDEPENDENT_AMBULATORY_CARE_PROVIDER_SITE_OTHER): Payer: Medicare Other | Admitting: *Deleted

## 2019-07-17 DIAGNOSIS — I10 Essential (primary) hypertension: Secondary | ICD-10-CM

## 2019-07-17 DIAGNOSIS — I447 Left bundle-branch block, unspecified: Secondary | ICD-10-CM

## 2019-07-19 LAB — CUP PACEART REMOTE DEVICE CHECK
Date Time Interrogation Session: 20200729164028
Implantable Lead Implant Date: 20171116
Implantable Lead Implant Date: 20171116
Implantable Lead Location: 753859
Implantable Lead Location: 753860
Implantable Lead Model: 377
Implantable Lead Model: 377
Implantable Lead Serial Number: 49591424
Implantable Lead Serial Number: 49679947
Implantable Pulse Generator Implant Date: 20171116
Pulse Gen Model: 407145
Pulse Gen Serial Number: 68853186

## 2019-08-03 NOTE — Progress Notes (Signed)
Remote pacemaker transmission.   

## 2019-08-09 DIAGNOSIS — I10 Essential (primary) hypertension: Secondary | ICD-10-CM | POA: Diagnosis not present

## 2019-08-09 DIAGNOSIS — N183 Chronic kidney disease, stage 3 (moderate): Secondary | ICD-10-CM | POA: Diagnosis not present

## 2019-08-09 DIAGNOSIS — E785 Hyperlipidemia, unspecified: Secondary | ICD-10-CM | POA: Diagnosis not present

## 2019-08-09 DIAGNOSIS — E1122 Type 2 diabetes mellitus with diabetic chronic kidney disease: Secondary | ICD-10-CM | POA: Diagnosis not present

## 2019-09-19 DIAGNOSIS — Z23 Encounter for immunization: Secondary | ICD-10-CM | POA: Diagnosis not present

## 2019-10-02 DIAGNOSIS — J209 Acute bronchitis, unspecified: Secondary | ICD-10-CM | POA: Diagnosis not present

## 2019-10-17 ENCOUNTER — Ambulatory Visit (INDEPENDENT_AMBULATORY_CARE_PROVIDER_SITE_OTHER): Payer: Medicare Other | Admitting: *Deleted

## 2019-10-17 DIAGNOSIS — I447 Left bundle-branch block, unspecified: Secondary | ICD-10-CM | POA: Diagnosis not present

## 2019-10-18 LAB — CUP PACEART REMOTE DEVICE CHECK
Date Time Interrogation Session: 20201028070825
Implantable Lead Implant Date: 20171116
Implantable Lead Implant Date: 20171116
Implantable Lead Location: 753859
Implantable Lead Location: 753860
Implantable Lead Model: 377
Implantable Lead Model: 377
Implantable Lead Serial Number: 49591424
Implantable Lead Serial Number: 49679947
Implantable Pulse Generator Implant Date: 20171116
Pulse Gen Model: 407145
Pulse Gen Serial Number: 68853186

## 2019-10-20 DIAGNOSIS — I10 Essential (primary) hypertension: Secondary | ICD-10-CM | POA: Diagnosis not present

## 2019-10-20 DIAGNOSIS — E039 Hypothyroidism, unspecified: Secondary | ICD-10-CM | POA: Diagnosis not present

## 2019-11-07 NOTE — Progress Notes (Signed)
Remote pacemaker transmission.   

## 2019-11-08 DIAGNOSIS — Z0120 Encounter for dental examination and cleaning without abnormal findings: Secondary | ICD-10-CM | POA: Diagnosis not present

## 2019-11-22 ENCOUNTER — Encounter: Payer: Self-pay | Admitting: Cardiovascular Disease

## 2019-11-22 ENCOUNTER — Ambulatory Visit (INDEPENDENT_AMBULATORY_CARE_PROVIDER_SITE_OTHER): Payer: Medicare Other | Admitting: Cardiovascular Disease

## 2019-11-22 ENCOUNTER — Other Ambulatory Visit: Payer: Self-pay

## 2019-11-22 VITALS — BP 153/72 | HR 61 | Temp 96.0°F | Ht 70.0 in | Wt 171.0 lb

## 2019-11-22 DIAGNOSIS — I6523 Occlusion and stenosis of bilateral carotid arteries: Secondary | ICD-10-CM

## 2019-11-22 DIAGNOSIS — E782 Mixed hyperlipidemia: Secondary | ICD-10-CM | POA: Diagnosis not present

## 2019-11-22 DIAGNOSIS — I10 Essential (primary) hypertension: Secondary | ICD-10-CM | POA: Diagnosis not present

## 2019-11-22 DIAGNOSIS — I2510 Atherosclerotic heart disease of native coronary artery without angina pectoris: Secondary | ICD-10-CM

## 2019-11-22 DIAGNOSIS — I350 Nonrheumatic aortic (valve) stenosis: Secondary | ICD-10-CM

## 2019-11-22 DIAGNOSIS — I459 Conduction disorder, unspecified: Secondary | ICD-10-CM

## 2019-11-22 NOTE — Assessment & Plan Note (Signed)
History of essential hypertension blood pressure measured today at 153/72.  He is on amlodipine, carvedilol and benazepril.

## 2019-11-22 NOTE — Assessment & Plan Note (Signed)
Transient heart block status post permanent transvenous pacemaker by Dr. Lovena Le who follows him for this as an outpatient.

## 2019-11-22 NOTE — Progress Notes (Signed)
11/22/2019 Thomas English   21-Mar-1932  [id number]  Primary Physician Street, Stephanie Coup, MD Primary Cardiologist: Runell Gess MD FACP, [place], [place], [place]  HPI:  Thomas English is a 85 y.o.  mildly overweight, married Caucasian male, father of 2, grandfather to 1 grandchild who I last saw  09/07/2017. He is accompanied by his wife today. He has a history of CAD status post coronary artery bypass grafting in 2000, with a LIMA to his LAD, a vein graft to the first obtuse marginal branch, second obtuse marginal branch and to the RCA. His other problems include hypertension, hyperlipidemia and noninsulin-requiring diabetes. He does have bilateral internal carotid artery stenosis left greater than right which has remained stable over the last 6 months. He is neurologically asymptomatic. He also has mild aortic stenosis with valve area of 1.4 cm squared. His last Myoview performed October 06, 2011, was low risk. He is asymptomatic. Dr. Sheria Lang follows his lipid profile. He has had progressive aortic stenosis with his most recent echo performed 02/28/16 were revealing preserved LV function . He also has moderate bilateral ICA stenosis by duplex ultrasound. When I saw him back in October of last year his dyspnea had progressed as had his aortic stenosis. I performed right and left heart cath on him 10/05/16 revealing patent grafts and he ultimately underwent TAV are Edwards CPE in 3 valve implantation by Drs. 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 Ladona Ridgel this follow-up 2-D echo performed 12/10/16 revealed normal LV function with a well-functioning bioprosthesis. He feels clinically improved, denies shortness of breath and has more energy.  Since I saw him 2 years ago he is remained stable.  Carotid Dopplers performed April 2019 showed moderate bilateral ICA stenosis.  His last 2D echo  performed 12/06/2018 revealed normal LV function with a well-functioning aortic bioprosthesis.  Dr. Ladona Ridgel saw him December 2019 for pacemaker follow-up and this was functioning well.  He is completely asymptomatic and denies chest pain or shortness of breath.    Current Meds  Medication Sig  . amLODipine (NORVASC) 10 MG tablet Take 10 mg by mouth daily with breakfast.   . amoxicillin (AMOXIL) 500 MG capsule Take 4 tablets I hour before dental procedure  . benazepril (LOTENSIN) 20 MG tablet Take 20 mg by mouth daily with breakfast.   . carvedilol (COREG) 6.25 MG tablet Take 6.25 mg by mouth 2 (two) times daily with a meal.  . clopidogrel (PLAVIX) 75 MG tablet Take 75 mg by mouth daily.   Marland Kitchen levothyroxine (SYNTHROID, LEVOTHROID) 100 MCG tablet Take 100 mcg by mouth at bedtime.  . metFORMIN (GLUCOPHAGE-XR) 500 MG 24 hr tablet Take 1 tablet by mouth every morning.  . simvastatin (ZOCOR) 20 MG tablet Take 20 mg by mouth every evening.      No Known Allergies  Social History   Socioeconomic History  . Marital status: Married    Spouse name: Not on file  . Number of children: 2  . Years of education: GED  . Highest education level: Not on file  Occupational History  . Not on file  Social Needs  . Financial resource strain: Not on file  . Food insecurity    Worry: Not on file    Inability: Not on file  . Transportation needs    Medical: Not on file    Non-medical: Not on file  Tobacco Use  . Smoking status:  Former Smoker    Packs/day: 1.00    Years: 5.00    Pack years: 5.00    Types: Cigarettes    Quit date: 07/20/1973    Years since quitting: 46.3  . Smokeless tobacco: Never Used  Substance and Sexual Activity  . Alcohol use: No  . Drug use: No  . Sexual activity: Not on file  Lifestyle  . Physical activity    Days per week: Not on file    Minutes per session: Not on file  . Stress: Not on file  Relationships  . Social Musicianconnections    Talks on phone: Not on file     Gets together: Not on file    Attends religious service: Not on file    Active member of club or organization: Not on file    Attends meetings of clubs or organizations: Not on file    Relationship status: Not on file  . Intimate partner violence    Fear of current or ex partner: Not on file    Emotionally abused: Not on file    Physically abused: Not on file    Forced sexual activity: Not on file  Other Topics Concern  . Not on file  Social History Narrative  . Not on file     Review of Systems: General: negative for chills, fever, night sweats or weight changes.  Cardiovascular: negative for chest pain, dyspnea on exertion, edema, orthopnea, palpitations, paroxysmal nocturnal dyspnea or shortness of breath Dermatological: negative for rash Respiratory: negative for cough or wheezing Urologic: negative for hematuria Abdominal: negative for nausea, vomiting, diarrhea, bright red blood per rectum, melena, or hematemesis Neurologic: negative for visual changes, syncope, or dizziness All other systems reviewed and are otherwise negative except as noted above.    Blood pressure (!) 153/72, pulse 61, temperature (!) 96 F (35.6 C), height 5\' 10"  (1.778 m), weight 171 lb (77.6 kg), SpO2 98 %.  General appearance: alert and no distress Neck: no adenopathy, no JVD, supple, symmetrical, trachea midline, thyroid not enlarged, symmetric, no tenderness/mass/nodules and Soft bilateral carotid bruits Lungs: clear to auscultation bilaterally Heart: Soft outflow tract murmur consistent with aortic stenosis Extremities: extremities normal, atraumatic, no cyanosis or edema Pulses: 2+ and symmetric Skin: Skin color, texture, turgor normal. No rashes or lesions Neurologic: Alert and oriented X 3, normal strength and tone. Normal symmetric reflexes. Normal coordination and gait  EKG sinus rhythm at 61 with an atrially paced rhythm.  Personally reviewed this EKG.  ASSESSMENT AND PLAN:   Carotid  stenosis, bil, last dopplers 12/2013 50-69% History of moderate bilateral ICA stenosis by duplex ultrasound 04/07/2018..  This will be repeated.  Essential hypertension History of essential hypertension blood pressure measured today at 153/72.  He is on amlodipine, carvedilol and benazepril.  Hyperlipidemia History of hyperlipidemia on simvastatin with lipid profile performed 08/09/2019 revealing a total cholesterol 113, LDL of 56 and HDL 42  Coronary artery disease History of CAD status post CABG in 2000 with a LIMA to his LAD, vein to the first obtuse marginal branch, second obtuse marginal branch and to the RCA.  Because of progressive aortic stenosis I performed right and left heart cath on him 10/05/2016 revealing patent grafts and severe aortic stenosis.  He denies chest pain or shortness of breath.  Severe aortic stenosis History of severe aortic stenosis status post TAVR with a 26 mm Edwards Sapien 3 THV.  This resulted in marked improvement in his symptoms.  His most recent echo performed  12/06/2018 revealed normal LV systolic function with a well-functioning aortic bioprosthesis and mild pulmonary hypertension with grade 2 diastolic dysfunction.  We will repeat 2D echocardiogram  Heart block s/p Biotronic PPM 10/2016 Transient heart block status post permanent transvenous pacemaker by Dr. Lovena Le who follows him for this as an outpatient.      Lorretta Harp MD FACP,FACC,FAHA, [facility] 11/22/2019 12:24 PM

## 2019-11-22 NOTE — Assessment & Plan Note (Signed)
History of hyperlipidemia on simvastatin with lipid profile performed 08/09/2019 revealing a total cholesterol 113, LDL of 56 and HDL 42

## 2019-11-22 NOTE — Assessment & Plan Note (Signed)
History of CAD status post CABG in 2000 with a LIMA to his LAD, vein to the first obtuse marginal branch, second obtuse marginal branch and to the RCA.  Because of progressive aortic stenosis I performed right and left heart cath on him 10/05/2016 revealing patent grafts and severe aortic stenosis.  He denies chest pain or shortness of breath.

## 2019-11-22 NOTE — Assessment & Plan Note (Signed)
History of severe aortic stenosis status post TAVR with a 26 mm Edwards Sapien 3 THV.  This resulted in marked improvement in his symptoms.  His most recent echo performed 12/06/2018 revealed normal LV systolic function with a well-functioning aortic bioprosthesis and mild pulmonary hypertension with grade 2 diastolic dysfunction.  We will repeat 2D echocardiogram

## 2019-11-22 NOTE — Assessment & Plan Note (Signed)
History of moderate bilateral ICA stenosis by duplex ultrasound 04/07/2018..  This will be repeated.

## 2019-11-22 NOTE — Patient Instructions (Signed)
Medication Instructions:  Your physician recommends that you continue on your current medications as directed. Please refer to the Current Medication list given to you today.  If you need a refill on your cardiac medications before your next appointment, please call your pharmacy.   Lab work: NONE  Testing/Procedures: Your physician has requested that you have an echocardiogram. Echocardiography is a painless test that uses sound waves to create images of your heart. It provides your doctor with information about the size and shape of your heart and how well your heart's chambers and valves are working. This procedure takes approximately one hour. There are no restrictions for this procedure. [place] has requested that you have a lower extremity arterial exercise duplex. During this test, exercise and ultrasound are used to evaluate arterial blood flow in the legs. Allow one hour for this exam. There are no restrictions or special instructions.  AND  Your physician has requested that you have an ankle brachial index (ABI). During this test an ultrasound and blood pressure cuff are used to evaluate the arteries that supply the arms and legs with blood. Allow thirty minutes for this exam. There are no restrictions or special instructions.   Follow-Up: At [facility], you and your health needs are our priority.  As part of our continuing mission to provide you with exceptional heart care, we have created designated Provider Care Teams.  These Care Teams include your primary Cardiologist (physician) and Advanced Practice Providers (APPs -  Physician Assistants and Nurse Practitioners) who all work together to provide you with the care you need, when you need it. You may see Dr Gwenlyn Found or one of the following Advanced Practice Providers on your designated Care Team:    Kerin Ransom, PA-C  [place], [place]  Coletta Memos, [place]  Your physician  wants you to follow-up in: 1 year.

## 2019-11-27 ENCOUNTER — Other Ambulatory Visit: Payer: Self-pay

## 2019-11-27 ENCOUNTER — Ambulatory Visit: Payer: Medicare Other | Admitting: Student

## 2019-11-27 ENCOUNTER — Encounter: Payer: Self-pay | Admitting: Student

## 2019-11-27 VITALS — BP 120/62 | HR 68 | Ht 70.0 in | Wt 177.0 lb

## 2019-11-27 DIAGNOSIS — I459 Conduction disorder, unspecified: Secondary | ICD-10-CM

## 2019-11-27 DIAGNOSIS — I350 Nonrheumatic aortic (valve) stenosis: Secondary | ICD-10-CM

## 2019-11-27 DIAGNOSIS — I10 Essential (primary) hypertension: Secondary | ICD-10-CM

## 2019-11-27 LAB — CUP PACEART INCLINIC DEVICE CHECK
Date Time Interrogation Session: 20201207103128
Implantable Lead Implant Date: 20171116
Implantable Lead Implant Date: 20171116
Implantable Lead Location: 753859
Implantable Lead Location: 753860
Implantable Lead Model: 377
Implantable Lead Model: 377
Implantable Lead Serial Number: 49591424
Implantable Lead Serial Number: 49679947
Implantable Pulse Generator Implant Date: 20171116
Lead Channel Impedance Value: 721 Ohm
Lead Channel Impedance Value: 955 Ohm
Lead Channel Pacing Threshold Amplitude: 0.5 V
Lead Channel Pacing Threshold Amplitude: 0.5 V
Lead Channel Pacing Threshold Amplitude: 0.5 V
Lead Channel Pacing Threshold Amplitude: 1.2 V
Lead Channel Pacing Threshold Amplitude: 1.2 V
Lead Channel Pacing Threshold Amplitude: 1.4 V
Lead Channel Pacing Threshold Pulse Width: 0.4 ms
Lead Channel Pacing Threshold Pulse Width: 0.4 ms
Lead Channel Pacing Threshold Pulse Width: 0.4 ms
Lead Channel Pacing Threshold Pulse Width: 0.4 ms
Lead Channel Pacing Threshold Pulse Width: 0.4 ms
Lead Channel Pacing Threshold Pulse Width: 0.4 ms
Lead Channel Sensing Intrinsic Amplitude: 12.1 mV
Lead Channel Sensing Intrinsic Amplitude: 12.4 mV
Lead Channel Sensing Intrinsic Amplitude: 5.7 mV
Lead Channel Sensing Intrinsic Amplitude: 5.7 mV
Lead Channel Setting Pacing Amplitude: 2 V
Lead Channel Setting Pacing Amplitude: 2.4 V
Lead Channel Setting Pacing Pulse Width: 0.4 ms
Pulse Gen Model: 407145
Pulse Gen Serial Number: 68853186

## 2019-11-27 NOTE — Progress Notes (Signed)
Electrophysiology Office Note Date: 11/27/2019  ID:  Thomas English, Thomas English [birth year], MRN [id number]  PCP: Street, Sharon Mt, MD Primary Cardiologist: No primary care provider on file. Electrophysiologist: Dr. Lovena Le   CC: Pacemaker follow-up  Thomas English is a 85 y.o. male seen today for Dr. Lovena Le . he presents today for routine electrophysiology followup.  Since last being seen in our clinic, the patient reports doing very well.  he denies chest pain, palpitations, dyspnea, PND, orthopnea, nausea, vomiting, dizziness, syncope, edema, weight gain, or early satiety.  Device History: Biotronik Dual Chamber PPM implanted 11/05/2016 for 2:1 AV block s/p TAVR  Past Medical History:  Diagnosis Date   Aortic stenosis    a. s/p TAVR on 11/03/16   Arthritis    CAD (coronary artery disease)    a. CAD s/p CABG 2000   b. cath on 10/05/16 with severe native vessel CAD and patent bypass grafts   Carotid stenosis    Diabetes mellitus type 2, noninsulin dependent ([place])    Heart block AV second degree    a. 10/2016: after TAVR. s/p Biotrionic PPM: (serial number [id number])    Hyperlipidemia    Hypertension    Hypothyroidism    Past Surgical History:  Procedure Laterality Date   BACK SURGERY     CARDIAC CATHETERIZATION  11/10/1999   3-vessel disease, recommendation for CABG   CARDIAC CATHETERIZATION N/A 10/05/2016   Procedure: Right/Left Heart Cath and Coronary/Graft Angiography;  Surgeon: Lorretta Harp, MD;  Location: [place] [facility];  Service: Cardiovascular;  Laterality: N/A;   CORONARY ARTERY BYPASS GRAFT  11/13/1999   LIMA to LAD and a vein graft to the first OM branch, second OM branch, and to the RCA (Dr. Ellison Hughs)   EP IMPLANTABLE DEVICE N/A 11/05/2016   Procedure: Pacemaker Implant;  Surgeon: Evans Lance, MD;  Location: [place] [facility];  Service: Cardiovascular;  Laterality: N/A;   EYE SURGERY     eye duct surgery   NM MYOCAR PERF WALL MOTION   09/2013   normal lexiscan myoview   PERIPHERAL VASCULAR CATHETERIZATION N/A 10/05/2016   Procedure: Abdominal Aortogram;  Surgeon: Lorretta Harp, MD;  Location: [place] [facility];  Service: Cardiovascular;  Laterality: N/A;   TEE WITHOUT CARDIOVERSION N/A 11/03/2016   Procedure: TRANSESOPHAGEAL ECHOCARDIOGRAM (TEE);  Surgeon: Sherren Mocha, MD;  Location: [facility];  Service: Open Heart Surgery;  Laterality: N/A;   TRANSCATHETER AORTIC VALVE REPLACEMENT, TRANSFEMORAL N/A 11/03/2016   Procedure: TRANSCATHETER AORTIC VALVE REPLACEMENT, TRANSFEMORAL;  Surgeon: Sherren Mocha, MD;  Location: [facility];  Service: Open Heart Surgery;  Laterality: N/A;   TRANSTHORACIC ECHOCARDIOGRAM  12/2013   EF 57-84%, grade 1 diastolic dysfunction, high ventricular filling pressure; severe AV stenosis; calcified MV annulus; LA & RA mildly dilated; atrial septal aneurysm    Current Outpatient Medications  Medication Sig Dispense Refill   amLODipine (NORVASC) 10 MG tablet Take 10 mg by mouth daily with breakfast.   6   amoxicillin (AMOXIL) 500 MG capsule Take 4 tablets I hour before dental procedure 4 capsule 0   benazepril (LOTENSIN) 20 MG tablet Take 20 mg by mouth daily with breakfast.   6   carvedilol (COREG) 6.25 MG tablet Take 6.25 mg by mouth 2 (two) times daily with a meal.     clopidogrel (PLAVIX) 75 MG tablet Take 75 mg by mouth daily.      levothyroxine (SYNTHROID, LEVOTHROID) 100 MCG tablet Take 100 mcg by mouth at  bedtime.  1   Lutein 6 MG CAPS Take 6 mg by mouth daily with breakfast.      metFORMIN (GLUCOPHAGE-XR) 500 MG 24 hr tablet Take 1 tablet by mouth 2 (two) times daily.   1   simvastatin (ZOCOR) 20 MG tablet Take 20 mg by mouth every evening.      No current facility-administered medications for this visit.     Allergies:   Patient has no known allergies.   Social History: Social History   Socioeconomic History   Marital status: Married    Spouse name: Not on file    Number of children: 2   Years of education: GED   Highest education level: Not on file  Occupational History   Not on file  Social Needs   Financial resource strain: Not on file   Food insecurity    Worry: Not on file    Inability: Not on file   Transportation needs    Medical: Not on file    Non-medical: Not on file  Tobacco Use   Smoking status: Former Smoker    Packs/day: 1.00    Years: 5.00    Pack years: 5.00    Types: Cigarettes    Quit date: 07/20/1973    Years since quitting: 46.3   Smokeless tobacco: Never Used  Substance and Sexual Activity   Alcohol use: No   Drug use: No   Sexual activity: Not on file  Lifestyle   Physical activity    Days per week: Not on file    Minutes per session: Not on file   Stress: Not on file  Relationships   Social connections    Talks on phone: Not on file    Gets together: Not on file    Attends religious service: Not on file    Active member of club or organization: Not on file    Attends meetings of clubs or organizations: Not on file    Relationship status: Not on file   Intimate partner violence    Fear of current or ex partner: Not on file    Emotionally abused: Not on file    Physically abused: Not on file    Forced sexual activity: Not on file  Other Topics Concern   Not on file  Social History Narrative   Not on file    Family History: Family History  Problem Relation Age of Onset   Lung disease Mother    Diabetes Mother    Colon cancer Father    Diabetes Sister    CAD Sister      Review of Systems: All other systems reviewed and are otherwise negative except as noted above.  Physical Exam: Vitals:   11/27/19 1001  BP: 120/62  Pulse: 68  SpO2: 100%  Weight: 177 lb (80.3 kg)  Height: 5\' 10"  (1.778 m)     GEN- The patient is well appearing, alert and oriented x 3 today.   HEENT: normocephalic, atraumatic; sclera clear, conjunctiva pink; hearing intact; oropharynx clear; neck  supple  Lungs- Clear to ausculation bilaterally, normal work of breathing.  No wheezes, rales, rhonchi Heart- Regular rate and rhythm, no murmurs, rubs or gallops  GI- soft, non-tender, non-distended, bowel sounds present  Extremities- no clubbing, cyanosis, or edema  MS- no significant deformity or atrophy Skin- warm and dry, no rash or lesion; PPM pocket well healed Psych- euthymic mood, full affect Neuro- strength and sensation are intact  PPM Interrogation- reviewed in detail today,  See PACEART report  EKG:  EKG is not ordered today. The ekg ordered 11/22/2019 shows with a pacing at 61 bpm  Recent Labs: No results found for requested labs within last 8760 hours.   Wt Readings from Last 3 Encounters:  11/27/19 177 lb (80.3 kg)  11/22/19 171 lb (77.6 kg)  11/21/18 177 lb 3.2 oz (80.4 kg)     Other studies Reviewed: Additional studies/ records that were reviewed today include: Echo 11/2018 shows LVEF 60-65%, Previous EP office notes, Previous remote checks.  Assessment and Plan:  1.  Symptomatic bradycardia due to Advanced AV block s/p Biotronik PPM  Normal PPM function See Pace Art report Pt is not dependent this am, with sinus (AS-VS in the 70s) No changes today  2. AS s/p TAVR 2017 Stable appearing on Echo 11/2018. Repeat pending  3. HTN Stable on current regimen.  Current medicines are reviewed at length with the patient today.   The patient does not have concerns regarding his medicines.  The following changes were made today:  none  Labs/ tests ordered today include:  No orders of the defined types were placed in this encounter.   Disposition:   Follow up with Dr. Ladona Ridgel in 12 Months   Signed, Luane School  11/27/2019 10:15 AM  [facility] [address] [place] [address] [phone] (office) [phone] (fax)

## 2019-11-27 NOTE — Patient Instructions (Signed)
Medication Instructions:  none *If you need a refill on your cardiac medications before your next appointment, please call your pharmacy*  Lab Work: none If you have labs (blood work) drawn today and your tests are completely normal, you will receive your results only by: Marland Kitchen MyChart Message (if you have MyChart) OR . A paper copy in the mail If you have any lab test that is abnormal or we need to change your treatment, we will call you to review the results.  Testing/Procedures: none  Follow-Up: At [facility], you and your health needs are our priority.  As part of our continuing mission to provide you with exceptional heart care, we have created designated Provider Care Teams.  These Care Teams include your primary Cardiologist (physician) and Advanced Practice Providers (APPs -  Physician Assistants and Nurse Practitioners) who all work together to provide you with the care you need, when you need it.  Your next appointment:   1 year(s)  The format for your next appointment:   Either In Person or Virtual  Provider:   Dr Lovena Le  Other Instructions Remote monitoring is used to monitor your Pacemaker  from home. This monitoring reduces the number of office visits required to check your device to one time per year. It allows [place] to keep an eye on the functioning of your device to ensure it is working properly. You are scheduled for a device check from home on 01/16/20. You may send your transmission at any time that day. If you have a wireless device, the transmission will be sent automatically. After your physician reviews your transmission, you will receive a postcard with your next transmission date.

## 2019-12-01 ENCOUNTER — Other Ambulatory Visit: Payer: Self-pay

## 2019-12-01 ENCOUNTER — Ambulatory Visit (HOSPITAL_COMMUNITY): Payer: Medicare Other | Attending: Cardiovascular Disease

## 2019-12-01 DIAGNOSIS — I6523 Occlusion and stenosis of bilateral carotid arteries: Secondary | ICD-10-CM | POA: Diagnosis not present

## 2019-12-01 DIAGNOSIS — I10 Essential (primary) hypertension: Secondary | ICD-10-CM | POA: Diagnosis not present

## 2019-12-01 DIAGNOSIS — I2510 Atherosclerotic heart disease of native coronary artery without angina pectoris: Secondary | ICD-10-CM

## 2019-12-04 ENCOUNTER — Encounter: Payer: Self-pay | Admitting: *Deleted

## 2019-12-06 ENCOUNTER — Other Ambulatory Visit: Payer: Self-pay | Admitting: Cardiovascular Disease

## 2019-12-06 ENCOUNTER — Ambulatory Visit (HOSPITAL_COMMUNITY)
Admission: RE | Admit: 2019-12-06 | Discharge: 2019-12-06 | Disposition: A | Discharge status: Home / Self Care | Payer: Medicare Other | Source: Ambulatory Visit | Attending: Cardiology | Admitting: Cardiology

## 2019-12-06 ENCOUNTER — Other Ambulatory Visit: Payer: Self-pay

## 2019-12-06 DIAGNOSIS — I2510 Atherosclerotic heart disease of native coronary artery without angina pectoris: Secondary | ICD-10-CM | POA: Diagnosis not present

## 2019-12-06 DIAGNOSIS — I6523 Occlusion and stenosis of bilateral carotid arteries: Secondary | ICD-10-CM | POA: Insufficient documentation

## 2019-12-06 DIAGNOSIS — I10 Essential (primary) hypertension: Secondary | ICD-10-CM | POA: Insufficient documentation

## 2020-01-16 ENCOUNTER — Ambulatory Visit (INDEPENDENT_AMBULATORY_CARE_PROVIDER_SITE_OTHER): Payer: Medicare Other | Admitting: *Deleted

## 2020-01-16 DIAGNOSIS — I447 Left bundle-branch block, unspecified: Secondary | ICD-10-CM | POA: Diagnosis not present

## 2020-01-17 LAB — CUP PACEART REMOTE DEVICE CHECK
Date Time Interrogation Session: 20210126185829
Implantable Lead Implant Date: 20171116
Implantable Lead Implant Date: 20171116
Implantable Lead Location: 753859
Implantable Lead Location: 753860
Implantable Lead Model: 377
Implantable Lead Model: 377
Implantable Lead Serial Number: 49591424
Implantable Lead Serial Number: 49679947
Implantable Pulse Generator Implant Date: 20171116
Pulse Gen Model: 407145
Pulse Gen Serial Number: 68853186

## 2020-02-26 DIAGNOSIS — N183 Chronic kidney disease, stage 3 unspecified: Secondary | ICD-10-CM | POA: Diagnosis not present

## 2020-02-26 DIAGNOSIS — I10 Essential (primary) hypertension: Secondary | ICD-10-CM | POA: Diagnosis not present

## 2020-02-26 DIAGNOSIS — E785 Hyperlipidemia, unspecified: Secondary | ICD-10-CM | POA: Diagnosis not present

## 2020-02-26 DIAGNOSIS — E1122 Type 2 diabetes mellitus with diabetic chronic kidney disease: Secondary | ICD-10-CM | POA: Diagnosis not present

## 2020-03-08 DIAGNOSIS — H52223 Regular astigmatism, bilateral: Secondary | ICD-10-CM | POA: Diagnosis not present

## 2020-03-08 DIAGNOSIS — H35371 Puckering of macula, right eye: Secondary | ICD-10-CM | POA: Diagnosis not present

## 2020-04-16 ENCOUNTER — Ambulatory Visit (INDEPENDENT_AMBULATORY_CARE_PROVIDER_SITE_OTHER): Payer: Medicare Other | Admitting: *Deleted

## 2020-04-16 DIAGNOSIS — I447 Left bundle-branch block, unspecified: Secondary | ICD-10-CM

## 2020-04-16 LAB — CUP PACEART REMOTE DEVICE CHECK
Date Time Interrogation Session: 20210427082156
Implantable Lead Implant Date: 20171116
Implantable Lead Implant Date: 20171116
Implantable Lead Location: 753859
Implantable Lead Location: 753860
Implantable Lead Model: 377
Implantable Lead Model: 377
Implantable Lead Serial Number: 49591424
Implantable Lead Serial Number: 49679947
Implantable Pulse Generator Implant Date: 20171116
Pulse Gen Model: 407145
Pulse Gen Serial Number: 68853186

## 2020-04-17 NOTE — Progress Notes (Signed)
PPM Remote  

## 2020-05-09 DIAGNOSIS — Z0120 Encounter for dental examination and cleaning without abnormal findings: Secondary | ICD-10-CM | POA: Diagnosis not present

## 2020-07-16 ENCOUNTER — Ambulatory Visit (INDEPENDENT_AMBULATORY_CARE_PROVIDER_SITE_OTHER): Payer: Medicare Other | Admitting: *Deleted

## 2020-07-16 DIAGNOSIS — I459 Conduction disorder, unspecified: Secondary | ICD-10-CM

## 2020-07-17 LAB — CUP PACEART REMOTE DEVICE CHECK
Date Time Interrogation Session: 20210727165616
Implantable Lead Implant Date: 20171116
Implantable Lead Implant Date: 20171116
Implantable Lead Location: 753859
Implantable Lead Location: 753860
Implantable Lead Model: 377
Implantable Lead Model: 377
Implantable Lead Serial Number: 49591424
Implantable Lead Serial Number: 49679947
Implantable Pulse Generator Implant Date: 20171116
Pulse Gen Model: 407145
Pulse Gen Serial Number: 68853186

## 2020-07-19 NOTE — Progress Notes (Signed)
Remote pacemaker transmission.   

## 2020-08-12 DIAGNOSIS — E1122 Type 2 diabetes mellitus with diabetic chronic kidney disease: Secondary | ICD-10-CM | POA: Diagnosis not present

## 2020-08-12 DIAGNOSIS — Z950 Presence of cardiac pacemaker: Secondary | ICD-10-CM | POA: Diagnosis not present

## 2020-08-12 DIAGNOSIS — N183 Chronic kidney disease, stage 3 unspecified: Secondary | ICD-10-CM | POA: Diagnosis not present

## 2020-08-12 DIAGNOSIS — I259 Chronic ischemic heart disease, unspecified: Secondary | ICD-10-CM | POA: Diagnosis not present

## 2020-09-26 DIAGNOSIS — H35371 Puckering of macula, right eye: Secondary | ICD-10-CM | POA: Diagnosis not present

## 2020-09-26 DIAGNOSIS — E119 Type 2 diabetes mellitus without complications: Secondary | ICD-10-CM | POA: Diagnosis not present

## 2020-09-26 DIAGNOSIS — H02139 Senile ectropion of unspecified eye, unspecified eyelid: Secondary | ICD-10-CM | POA: Diagnosis not present

## 2020-09-26 DIAGNOSIS — H33102 Unspecified retinoschisis, left eye: Secondary | ICD-10-CM | POA: Diagnosis not present

## 2020-10-15 ENCOUNTER — Ambulatory Visit (INDEPENDENT_AMBULATORY_CARE_PROVIDER_SITE_OTHER): Payer: Medicare Other

## 2020-10-15 DIAGNOSIS — I447 Left bundle-branch block, unspecified: Secondary | ICD-10-CM | POA: Diagnosis not present

## 2020-10-15 LAB — CUP PACEART REMOTE DEVICE CHECK
Date Time Interrogation Session: 20211026092709
Implantable Lead Implant Date: 20171116
Implantable Lead Implant Date: 20171116
Implantable Lead Location: 753859
Implantable Lead Location: 753860
Implantable Lead Model: 377
Implantable Lead Model: 377
Implantable Lead Serial Number: 49591424
Implantable Lead Serial Number: 49679947
Implantable Pulse Generator Implant Date: 20171116
Pulse Gen Model: 407145
Pulse Gen Serial Number: 68853186

## 2020-10-21 DIAGNOSIS — Z23 Encounter for immunization: Secondary | ICD-10-CM | POA: Diagnosis not present

## 2020-10-21 NOTE — Progress Notes (Signed)
Remote pacemaker transmission.   

## 2020-11-21 DIAGNOSIS — Z0120 Encounter for dental examination and cleaning without abnormal findings: Secondary | ICD-10-CM | POA: Diagnosis not present

## 2020-11-22 ENCOUNTER — Other Ambulatory Visit: Payer: Self-pay

## 2020-11-22 ENCOUNTER — Ambulatory Visit: Payer: Medicare Other | Admitting: Cardiovascular Disease

## 2020-11-22 ENCOUNTER — Encounter: Payer: Self-pay | Admitting: Cardiovascular Disease

## 2020-11-22 VITALS — BP 152/61 | HR 68 | Ht 70.0 in | Wt 170.2 lb

## 2020-11-22 DIAGNOSIS — E782 Mixed hyperlipidemia: Secondary | ICD-10-CM

## 2020-11-22 DIAGNOSIS — Z952 Presence of prosthetic heart valve: Secondary | ICD-10-CM

## 2020-11-22 DIAGNOSIS — I10 Essential (primary) hypertension: Secondary | ICD-10-CM | POA: Diagnosis not present

## 2020-11-22 DIAGNOSIS — I459 Conduction disorder, unspecified: Secondary | ICD-10-CM | POA: Diagnosis not present

## 2020-11-22 DIAGNOSIS — I2510 Atherosclerotic heart disease of native coronary artery without angina pectoris: Secondary | ICD-10-CM | POA: Diagnosis not present

## 2020-11-22 DIAGNOSIS — I350 Nonrheumatic aortic (valve) stenosis: Secondary | ICD-10-CM

## 2020-11-22 DIAGNOSIS — I6523 Occlusion and stenosis of bilateral carotid arteries: Secondary | ICD-10-CM

## 2020-11-22 NOTE — Patient Instructions (Signed)
Medication Instructions:  Your physician recommends that you continue on your current medications as directed. Please refer to the Current Medication list given to you today.  *If you need a refill on your cardiac medications before your next appointment, please call your pharmacy*  Testing/Procedures: Your physician has requested that you have an echocardiogram. Echocardiography is a painless test that uses sound waves to create images of your heart. It provides your doctor with information about the size and shape of your heart and how well your heart's chambers and valves are working. This procedure takes approximately one hour. There are no restrictions for this procedure.  Follow-Up: At [facility], you and your health needs are our priority.  As part of our continuing mission to provide you with exceptional heart care, we have created designated Provider Care Teams.  These Care Teams include your primary Cardiologist (physician) and Advanced Practice Providers (APPs -  Physician Assistants and Nurse Practitioners) who all work together to provide you with the care you need, when you need it.  We recommend signing up for the patient portal called "MyChart".  Sign up information is provided on this After Visit Summary.  MyChart is used to connect with patients for Virtual Visits (Telemedicine).  Patients are able to view lab/test results, encounter notes, upcoming appointments, etc.  Non-urgent messages can be sent to your provider as well.   To learn more about what you can do with MyChart, go to [email or web address].    Your next appointment:   12 month(s)  The format for your next appointment:   In Person  Provider:   Jonathan Berry, MD      

## 2020-11-22 NOTE — Progress Notes (Signed)
11/22/2020 XHAIDEN COOMBS   [birth year]  [id number]  Primary Physician Street, Stephanie Coup, MD Primary Cardiologist: Runell Gess MD FACP, [place], [place], [place]  HPI:  NEEDHAM BIGGINS is a 86 y.o.  mildly overweight, married Caucasian male, father of 2, grandfather to 1 grandchild who I last saw  11/22/2019. He is accompanied by his wife today. He has a history of CAD status post coronary artery bypass grafting in 2000, with a LIMA to his LAD, a vein graft to the first obtuse marginal branch, second obtuse marginal branch and to the RCA. His other problems include hypertension, hyperlipidemia and noninsulin-requiring diabetes. He does have bilateral internal carotid artery stenosis left greater than right which has remained stable over the last 6 months. He is neurologically asymptomatic. He also has mild aortic stenosis with valve area of 1.4 cm squared. His last Myoview performed October 06, 2011, was low risk. He is asymptomatic. Dr. Sheria Lang follows his lipid profile. He has had progressive aortic stenosis with his most recent echo performed 02/28/16 were revealing preserved LV function . He also has moderate bilateral ICA stenosis by duplex ultrasound. When I saw him back in October of last year his dyspnea had progressed as had his aortic stenosis. I performed right and left heart cath on him 10/05/16 revealing patent grafts and he ultimately underwent TAV are [place] CPE in 3 valve implantation by Drs. 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 Ladona Ridgel this follow-up 2-D echo performed 12/10/16 revealed normal LV function with a well-functioning bioprosthesis. He feels clinically improved, denies shortness of breath and has more energy.  Since I saw him  a year ago he remains stable.  He denies chest pain or shortness of breath.  His 2D echo performed a year ago showed normal LV function  with a well-functioning aortic bioprosthesis and carotid Dopplers revealed moderate and stable bilateral ICA stenosis.    Current Meds  Medication Sig  . amLODipine (NORVASC) 10 MG tablet Take 10 mg by mouth daily with breakfast.   . amoxicillin (AMOXIL) 500 MG capsule Take 4 tablets I hour before dental procedure  . benazepril (LOTENSIN) 20 MG tablet Take 20 mg by mouth daily with breakfast.   . carvedilol (COREG) 6.25 MG tablet Take 6.25 mg by mouth 2 (two) times daily with a meal.  . clopidogrel (PLAVIX) 75 MG tablet Take 75 mg by mouth daily.   Marland Kitchen levothyroxine (SYNTHROID, LEVOTHROID) 100 MCG tablet Take 100 mcg by mouth at bedtime.  . Lutein 6 MG CAPS Take 6 mg by mouth daily with breakfast.   . metFORMIN (GLUCOPHAGE-XR) 500 MG 24 hr tablet Take 1 tablet by mouth 2 (two) times daily.   . simvastatin (ZOCOR) 20 MG tablet Take 20 mg by mouth every evening.      No Known Allergies  Social History   Socioeconomic History  . Marital status: Married    Spouse name: Not on file  . Number of children: 2  . Years of education: GED  . Highest education level: Not on file  Occupational History  . Not on file  Tobacco Use  . Smoking status: Former Smoker    Packs/day: 1.00    Years: 5.00    Pack years: 5.00    Types: Cigarettes    Quit date: 07/20/1973    Years since quitting: 47.3  . Smokeless tobacco: Never Used  Vaping Use  . Vaping  Use: Never used  Substance and Sexual Activity  . Alcohol use: No  . Drug use: No  . Sexual activity: Not on file  Other Topics Concern  . Not on file  Social History Narrative  . Not on file   Social Determinants of Health   Financial Resource Strain:   . Difficulty of Paying Living Expenses: Not on file  Food Insecurity:   . Worried About Programme researcher, broadcasting/film/video in the Last Year: Not on file  . Ran Out of Food in the Last Year: Not on file  Transportation Needs:   . Lack of Transportation (Medical): Not on file  . Lack of Transportation  (Non-Medical): Not on file  Physical Activity:   . Days of Exercise per Week: Not on file  . Minutes of Exercise per Session: Not on file  Stress:   . Feeling of Stress : Not on file  Social Connections:   . Frequency of Communication with Friends and Family: Not on file  . Frequency of Social Gatherings with Friends and Family: Not on file  . Attends Religious Services: Not on file  . Active Member of Clubs or Organizations: Not on file  . Attends Banker Meetings: Not on file  . Marital Status: Not on file  Intimate Partner Violence:   . Fear of Current or Ex-Partner: Not on file  . Emotionally Abused: Not on file  . Physically Abused: Not on file  . Sexually Abused: Not on file     Review of Systems: General: negative for chills, fever, night sweats or weight changes.  Cardiovascular: negative for chest pain, dyspnea on exertion, edema, orthopnea, palpitations, paroxysmal nocturnal dyspnea or shortness of breath Dermatological: negative for rash Respiratory: negative for cough or wheezing Urologic: negative for hematuria Abdominal: negative for nausea, vomiting, diarrhea, bright red blood per rectum, melena, or hematemesis Neurologic: negative for visual changes, syncope, or dizziness All other systems reviewed and are otherwise negative except as noted above.    Blood pressure (!) 152/61, pulse 68, height 5\' 10"  (1.778 m), weight 170 lb 3.2 oz (77.2 kg), SpO2 99 %.  General appearance: alert and no distress Neck: no adenopathy, no JVD, supple, symmetrical, trachea midline, thyroid not enlarged, symmetric, no tenderness/mass/nodules and Bilateral carotid bruits right louder than left Lungs: clear to auscultation bilaterally Heart: 2/6 outflow tract murmur consistent with aortic stenosis. Extremities: extremities normal, atraumatic, no cyanosis or edema Pulses: 2+ and symmetric Skin: Skin color, texture, turgor normal. No rashes or lesions Neurologic: Alert and  oriented X 3, normal strength and tone. Normal symmetric reflexes. Normal coordination and gait  EKG sinus rhythm at 68 with nonspecific ST and T wave changes.  I personally reviewed this EKG.  ASSESSMENT AND PLAN:   Carotid stenosis, bil, last dopplers 12/2013 50-69% History of moderate bilateral ICA stenosis by duplex ultrasound performed 12/06/2019.  This will be repeated on annual basis.  Essential hypertension History of essential hypertension blood pressure measured today at 152/61.  He is on amlodipine, benazepril and carvedilol.  Hyperlipidemia History of hyperlipidemia on statin therapy with lipid profile performed 08/12/2020 revealing total cholesterol 129, LDL 64 and HDL 46.  Coronary artery disease History of coronary bypass grafting in 2000 with a LIMA to the LAD, vein to the first obtuse marginal branch, second obtuse marginal branch and to the RCA.  I performed right left heart cath on him 10/05/2016 revealing patent grafts and normal LV function.  He denies chest pain or shortness of  breath.  Severe aortic stenosis History of severe aortic stenosis status post TAVR by Drs. Cooper and [place] 11/03/2016 after cardiac catheterization performed by myself 10/05/2016 revealed patent grafts.  He did well after that.  His most recent 2D echo performed 12/01/2019 revealed normal LV systolic function with a well-functioning aortic bioprosthesis.  This will be repeated on annual basis.  Heart block s/p Biotronic PPM 10/2016 History of transient heart block status post permanent transvenous pacemaker insertion by Dr. Ladona Ridgel which he follows.      Runell Gess MD FACP,FACC,FAHA, [facility] 11/22/2020 1:51 PM

## 2020-11-22 NOTE — Assessment & Plan Note (Signed)
History of coronary bypass grafting in 2000 with a LIMA to the LAD, vein to the first obtuse marginal branch, second obtuse marginal branch and to the RCA.  I performed right left heart cath on him 10/05/2016 revealing patent grafts and normal LV function.  He denies chest pain or shortness of breath.

## 2020-11-22 NOTE — Assessment & Plan Note (Signed)
History of transient heart block status post permanent transvenous pacemaker insertion by Dr. Ladona Ridgel which he follows.

## 2020-11-22 NOTE — Assessment & Plan Note (Signed)
History of moderate bilateral ICA stenosis by duplex ultrasound performed 12/06/2019.  This will be repeated on annual basis.

## 2020-11-22 NOTE — Assessment & Plan Note (Signed)
History of essential hypertension blood pressure measured today at 152/61.  He is on amlodipine, benazepril and carvedilol.

## 2020-11-22 NOTE — Assessment & Plan Note (Signed)
History of severe aortic stenosis status post TAVR by Drs. Cooper and [place] 11/03/2016 after cardiac catheterization performed by myself 10/05/2016 revealed patent grafts.  He did well after that.  His most recent 2D echo performed 12/01/2019 revealed normal LV systolic function with a well-functioning aortic bioprosthesis.  This will be repeated on annual basis.

## 2020-11-22 NOTE — Assessment & Plan Note (Signed)
History of hyperlipidemia on statin therapy with lipid profile performed 08/12/2020 revealing total cholesterol 129, LDL 64 and HDL 46.

## 2020-11-26 ENCOUNTER — Encounter: Payer: Self-pay | Admitting: Internal Medicine

## 2020-11-26 ENCOUNTER — Other Ambulatory Visit: Payer: Self-pay

## 2020-11-26 ENCOUNTER — Ambulatory Visit: Payer: Medicare Other | Admitting: Internal Medicine

## 2020-11-26 VITALS — BP 136/62 | HR 66 | Ht 70.0 in | Wt 172.2 lb

## 2020-11-26 DIAGNOSIS — I10 Essential (primary) hypertension: Secondary | ICD-10-CM

## 2020-11-26 DIAGNOSIS — I442 Atrioventricular block, complete: Secondary | ICD-10-CM | POA: Insufficient documentation

## 2020-11-26 DIAGNOSIS — Z950 Presence of cardiac pacemaker: Secondary | ICD-10-CM

## 2020-11-26 NOTE — Progress Notes (Signed)
HPI Thomas English returns today for ongoing evaluation and management of his PPM. He developed CHB after TAVR. He has done well, s/p PPM insertion over 3 years ago. He denies chest pain or sob. His wife notes that he is sedentary. He states he is not walking at [facility] due to Covid. He has not had edema. No Known Allergies   Current Outpatient Medications  Medication Sig Dispense Refill  . amLODipine (NORVASC) 10 MG tablet Take 10 mg by mouth daily with breakfast.   6  . amoxicillin (AMOXIL) 500 MG capsule Take 4 tablets I hour before dental procedure 4 capsule 0  . benazepril (LOTENSIN) 20 MG tablet Take 20 mg by mouth daily with breakfast.   6  . carvedilol (COREG) 6.25 MG tablet Take 6.25 mg by mouth 2 (two) times daily with a meal.    . clopidogrel (PLAVIX) 75 MG tablet Take 75 mg by mouth daily.     Marland Kitchen levothyroxine (SYNTHROID, LEVOTHROID) 100 MCG tablet Take 100 mcg by mouth at bedtime.  1  . Lutein 6 MG CAPS Take 6 mg by mouth daily with breakfast.     . metFORMIN (GLUCOPHAGE-XR) 500 MG 24 hr tablet Take 1 tablet by mouth 2 (two) times daily.   1  . simvastatin (ZOCOR) 20 MG tablet Take 20 mg by mouth every evening.      No current facility-administered medications for this visit.     Past Medical History:  Diagnosis Date  . Aortic stenosis    a. s/p TAVR on 11/03/16  . Arthritis   . CAD (coronary artery disease)    a. CAD s/p CABG 2000   b. cath on 10/05/16 with severe native vessel CAD and patent bypass grafts  . Carotid stenosis   . Diabetes mellitus type 2, noninsulin dependent (HCC)   . Heart block AV second degree    a. 10/2016: after TAVR. s/p Biotrionic PPM: (serial number [id number])   . Hyperlipidemia   . Hypertension   . Hypothyroidism     ROS:   All systems reviewed and negative except as noted in the HPI.   Past Surgical History:  Procedure Laterality Date  . BACK SURGERY    . CARDIAC CATHETERIZATION  11/10/1999   3-vessel disease, recommendation  for CABG  . CARDIAC CATHETERIZATION N/A 10/05/2016   Procedure: Right/Left Heart Cath and Coronary/Graft Angiography;  Surgeon: Runell Gess, MD;  Location: [facility] INVASIVE CV LAB;  Service: Cardiovascular;  Laterality: N/A;  . CORONARY ARTERY BYPASS GRAFT  11/13/1999   LIMA to LAD and a vein graft to the first OM branch, second OM branch, and to the RCA (Dr. Wayland Salinas)  . EP IMPLANTABLE DEVICE N/A 11/05/2016   Procedure: Pacemaker Implant;  Surgeon: Marinus Maw, MD;  Location: [facility] INVASIVE CV LAB;  Service: Cardiovascular;  Laterality: N/A;  . EYE SURGERY     eye duct surgery  . NM MYOCAR PERF WALL MOTION  09/2013   normal lexiscan myoview  . PERIPHERAL VASCULAR CATHETERIZATION N/A 10/05/2016   Procedure: Abdominal Aortogram;  Surgeon: Runell Gess, MD;  Location: [facility] INVASIVE CV LAB;  Service: Cardiovascular;  Laterality: N/A;  . TEE WITHOUT CARDIOVERSION N/A 11/03/2016   Procedure: TRANSESOPHAGEAL ECHOCARDIOGRAM (TEE);  Surgeon: Tonny Bollman, MD;  Location: [facility] OR;  Service: Open Heart Surgery;  Laterality: N/A;  . TRANSCATHETER AORTIC VALVE REPLACEMENT, TRANSFEMORAL N/A 11/03/2016   Procedure: TRANSCATHETER AORTIC VALVE REPLACEMENT, TRANSFEMORAL;  Surgeon: Tonny Bollman, MD;  Location:  [facility] OR;  Service: Open Heart Surgery;  Laterality: N/A;  . TRANSTHORACIC ECHOCARDIOGRAM  12/2013   EF 55-60%, grade 1 diastolic dysfunction, high ventricular filling pressure; severe AV stenosis; calcified MV annulus; LA & RA mildly dilated; atrial septal aneurysm     Family History  Problem Relation Age of Onset  . Lung disease Mother   . Diabetes Mother   . Colon cancer Father   . Diabetes Sister   . CAD Sister      Social History   Socioeconomic History  . Marital status: Married    Spouse name: Not on file  . Number of children: 2  . Years of education: GED  . Highest education level: Not on file  Occupational History  . Not on file  Tobacco Use  . Smoking status: Former Smoker      Packs/day: 1.00    Years: 5.00    Pack years: 5.00    Types: Cigarettes    Quit date: 07/20/1973    Years since quitting: 47.3  . Smokeless tobacco: Never Used  Vaping Use  . Vaping Use: Never used  Substance and Sexual Activity  . Alcohol use: No  . Drug use: No  . Sexual activity: Not on file  Other Topics Concern  . Not on file  Social History Narrative  . Not on file   Social Determinants of Health   Financial Resource Strain:   . Difficulty of Paying Living Expenses: Not on file  Food Insecurity:   . Worried About Programme researcher, broadcasting/film/video in the Last Year: Not on file  . Ran Out of Food in the Last Year: Not on file  Transportation Needs:   . Lack of Transportation (Medical): Not on file  . Lack of Transportation (Non-Medical): Not on file  Physical Activity:   . Days of Exercise per Week: Not on file  . Minutes of Exercise per Session: Not on file  Stress:   . Feeling of Stress : Not on file  Social Connections:   . Frequency of Communication with Friends and Family: Not on file  . Frequency of Social Gatherings with Friends and Family: Not on file  . Attends Religious Services: Not on file  . Active Member of Clubs or Organizations: Not on file  . Attends Banker Meetings: Not on file  . Marital Status: Not on file  Intimate Partner Violence:   . Fear of Current or Ex-Partner: Not on file  . Emotionally Abused: Not on file  . Physically Abused: Not on file  . Sexually Abused: Not on file     BP 136/62   Pulse 66   Ht 5\' 10"  (1.778 m)   Wt 172 lb 3.2 oz (78.1 kg)   SpO2 98%   BMI 24.71 kg/m   Physical Exam:  Well appearing elderly man, NAD HEENT: Unremarkable Neck:  6 cm JVD, no thyromegally Lymphatics:  No adenopathy Back:  No CVA tenderness Lungs:  Clear with no wheezes HEART:  Regular rate rhythm, no murmurs, no rubs, no clicks Abd:  soft, positive bowel sounds, no organomegally, no rebound, no guarding Ext:  2 plus pulses, no  edema, no cyanosis, no clubbing Skin:  No rashes no nodules Neuro:  CN II through XII intact, motor grossly intact  DEVICE  Normal device function.  See PaceArt for details.   Assess/Plan: 1. CHB - he is only pacing 1/3 of the time. His conduction has improved since his TAVR. 2. AS -  he is s/p TAVR and his exam is reassuring today. 3. HTN - his bp is improved today. 4. PPM - his biotronik DDD PM is working normally.   Lewayne Bunting, MD

## 2020-11-26 NOTE — Patient Instructions (Signed)
Medication Instructions:  Your physician recommends that you continue on your current medications as directed. Please refer to the Current Medication list given to you today.  Labwork: None ordered.  Testing/Procedures: None ordered.  Follow-Up: Your physician wants you to follow-up in: one year with Dr. Ladona Ridgel.  You will receive a reminder letter in the mail two months in advance. If you don't receive a letter, please call our office to schedule the follow-up appointment.  Remote monitoring is used to monitor your Pacemaker from home. This monitoring reduces the number of office visits required to check your device to one time per year. It allows [place] to keep an eye on the functioning of your device to ensure it is working properly. You are scheduled for a device check from home on 01/14/2021. You may send your transmission at any time that day. If you have a wireless device, the transmission will be sent automatically. After your physician reviews your transmission, you will receive a postcard with your next transmission date.  Any Other Special Instructions Will Be Listed Below (If Applicable).  If you need a refill on your cardiac medications before your next appointment, please call your pharmacy.

## 2020-12-09 ENCOUNTER — Ambulatory Visit (HOSPITAL_COMMUNITY)
Admission: RE | Admit: 2020-12-09 | Discharge: 2020-12-09 | Disposition: A | Discharge status: Home / Self Care | Payer: Medicare Other | Source: Ambulatory Visit | Attending: Cardiovascular Disease | Admitting: Cardiovascular Disease

## 2020-12-09 ENCOUNTER — Other Ambulatory Visit: Payer: Self-pay

## 2020-12-09 ENCOUNTER — Other Ambulatory Visit (HOSPITAL_COMMUNITY): Payer: Self-pay | Admitting: Cardiovascular Disease

## 2020-12-09 DIAGNOSIS — I6523 Occlusion and stenosis of bilateral carotid arteries: Secondary | ICD-10-CM

## 2020-12-09 DIAGNOSIS — I2510 Atherosclerotic heart disease of native coronary artery without angina pectoris: Secondary | ICD-10-CM | POA: Diagnosis not present

## 2020-12-09 DIAGNOSIS — I10 Essential (primary) hypertension: Secondary | ICD-10-CM

## 2020-12-16 ENCOUNTER — Other Ambulatory Visit: Payer: Self-pay

## 2020-12-16 ENCOUNTER — Ambulatory Visit (HOSPITAL_COMMUNITY): Payer: Medicare Other | Attending: Cardiology

## 2020-12-16 DIAGNOSIS — I350 Nonrheumatic aortic (valve) stenosis: Secondary | ICD-10-CM

## 2020-12-16 DIAGNOSIS — Z952 Presence of prosthetic heart valve: Secondary | ICD-10-CM

## 2020-12-16 LAB — ECHOCARDIOGRAM COMPLETE
AR max vel: 2.18 cm2
AV Area VTI: 2.47 cm2
AV Area mean vel: 2.54 cm2
AV Mean grad: 7 mmHg
AV Peak grad: 15.3 mmHg
Ao pk vel: 1.96 m/s
Area-P 1/2: 2.95 cm2
S' Lateral: 2.4 cm

## 2021-01-14 ENCOUNTER — Ambulatory Visit: Payer: Medicare Other

## 2021-01-14 LAB — CUP PACEART REMOTE DEVICE CHECK
Date Time Interrogation Session: 20220125072517
Implantable Lead Implant Date: 20171116
Implantable Lead Implant Date: 20171116
Implantable Lead Location: 753859
Implantable Lead Location: 753860
Implantable Lead Model: 377
Implantable Lead Model: 377
Implantable Lead Serial Number: 49591424
Implantable Lead Serial Number: 49679947
Implantable Pulse Generator Implant Date: 20171116
Pulse Gen Model: 407145
Pulse Gen Serial Number: 68853186

## 2021-03-11 DIAGNOSIS — N1831 Chronic kidney disease, stage 3a: Secondary | ICD-10-CM | POA: Diagnosis not present

## 2021-03-11 DIAGNOSIS — N183 Chronic kidney disease, stage 3 unspecified: Secondary | ICD-10-CM | POA: Diagnosis not present

## 2021-03-11 DIAGNOSIS — E1122 Type 2 diabetes mellitus with diabetic chronic kidney disease: Secondary | ICD-10-CM | POA: Diagnosis not present

## 2021-03-11 DIAGNOSIS — I25119 Atherosclerotic heart disease of native coronary artery with unspecified angina pectoris: Secondary | ICD-10-CM | POA: Diagnosis not present

## 2021-03-21 DIAGNOSIS — L03039 Cellulitis of unspecified toe: Secondary | ICD-10-CM | POA: Diagnosis not present

## 2021-03-27 DIAGNOSIS — H35371 Puckering of macula, right eye: Secondary | ICD-10-CM | POA: Diagnosis not present

## 2021-03-27 DIAGNOSIS — H02139 Senile ectropion of unspecified eye, unspecified eyelid: Secondary | ICD-10-CM | POA: Diagnosis not present

## 2021-03-27 DIAGNOSIS — H33102 Unspecified retinoschisis, left eye: Secondary | ICD-10-CM | POA: Diagnosis not present

## 2021-03-27 DIAGNOSIS — E119 Type 2 diabetes mellitus without complications: Secondary | ICD-10-CM | POA: Diagnosis not present

## 2021-04-03 ENCOUNTER — Other Ambulatory Visit: Payer: Self-pay

## 2021-04-03 ENCOUNTER — Ambulatory Visit: Payer: Medicare Other | Admitting: Podiatry

## 2021-04-03 DIAGNOSIS — L600 Ingrowing nail: Secondary | ICD-10-CM

## 2021-04-03 DIAGNOSIS — L601 Onycholysis: Secondary | ICD-10-CM

## 2021-04-03 DIAGNOSIS — M79676 Pain in unspecified toe(s): Secondary | ICD-10-CM | POA: Diagnosis not present

## 2021-04-03 NOTE — Progress Notes (Signed)
  Subjective:  Patient ID: Thomas English, male    DOB: 1932-05-23,  MRN: [id number]  Chief Complaint  Patient presents with  . Nail Problem    Lt hallux nail bed swelling, redness, and sore and clear oozing x 4 wks; no injury - tender to touch -PCP Rx'd 1 round of abx (helped some) Tx: bandaid and epsom salt and neosporin -per pt it's looking better now    87 y.o. male presents with the above complaint. History confirmed with patient.   Objective:  Physical Exam: warm, good capillary refill, no trophic changes or ulcerative lesions, normal DP and PT pulses and normal sensory exam.  Painful ingrowing nail at proximal aspect of the left, hallux; local warmth noted and local erythema noted  Assessment:   1. Pain around toenail   2. Ingrown nail   3. Onycholysis      Plan:  Patient was evaluated and treated and all questions answered.  Ingrown Nail, left -Patient elects to proceed with ingrown toenail removal today -Ingrown nail excised. See procedure note. -Educated on post-procedure care including soaking. Written instructions provided. -Rx for Cortisporin drops -Patient to follow up in 2 weeks for nail check.

## 2021-04-03 NOTE — Patient Instructions (Signed)

## 2021-04-15 ENCOUNTER — Ambulatory Visit (INDEPENDENT_AMBULATORY_CARE_PROVIDER_SITE_OTHER): Payer: Medicare Other

## 2021-04-15 DIAGNOSIS — I442 Atrioventricular block, complete: Secondary | ICD-10-CM | POA: Diagnosis not present

## 2021-04-15 LAB — CUP PACEART REMOTE DEVICE CHECK
Date Time Interrogation Session: 20220426064332
Implantable Lead Implant Date: 20171116
Implantable Lead Implant Date: 20171116
Implantable Lead Location: 753859
Implantable Lead Location: 753860
Implantable Lead Model: 377
Implantable Lead Model: 377
Implantable Lead Serial Number: 49591424
Implantable Lead Serial Number: 49679947
Implantable Pulse Generator Implant Date: 20171116
Pulse Gen Model: 407145
Pulse Gen Serial Number: 68853186

## 2021-04-17 ENCOUNTER — Encounter: Payer: Self-pay | Admitting: Podiatry

## 2021-04-17 ENCOUNTER — Ambulatory Visit: Payer: Medicare Other | Admitting: Podiatry

## 2021-04-17 ENCOUNTER — Other Ambulatory Visit: Payer: Self-pay

## 2021-04-17 DIAGNOSIS — M79676 Pain in unspecified toe(s): Secondary | ICD-10-CM

## 2021-04-17 DIAGNOSIS — L600 Ingrowing nail: Secondary | ICD-10-CM

## 2021-04-17 NOTE — Progress Notes (Signed)
  Subjective:  Patient ID: Thomas English, male    DOB: 1932-06-27,  MRN: [id number]  Chief Complaint  Patient presents with  . Nail Problem    The left big toenail is doing better and does not hurt    87 y.o. male presents for follow up of nail procedure. History confirmed with patient.   Objective:  Physical Exam: Ingrown nail avulsion site: overlying soft crust, no warmth, no drainage and no erythema Assessment:   1. Ingrown nail   2. Pain around toenail      Plan:  Patient was evaluated and treated and all questions answered.  S/p Ingrown Toenail Excision, right -Healing well without issue. -Discussed return precautions. -F/u PRN

## 2021-05-06 NOTE — Progress Notes (Signed)
Remote pacemaker transmission.   

## 2021-07-15 ENCOUNTER — Ambulatory Visit (INDEPENDENT_AMBULATORY_CARE_PROVIDER_SITE_OTHER): Payer: Medicare Other

## 2021-07-15 DIAGNOSIS — I442 Atrioventricular block, complete: Secondary | ICD-10-CM | POA: Diagnosis not present

## 2021-07-15 LAB — CUP PACEART REMOTE DEVICE CHECK
Date Time Interrogation Session: 20220725105428
Implantable Lead Implant Date: 20171116
Implantable Lead Implant Date: 20171116
Implantable Lead Location: 753859
Implantable Lead Location: 753860
Implantable Lead Model: 377
Implantable Lead Model: 377
Implantable Lead Serial Number: 49591424
Implantable Lead Serial Number: 49679947
Implantable Pulse Generator Implant Date: 20171116
Pulse Gen Model: 407145
Pulse Gen Serial Number: 68853186

## 2021-08-11 NOTE — Progress Notes (Signed)
Remote pacemaker transmission.   

## 2021-09-12 DIAGNOSIS — I25119 Atherosclerotic heart disease of native coronary artery with unspecified angina pectoris: Secondary | ICD-10-CM | POA: Diagnosis not present

## 2021-09-12 DIAGNOSIS — D638 Anemia in other chronic diseases classified elsewhere: Secondary | ICD-10-CM | POA: Diagnosis not present

## 2021-09-12 DIAGNOSIS — I442 Atrioventricular block, complete: Secondary | ICD-10-CM | POA: Diagnosis not present

## 2021-09-12 DIAGNOSIS — N183 Chronic kidney disease, stage 3 unspecified: Secondary | ICD-10-CM | POA: Diagnosis not present

## 2021-09-12 DIAGNOSIS — E1122 Type 2 diabetes mellitus with diabetic chronic kidney disease: Secondary | ICD-10-CM | POA: Diagnosis not present

## 2021-09-12 DIAGNOSIS — D649 Anemia, unspecified: Secondary | ICD-10-CM | POA: Diagnosis not present

## 2021-09-13 DIAGNOSIS — S42301A Unspecified fracture of shaft of humerus, right arm, initial encounter for closed fracture: Secondary | ICD-10-CM | POA: Diagnosis not present

## 2021-09-13 DIAGNOSIS — M79601 Pain in right arm: Secondary | ICD-10-CM | POA: Diagnosis not present

## 2021-09-13 DIAGNOSIS — S50812A Abrasion of left forearm, initial encounter: Secondary | ICD-10-CM | POA: Diagnosis not present

## 2021-09-13 DIAGNOSIS — W19XXXA Unspecified fall, initial encounter: Secondary | ICD-10-CM | POA: Diagnosis not present

## 2021-09-15 DIAGNOSIS — S42294A Other nondisplaced fracture of upper end of right humerus, initial encounter for closed fracture: Secondary | ICD-10-CM | POA: Diagnosis not present

## 2021-09-29 DIAGNOSIS — H33102 Unspecified retinoschisis, left eye: Secondary | ICD-10-CM | POA: Diagnosis not present

## 2021-09-29 DIAGNOSIS — H35371 Puckering of macula, right eye: Secondary | ICD-10-CM | POA: Diagnosis not present

## 2021-09-29 DIAGNOSIS — H02139 Senile ectropion of unspecified eye, unspecified eyelid: Secondary | ICD-10-CM | POA: Diagnosis not present

## 2021-09-29 DIAGNOSIS — E119 Type 2 diabetes mellitus without complications: Secondary | ICD-10-CM | POA: Diagnosis not present

## 2021-10-06 ENCOUNTER — Telehealth: Payer: Self-pay

## 2021-10-06 DIAGNOSIS — S42294A Other nondisplaced fracture of upper end of right humerus, initial encounter for closed fracture: Secondary | ICD-10-CM | POA: Diagnosis not present

## 2021-10-06 NOTE — Telephone Encounter (Signed)
Biotronik alert received for monitor not connected within 21 days. Patient called, advised. States he will plug monitor in.

## 2021-10-14 ENCOUNTER — Ambulatory Visit (INDEPENDENT_AMBULATORY_CARE_PROVIDER_SITE_OTHER): Payer: Medicare Other

## 2021-10-14 DIAGNOSIS — I442 Atrioventricular block, complete: Secondary | ICD-10-CM

## 2021-10-14 LAB — CUP PACEART REMOTE DEVICE CHECK
Date Time Interrogation Session: 20221025091303
Implantable Lead Implant Date: 20171116
Implantable Lead Implant Date: 20171116
Implantable Lead Location: 753859
Implantable Lead Location: 753860
Implantable Lead Model: 377
Implantable Lead Model: 377
Implantable Lead Serial Number: 49591424
Implantable Lead Serial Number: 49679947
Implantable Pulse Generator Implant Date: 20171116
Pulse Gen Model: 407145
Pulse Gen Serial Number: 68853186

## 2021-10-23 NOTE — Progress Notes (Signed)
Remote pacemaker transmission.   

## 2021-10-27 DIAGNOSIS — S42294A Other nondisplaced fracture of upper end of right humerus, initial encounter for closed fracture: Secondary | ICD-10-CM | POA: Diagnosis not present

## 2021-11-21 ENCOUNTER — Other Ambulatory Visit: Payer: Self-pay

## 2021-11-21 ENCOUNTER — Other Ambulatory Visit (HOSPITAL_COMMUNITY): Payer: Self-pay | Admitting: Cardiovascular Disease

## 2021-11-21 ENCOUNTER — Ambulatory Visit (HOSPITAL_COMMUNITY)
Admission: RE | Admit: 2021-11-21 | Discharge: 2021-11-21 | Disposition: A | Discharge status: Home / Self Care | Payer: Medicare Other | Source: Ambulatory Visit | Attending: Cardiovascular Disease | Admitting: Cardiovascular Disease

## 2021-11-21 DIAGNOSIS — I6523 Occlusion and stenosis of bilateral carotid arteries: Secondary | ICD-10-CM

## 2021-11-25 ENCOUNTER — Encounter: Payer: Self-pay | Admitting: Cardiovascular Disease

## 2021-11-25 ENCOUNTER — Ambulatory Visit: Payer: Medicare Other | Admitting: Cardiovascular Disease

## 2021-11-25 ENCOUNTER — Other Ambulatory Visit: Payer: Self-pay

## 2021-11-25 DIAGNOSIS — E782 Mixed hyperlipidemia: Secondary | ICD-10-CM

## 2021-11-25 DIAGNOSIS — I2510 Atherosclerotic heart disease of native coronary artery without angina pectoris: Secondary | ICD-10-CM | POA: Diagnosis not present

## 2021-11-25 DIAGNOSIS — I6523 Occlusion and stenosis of bilateral carotid arteries: Secondary | ICD-10-CM

## 2021-11-25 DIAGNOSIS — I10 Essential (primary) hypertension: Secondary | ICD-10-CM

## 2021-11-25 DIAGNOSIS — I459 Conduction disorder, unspecified: Secondary | ICD-10-CM

## 2021-11-25 DIAGNOSIS — I350 Nonrheumatic aortic (valve) stenosis: Secondary | ICD-10-CM

## 2021-11-25 NOTE — Assessment & Plan Note (Signed)
History of moderate bilateral ICA stenosis by duplex ultrasound performed 11/21/2021.  This will be repeated on an annual basis.

## 2021-11-25 NOTE — Patient Instructions (Signed)
Medication Instructions:  Your physician recommends that you continue on your current medications as directed. Please refer to the Current Medication list given to you today.  *If you need a refill on your cardiac medications before your next appointment, please call your pharmacy*   Testing/Procedures: Your physician has requested that you have an echocardiogram. Echocardiography is a painless test that uses sound waves to create images of your heart. It provides your doctor with information about the size and shape of your heart and how well your heart's chambers and valves are working. This procedure takes approximately one hour. There are no restrictions for this procedure. This procedure is done at [address]   Follow-Up: At [facility], you and your health needs are our priority.  As part of our continuing mission to provide you with exceptional heart care, we have created designated Provider Care Teams.  These Care Teams include your primary Cardiologist (physician) and Advanced Practice Providers (APPs -  Physician Assistants and Nurse Practitioners) who all work together to provide you with the care you need, when you need it.  We recommend signing up for the patient portal called "MyChart".  Sign up information is provided on this After Visit Summary.  MyChart is used to connect with patients for Virtual Visits (Telemedicine).  Patients are able to view lab/test results, encounter notes, upcoming appointments, etc.  Non-urgent messages can be sent to your provider as well.   To learn more about what you can do with MyChart, go to [email or web address].    Your next appointment:   12 month(s)  The format for your next appointment:   In Person  Provider:   Jonathan Berry, MD 

## 2021-11-25 NOTE — Progress Notes (Signed)
11/25/2021 Thomas English   1932/06/30  [id number]  Primary Physician Street, Stephanie Coup, MD Primary Cardiologist: Runell Gess MD FACP, [place], [place], [place]  HPI:  Thomas English is a 87 y.o.  mildly overweight, married Caucasian male, father of 2, grandfather to 1 grandchild who I last saw 11/22/2020. He is accompanied by his wife today. He has a history of CAD status post coronary artery bypass grafting in  2000, with a LIMA to his LAD, a vein graft to the first obtuse marginal branch, second obtuse marginal branch and to the RCA. His other problems include hypertension, hyperlipidemia and noninsulin-requiring diabetes. He does have bilateral internal carotid artery stenosis left greater than right which has remained stable over the last 6 months. He is neurologically asymptomatic. He also has mild aortic stenosis with valve area of 1.4 cm squared. His last Myoview performed October 06, 2011, was low risk. He is asymptomatic. Dr. Sheria Lang follows his lipid profile. He has had progressive aortic stenosis with his most recent echo performed 02/28/16 were revealing preserved LV function .  He also has moderate bilateral ICA stenosis by duplex ultrasound. When I saw him back in October of last year his dyspnea had progressed as had his aortic stenosis. I performed right and left heart cath on him 10/05/16 revealing patent grafts and he ultimately underwent TAV are Edwards CPE in 3 valve implantation by Drs. 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 Ladona Ridgel this follow-up 2-D echo performed 12/10/16 revealed normal LV function with a well-functioning bioprosthesis. He feels clinically improved, denies shortness of breath and has more energy.    Since I saw him  a year ago he remains stable.  He denies chest pain or shortness of breath.  His 2D echo performed a year ago showed normal LV function  with a well-functioning aortic bioprosthesis and carotid Dopplers revealed moderate and stable bilateral ICA stenosis.       Current Meds  Medication Sig   amLODipine (NORVASC) 10 MG tablet Take 10 mg by mouth daily with breakfast.    amoxicillin (AMOXIL) 500 MG capsule Take 4 tablets I hour before dental procedure   benazepril (LOTENSIN) 20 MG tablet Take 20 mg by mouth daily with breakfast.    carvedilol (COREG) 6.25 MG tablet Take 6.25 mg by mouth 2 (two) times daily with a meal.   clopidogrel (PLAVIX) 75 MG tablet Take 75 mg by mouth daily.    levothyroxine (SYNTHROID, LEVOTHROID) 100 MCG tablet Take 100 mcg by mouth at bedtime.   metFORMIN (GLUCOPHAGE) 500 MG tablet Take 1 tablet by mouth 2 (two) times daily.   simvastatin (ZOCOR) 20 MG tablet Take 20 mg by mouth every evening.      No Known Allergies  Social History   Socioeconomic History   Marital status: Married    Spouse name: Not on file   Number of children: 2   Years of education: GED   Highest education level: Not on file  Occupational History   Not on file  Tobacco Use   Smoking status: Former    Packs/day: 1.00    Years: 5.00    Pack years: 5.00    Types: Cigarettes    Quit date: 07/20/1973    Years since quitting: 48.3   Smokeless tobacco: Never  Vaping Use   Vaping Use: Never used  Substance and Sexual Activity   Alcohol use: No  Drug use: No   Sexual activity: Not on file  Other Topics Concern   Not on file  Social History Narrative   Not on file   Social Determinants of Health   Financial Resource Strain: Not on file  Food Insecurity: Not on file  Transportation Needs: Not on file  Physical Activity: Not on file  Stress: Not on file  Social Connections: Not on file  Intimate Partner Violence: Not on file     Review of Systems: General: negative for chills, fever, night sweats or weight changes.  Cardiovascular: negative for chest pain, dyspnea on exertion, edema, orthopnea,  palpitations, paroxysmal nocturnal dyspnea or shortness of breath Dermatological: negative for rash Respiratory: negative for cough or wheezing Urologic: negative for hematuria Abdominal: negative for nausea, vomiting, diarrhea, bright red blood per rectum, melena, or hematemesis Neurologic: negative for visual changes, syncope, or dizziness All other systems reviewed and are otherwise negative except as noted above.    Blood pressure 90/64, pulse 67, height 5\' 10"  (1.778 m), weight 163 lb 3.2 oz (74 kg), SpO2 98 %.  General appearance: alert and no distress Neck: no adenopathy, no JVD, supple, symmetrical, trachea midline, thyroid not enlarged, symmetric, no tenderness/mass/nodules, and soft right carotid Darriona Dehaas Lungs: clear to auscultation bilaterally Heart: Soft outflow tract murmur consistent with aortic stenosis. Extremities: extremities normal, atraumatic, no cyanosis or edema Pulses: 2+ and symmetric Skin: Skin color, texture, turgor normal. No rashes or lesions Neurologic: Grossly normal  EKG sinus rhythm at 67 without ST or T wave changes.  I personally reviewed this EKG.  ASSESSMENT AND PLAN:   Carotid stenosis, bil, last dopplers 12/2013 50-69% History of moderate bilateral ICA stenosis by duplex ultrasound performed 11/21/2021.  This will be repeated on an annual basis.  Essential hypertension History of essential hypertension blood pressure measured today at 90/64.  He is on amlodipine, benazepril and carvedilol.  Hyperlipidemia History of hyperlipidemia on statin therapy with lipid profile performed 09/12/2021 revealing total cholesterol 129  Coronary artery disease History of CAD status post coronary bypass grafting in 2000 with a LIMA to his LAD, vein to the first obtuse marginal branch, second obtuse marginal branch and to the RCA.  Cardiac catheterization performed by me in anticipation of TAVR on 10/05/2016 revealed patent grafts.  He denies chest pain or shortness of  breath.  Severe aortic stenosis History of severe aortic stenosis status post TAVR by Drs. Bartle and Cooper 11/03/2016.  This was complicated by complete heart block requiring permanent transvenous pacemaker insertion by Dr. Crissie Sickles.  His most recent 2D echo performed 12/16/2020 revealed normal LV systolic function with a well-functioning aortic bioprosthesis.  This will be followed on an annual basis.  Heart block s/p Biotronic PPM 10/2016 Transient heart block status post permanent transvenous pacemaker by Dr. Lovena Le who follows him on an annual basis.     Lorretta Harp MD FACP,FACC,FAHA, [facility] 11/25/2021 10:49 AM

## 2021-11-25 NOTE — Assessment & Plan Note (Signed)
History of severe aortic stenosis status post TAVR by Drs. Bartle and Cooper 11/03/2016.  This was complicated by complete heart block requiring permanent transvenous pacemaker insertion by Dr. Sharrell Ku.  His most recent 2D echo performed 12/16/2020 revealed normal LV systolic function with a well-functioning aortic bioprosthesis.  This will be followed on an annual basis.

## 2021-11-25 NOTE — Assessment & Plan Note (Signed)
History of hyperlipidemia on statin therapy with lipid profile performed 09/12/2021 revealing total cholesterol 129

## 2021-11-25 NOTE — Assessment & Plan Note (Signed)
Transient heart block status post permanent transvenous pacemaker by Dr. Ladona Ridgel who follows him on an annual basis.

## 2021-11-25 NOTE — Assessment & Plan Note (Signed)
History of essential hypertension blood pressure measured today at 90/64.  He is on amlodipine, benazepril and carvedilol.

## 2021-11-25 NOTE — Assessment & Plan Note (Signed)
History of CAD status post coronary bypass grafting in 2000 with a LIMA to his LAD, vein to the first obtuse marginal branch, second obtuse marginal branch and to the RCA.  Cardiac catheterization performed by me in anticipation of TAVR on 10/05/2016 revealed patent grafts.  He denies chest pain or shortness of breath.

## 2021-11-26 DIAGNOSIS — S42294A Other nondisplaced fracture of upper end of right humerus, initial encounter for closed fracture: Secondary | ICD-10-CM | POA: Diagnosis not present

## 2021-12-01 DIAGNOSIS — M25511 Pain in right shoulder: Secondary | ICD-10-CM | POA: Diagnosis not present

## 2021-12-01 DIAGNOSIS — S42294A Other nondisplaced fracture of upper end of right humerus, initial encounter for closed fracture: Secondary | ICD-10-CM | POA: Diagnosis not present

## 2021-12-04 DIAGNOSIS — M25511 Pain in right shoulder: Secondary | ICD-10-CM | POA: Diagnosis not present

## 2021-12-04 DIAGNOSIS — S42294A Other nondisplaced fracture of upper end of right humerus, initial encounter for closed fracture: Secondary | ICD-10-CM | POA: Diagnosis not present

## 2021-12-10 DIAGNOSIS — S42294A Other nondisplaced fracture of upper end of right humerus, initial encounter for closed fracture: Secondary | ICD-10-CM | POA: Diagnosis not present

## 2021-12-10 DIAGNOSIS — M25511 Pain in right shoulder: Secondary | ICD-10-CM | POA: Diagnosis not present

## 2021-12-16 ENCOUNTER — Other Ambulatory Visit: Payer: Self-pay

## 2021-12-16 ENCOUNTER — Ambulatory Visit (HOSPITAL_COMMUNITY): Payer: Medicare Other | Attending: Cardiovascular Disease

## 2021-12-16 DIAGNOSIS — I459 Conduction disorder, unspecified: Secondary | ICD-10-CM | POA: Diagnosis not present

## 2021-12-16 DIAGNOSIS — Z952 Presence of prosthetic heart valve: Secondary | ICD-10-CM

## 2021-12-16 DIAGNOSIS — I350 Nonrheumatic aortic (valve) stenosis: Secondary | ICD-10-CM | POA: Insufficient documentation

## 2021-12-16 DIAGNOSIS — I10 Essential (primary) hypertension: Secondary | ICD-10-CM | POA: Insufficient documentation

## 2021-12-16 DIAGNOSIS — I2510 Atherosclerotic heart disease of native coronary artery without angina pectoris: Secondary | ICD-10-CM | POA: Diagnosis not present

## 2021-12-16 LAB — ECHOCARDIOGRAM COMPLETE
AV Mean grad: 6.4 mmHg
AV Peak grad: 10.6 mmHg
Ao pk vel: 1.63 m/s
Area-P 1/2: 3.91 cm2
S' Lateral: 3.1 cm

## 2022-01-01 DIAGNOSIS — S42294A Other nondisplaced fracture of upper end of right humerus, initial encounter for closed fracture: Secondary | ICD-10-CM | POA: Diagnosis not present

## 2022-01-13 ENCOUNTER — Ambulatory Visit (INDEPENDENT_AMBULATORY_CARE_PROVIDER_SITE_OTHER): Payer: Medicare Other

## 2022-01-13 DIAGNOSIS — I442 Atrioventricular block, complete: Secondary | ICD-10-CM

## 2022-01-13 LAB — CUP PACEART REMOTE DEVICE CHECK
Date Time Interrogation Session: 20230124080300
Implantable Lead Implant Date: 20171116
Implantable Lead Implant Date: 20171116
Implantable Lead Location: 753859
Implantable Lead Location: 753860
Implantable Lead Model: 377
Implantable Lead Model: 377
Implantable Lead Serial Number: 49591424
Implantable Lead Serial Number: 49679947
Implantable Pulse Generator Implant Date: 20171116
Pulse Gen Model: 407145
Pulse Gen Serial Number: 68853186

## 2022-01-23 NOTE — Progress Notes (Signed)
Remote pacemaker transmission.   

## 2022-02-20 ENCOUNTER — Ambulatory Visit: Payer: Medicare Other | Admitting: Internal Medicine

## 2022-02-20 ENCOUNTER — Other Ambulatory Visit: Payer: Self-pay

## 2022-02-20 ENCOUNTER — Encounter: Payer: Self-pay | Admitting: Internal Medicine

## 2022-02-20 VITALS — BP 110/66 | HR 88 | Ht 70.0 in | Wt 159.0 lb

## 2022-02-20 DIAGNOSIS — Z950 Presence of cardiac pacemaker: Secondary | ICD-10-CM

## 2022-02-20 DIAGNOSIS — Z952 Presence of prosthetic heart valve: Secondary | ICD-10-CM | POA: Diagnosis not present

## 2022-02-20 DIAGNOSIS — I442 Atrioventricular block, complete: Secondary | ICD-10-CM | POA: Diagnosis not present

## 2022-02-20 DIAGNOSIS — I10 Essential (primary) hypertension: Secondary | ICD-10-CM | POA: Diagnosis not present

## 2022-02-20 NOTE — Patient Instructions (Signed)
Medication Instructions:  °Your physician recommends that you continue on your current medications as directed. Please refer to the Current Medication list given to you today. ° °Labwork: °None ordered. ° °Testing/Procedures: °None ordered. ° °Follow-Up: °Your physician wants you to follow-up in: one year with Gregg Taylor, MD or one of the following Advanced Practice Providers on your designated Care Team:   °Renee Ursuy, PA-C °Michael "Andy" Tillery, PA-C ° °Remote monitoring is used to monitor your Pacemaker from home. This monitoring reduces the number of office visits required to check your device to one time per year. It allows us to keep an eye on the functioning of your device to ensure it is working properly. You are scheduled for a device check from home on 04/14/2022. You may send your transmission at any time that day. If you have a wireless device, the transmission will be sent automatically. After your physician reviews your transmission, you will receive a postcard with your next transmission date. ° °Any Other Special Instructions Will Be Listed Below (If Applicable). ° °If you need a refill on your cardiac medications before your next appointment, please call your pharmacy.  ° ° ° ° °

## 2022-02-20 NOTE — Progress Notes (Signed)
? ? ? ? ?HPI ?Thomas English returns today for ongoing evaluation and management of his PPM. He developed CHB after TAVR. He has done well, s/p PPM insertion over 3 years ago. He denies chest pain or sob. His wife notes that he is sedentary since Covid. He has not had edema. No syncope. ?No Known Allergies ? ? ?Current Outpatient Medications  ?Medication Sig Dispense Refill  ? amLODipine (NORVASC) 10 MG tablet Take 10 mg by mouth daily with breakfast.   6  ? amoxicillin (AMOXIL) 500 MG capsule Take 4 tablets I hour before dental procedure 4 capsule 0  ? benazepril (LOTENSIN) 20 MG tablet Take 20 mg by mouth daily with breakfast.   6  ? carvedilol (COREG) 6.25 MG tablet Take 6.25 mg by mouth 2 (two) times daily with a meal.    ? clopidogrel (PLAVIX) 75 MG tablet Take 75 mg by mouth daily.     ? levothyroxine (SYNTHROID, LEVOTHROID) 100 MCG tablet Take 100 mcg by mouth at bedtime.  1  ? metFORMIN (GLUCOPHAGE) 500 MG tablet Take 1 tablet by mouth 2 (two) times daily.    ? simvastatin (ZOCOR) 20 MG tablet Take 20 mg by mouth every evening.     ? ?No current facility-administered medications for this visit.  ? ? ? ?Past Medical History:  ?Diagnosis Date  ? Aortic stenosis   ? a. s/p TAVR on 11/03/16  ? Arthritis   ? CAD (coronary artery disease)   ? a. CAD s/p CABG 2000   b. cath on 10/05/16 with severe native vessel CAD and patent bypass grafts  ? Carotid stenosis   ? Diabetes mellitus type 2, noninsulin dependent (HCC)   ? Heart block AV second degree   ? a. 10/2016: after TAVR. s/p Biotrionic PPM: (serial number [id number])   ? Hyperlipidemia   ? Hypertension   ? Hypothyroidism   ? ? ?ROS: ? ? All systems reviewed and negative except as noted in the HPI. ? ? ?Past Surgical History:  ?Procedure Laterality Date  ? BACK SURGERY    ? CARDIAC CATHETERIZATION  11/10/1999  ? 3-vessel disease, recommendation for CABG  ? CARDIAC CATHETERIZATION N/A 10/05/2016  ? Procedure: Right/Left Heart Cath and Coronary/Graft Angiography;   Surgeon: Runell Gess, MD;  Location: [facility] INVASIVE CV LAB;  Service: Cardiovascular;  Laterality: N/A;  ? CORONARY ARTERY BYPASS GRAFT  11/13/1999  ? LIMA to LAD and a vein graft to the first OM branch, second OM branch, and to the RCA (Dr. Wayland Salinas)  ? EP IMPLANTABLE DEVICE N/A 11/05/2016  ? Procedure: Pacemaker Implant;  Surgeon: Marinus Maw, MD;  Location: [facility] INVASIVE CV LAB;  Service: Cardiovascular;  Laterality: N/A;  ? EYE SURGERY    ? eye duct surgery  ? NM MYOCAR PERF WALL MOTION  09/2013  ? normal lexiscan myoview  ? PERIPHERAL VASCULAR CATHETERIZATION N/A 10/05/2016  ? Procedure: Abdominal Aortogram;  Surgeon: Runell Gess, MD;  Location: [facility] INVASIVE CV LAB;  Service: Cardiovascular;  Laterality: N/A;  ? TEE WITHOUT CARDIOVERSION N/A 11/03/2016  ? Procedure: TRANSESOPHAGEAL ECHOCARDIOGRAM (TEE);  Surgeon: Tonny Bollman, MD;  Location: [facility] OR;  Service: Open Heart Surgery;  Laterality: N/A;  ? TRANSCATHETER AORTIC VALVE REPLACEMENT, TRANSFEMORAL N/A 11/03/2016  ? Procedure: TRANSCATHETER AORTIC VALVE REPLACEMENT, TRANSFEMORAL;  Surgeon: Tonny Bollman, MD;  Location: [facility] OR;  Service: Open Heart Surgery;  Laterality: N/A;  ? TRANSTHORACIC ECHOCARDIOGRAM  12/2013  ? EF 55-60%, grade 1 diastolic dysfunction, high ventricular filling  pressure; severe AV stenosis; calcified MV annulus; LA & RA mildly dilated; atrial septal aneurysm  ? ? ? ?Family History  ?Problem Relation Age of Onset  ? Lung disease Mother   ? Diabetes Mother   ? Colon cancer Father   ? Diabetes Sister   ? CAD Sister   ? ? ? ?Social History  ? ?Socioeconomic History  ? Marital status: Married  ?  Spouse name: Not on file  ? Number of children: 2  ? Years of education: GED  ? Highest education level: Not on file  ?Occupational History  ? Not on file  ?Tobacco Use  ? Smoking status: Former  ?  Packs/day: 1.00  ?  Years: 5.00  ?  Pack years: 5.00  ?  Types: Cigarettes  ?  Quit date: 07/20/1973  ?  Years since quitting: 48.6  ? Smokeless  tobacco: Never  ?Vaping Use  ? Vaping Use: Never used  ?Substance and Sexual Activity  ? Alcohol use: No  ? Drug use: No  ? Sexual activity: Not on file  ?Other Topics Concern  ? Not on file  ?Social History Narrative  ? Not on file  ? ?Social Determinants of Health  ? ?Financial Resource Strain: Not on file  ?Food Insecurity: Not on file  ?Transportation Needs: Not on file  ?Physical Activity: Not on file  ?Stress: Not on file  ?Social Connections: Not on file  ?Intimate Partner Violence: Not on file  ? ? ? ?BP 110/66   Pulse 88   Ht 5\' 10"  (1.778 m)   Wt 159 lb (72.1 kg)   SpO2 96%   BMI 22.81 kg/m?  ? ?Physical Exam: ? ?Well appearing NAD ?HEENT: Unremarkable ?Neck:  No JVD, no thyromegally ?Lymphatics:  No adenopathy ?Back:  No CVA tenderness ?Lungs:  Clear with no wheezes ?HEART:  Regular rate rhythm, no murmurs, no rubs, no clicks ?Abd:  soft, positive bowel sounds, no organomegally, no rebound, no guarding ?Ext:  2 plus pulses, no edema, no cyanosis, no clubbing ?Skin:  No rashes no nodules ?Neuro:  CN II through XII intact, motor grossly intact ? ? ?DEVICE  ?Normal device function.  See PaceArt for details.  ? ?Assess/Plan: 1. CHB - he is only pacing 1/3 of the time. His conduction has improved since his TAVR. ?2. AS - he is s/p TAVR and his exam is reassuring today. ?3. HTN - his bp is improved today. ?4. PPM - his biotronik DDD PM is working normally.  ? ? ?  ?Dorathy Daft, MD ?

## 2022-03-11 DIAGNOSIS — D638 Anemia in other chronic diseases classified elsewhere: Secondary | ICD-10-CM | POA: Diagnosis not present

## 2022-03-11 DIAGNOSIS — E785 Hyperlipidemia, unspecified: Secondary | ICD-10-CM | POA: Diagnosis not present

## 2022-03-11 DIAGNOSIS — E1122 Type 2 diabetes mellitus with diabetic chronic kidney disease: Secondary | ICD-10-CM | POA: Diagnosis not present

## 2022-03-11 DIAGNOSIS — N183 Chronic kidney disease, stage 3 unspecified: Secondary | ICD-10-CM | POA: Diagnosis not present

## 2022-03-18 DIAGNOSIS — E1122 Type 2 diabetes mellitus with diabetic chronic kidney disease: Secondary | ICD-10-CM | POA: Diagnosis not present

## 2022-03-18 DIAGNOSIS — N183 Chronic kidney disease, stage 3 unspecified: Secondary | ICD-10-CM | POA: Diagnosis not present

## 2022-03-18 DIAGNOSIS — E785 Hyperlipidemia, unspecified: Secondary | ICD-10-CM | POA: Diagnosis not present

## 2022-03-18 DIAGNOSIS — I10 Essential (primary) hypertension: Secondary | ICD-10-CM | POA: Diagnosis not present

## 2022-04-14 ENCOUNTER — Ambulatory Visit (INDEPENDENT_AMBULATORY_CARE_PROVIDER_SITE_OTHER): Payer: Medicare Other

## 2022-04-14 DIAGNOSIS — I442 Atrioventricular block, complete: Secondary | ICD-10-CM

## 2022-04-14 LAB — CUP PACEART REMOTE DEVICE CHECK
Date Time Interrogation Session: 20230425082555
Implantable Lead Implant Date: 20171116
Implantable Lead Implant Date: 20171116
Implantable Lead Location: 753859
Implantable Lead Location: 753860
Implantable Lead Model: 377
Implantable Lead Model: 377
Implantable Lead Serial Number: 49591424
Implantable Lead Serial Number: 49679947
Implantable Pulse Generator Implant Date: 20171116
Pulse Gen Model: 407145
Pulse Gen Serial Number: 68853186

## 2022-04-15 DIAGNOSIS — H524 Presbyopia: Secondary | ICD-10-CM | POA: Diagnosis not present

## 2022-04-30 NOTE — Progress Notes (Signed)
Remote pacemaker transmission.   

## 2022-07-14 ENCOUNTER — Ambulatory Visit (INDEPENDENT_AMBULATORY_CARE_PROVIDER_SITE_OTHER): Payer: Medicare Other

## 2022-07-14 DIAGNOSIS — I442 Atrioventricular block, complete: Secondary | ICD-10-CM | POA: Diagnosis not present

## 2022-07-14 LAB — CUP PACEART REMOTE DEVICE CHECK
Date Time Interrogation Session: 20230725070215
Implantable Lead Implant Date: 20171116
Implantable Lead Implant Date: 20171116
Implantable Lead Location: 753859
Implantable Lead Location: 753860
Implantable Lead Model: 377
Implantable Lead Model: 377
Implantable Lead Serial Number: 49591424
Implantable Lead Serial Number: 49679947
Implantable Pulse Generator Implant Date: 20171116
Pulse Gen Model: 407145
Pulse Gen Serial Number: 68853186

## 2022-08-11 NOTE — Progress Notes (Signed)
Remote pacemaker transmission.   

## 2022-09-21 DIAGNOSIS — N183 Chronic kidney disease, stage 3 unspecified: Secondary | ICD-10-CM | POA: Diagnosis not present

## 2022-09-21 DIAGNOSIS — I25119 Atherosclerotic heart disease of native coronary artery with unspecified angina pectoris: Secondary | ICD-10-CM | POA: Diagnosis not present

## 2022-09-21 DIAGNOSIS — E1122 Type 2 diabetes mellitus with diabetic chronic kidney disease: Secondary | ICD-10-CM | POA: Diagnosis not present

## 2022-09-21 DIAGNOSIS — E785 Hyperlipidemia, unspecified: Secondary | ICD-10-CM | POA: Diagnosis not present

## 2022-10-13 ENCOUNTER — Ambulatory Visit (INDEPENDENT_AMBULATORY_CARE_PROVIDER_SITE_OTHER): Payer: Medicare Other

## 2022-10-13 DIAGNOSIS — I442 Atrioventricular block, complete: Secondary | ICD-10-CM

## 2022-10-15 LAB — CUP PACEART REMOTE DEVICE CHECK
Date Time Interrogation Session: 20231025094131
Implantable Lead Connection Status: 753985
Implantable Lead Connection Status: 753985
Implantable Lead Implant Date: 20171116
Implantable Lead Implant Date: 20171116
Implantable Lead Location: 753859
Implantable Lead Location: 753860
Implantable Lead Model: 377
Implantable Lead Model: 377
Implantable Lead Serial Number: 49591424
Implantable Lead Serial Number: 49679947
Implantable Pulse Generator Implant Date: 20171116
Pulse Gen Model: 407145
Pulse Gen Serial Number: 68853186

## 2022-11-02 NOTE — Progress Notes (Signed)
Remote pacemaker transmission.   

## 2022-11-09 ENCOUNTER — Other Ambulatory Visit (HOSPITAL_COMMUNITY): Payer: Self-pay | Admitting: Cardiovascular Disease

## 2022-11-09 DIAGNOSIS — I6523 Occlusion and stenosis of bilateral carotid arteries: Secondary | ICD-10-CM

## 2022-11-26 ENCOUNTER — Ambulatory Visit (HOSPITAL_COMMUNITY)
Admission: RE | Admit: 2022-11-26 | Discharge: 2022-11-26 | Disposition: A | Discharge status: Home / Self Care | Payer: Medicare Other | Source: Ambulatory Visit | Attending: Cardiovascular Disease | Admitting: Cardiovascular Disease

## 2022-11-26 DIAGNOSIS — I6523 Occlusion and stenosis of bilateral carotid arteries: Secondary | ICD-10-CM | POA: Diagnosis not present

## 2022-12-09 ENCOUNTER — Ambulatory Visit: Payer: Medicare Other | Attending: Cardiovascular Disease | Admitting: Cardiovascular Disease

## 2022-12-09 ENCOUNTER — Telehealth: Payer: Self-pay | Admitting: Internal Medicine

## 2022-12-09 ENCOUNTER — Encounter: Payer: Self-pay | Admitting: Cardiovascular Disease

## 2022-12-09 ENCOUNTER — Telehealth: Payer: Self-pay | Admitting: Cardiovascular Disease

## 2022-12-09 VITALS — BP 140/60 | HR 60 | Ht 70.0 in | Wt 160.0 lb

## 2022-12-09 DIAGNOSIS — I10 Essential (primary) hypertension: Secondary | ICD-10-CM | POA: Diagnosis not present

## 2022-12-09 DIAGNOSIS — E782 Mixed hyperlipidemia: Secondary | ICD-10-CM

## 2022-12-09 DIAGNOSIS — I442 Atrioventricular block, complete: Secondary | ICD-10-CM

## 2022-12-09 DIAGNOSIS — Z952 Presence of prosthetic heart valve: Secondary | ICD-10-CM

## 2022-12-09 DIAGNOSIS — I2510 Atherosclerotic heart disease of native coronary artery without angina pectoris: Secondary | ICD-10-CM | POA: Diagnosis not present

## 2022-12-09 DIAGNOSIS — I6523 Occlusion and stenosis of bilateral carotid arteries: Secondary | ICD-10-CM

## 2022-12-09 NOTE — Assessment & Plan Note (Signed)
History of hyperlipidemia on statin therapy with lipid profile performed 09/21/2022 revealing total cholesterol 132

## 2022-12-09 NOTE — Assessment & Plan Note (Signed)
History of complete heart block which occurred at the time of TAVR status post permanent transvenous pacemaker implantation by Dr. Ladona Ridgel who follows his pacemaker.

## 2022-12-09 NOTE — Patient Instructions (Signed)
Medication Instructions:  Your physician recommends that you continue on your current medications as directed. Please refer to the Current Medication list given to you today.  *If you need a refill on your cardiac medications before your next appointment, please call your pharmacy*    Testing/Procedures: Your physician has requested that you have an echocardiogram. Echocardiography is a painless test that uses sound waves to create images of your heart. It provides your doctor with information about the size and shape of your heart and how well your heart's chambers and valves are working. This procedure takes approximately one hour. There are no restrictions for this procedure. Please do NOT wear cologne, perfume, aftershave, or lotions (deodorant is allowed). Please arrive 15 minutes prior to your appointment time. This procedure will be done at [address]   Your physician has requested that you have a carotid duplex. This test is an ultrasound of the carotid arteries in your neck. It looks at blood flow through these arteries that supply the brain with blood. Allow one hour for this exam. There are no restrictions or special instructions. This will take place at [address], [address]. To do in December 2024.   Follow-Up: At [facility], you and your health needs are our priority.  As part of our continuing mission to provide you with exceptional heart care, we have created designated Provider Care Teams.  These Care Teams include your primary Cardiologist (physician) and Advanced Practice Providers (APPs -  Physician Assistants and Nurse Practitioners) who all work together to provide you with the care you need, when you need it.  We recommend signing up for the patient portal called "MyChart".  Sign up information is provided on this After Visit Summary.  MyChart is used to connect with patients for Virtual Visits (Telemedicine).  Patients are able to view  lab/test results, encounter notes, upcoming appointments, etc.  Non-urgent messages can be sent to your provider as well.   To learn more about what you can do with MyChart, go to [email or web address].    Your next appointment:   12 month(s)  The format for your next appointment:   In Person  Provider:   Nanetta Batty, MD

## 2022-12-09 NOTE — Assessment & Plan Note (Signed)
History of essential hypertension a blood pressure measured today at 140/60.  He is on amlodipine, benazepril and carvedilol.

## 2022-12-09 NOTE — Assessment & Plan Note (Signed)
History of CAD status post coronary artery bypass grafting in 2000 with a LIMA to his LAD, vein graft to the first obtuse marginal branch, second obtuse marginal branch and to the RCA.  I performed right and left heart cath on him 10/05/2016 revealing patent grafts.  He did have severe aortic stenosis.

## 2022-12-09 NOTE — Telephone Encounter (Signed)
Spoke with pt's wife Erskine Squibb (ok per [facility]) regarding when pt's carotid ultrasound should be done. Order was placed per Dr. Allyson Sabal today to redo carotid ultrasound in December of 2024. Pt was mistakenly scheduled for carotid ultrasound in December of this year. Per this RN noticed this mistake and reached out to scheduler who called wife to re-make appointment. Appointment was made for January of 2024. Wife requested a call back with nurse to discuss need for this procedure and time frame. Pt's carotid appointment changed to December of 2024. Wife verbalizes understanding.

## 2022-12-09 NOTE — Telephone Encounter (Signed)
Patient's wife is calling about his carotid test she has schedule for her husband on 12/31/22, she is saying that it doesn't need to be schedule until December 2024 not for January. Please advise.

## 2022-12-09 NOTE — Progress Notes (Signed)
12/09/2022 Thomas English   1932-04-16  [id number]  Primary Physician Street, Stephanie Coup, MD Primary Cardiologist: Runell Gess MD FACP, [place], [place], [place]  HPI:  Thomas English is a 88 y.o.   mildly overweight, married Caucasian male, father of 2, grandfather to 1 grandchild who I last saw 11/25/2021. He is accompanied by his wife today. He has a history of CAD status post coronary artery bypass grafting in  2000, with a LIMA to his LAD, a vein graft to the first obtuse marginal branch, second obtuse marginal branch and to the RCA. His other problems include hypertension, hyperlipidemia and noninsulin-requiring diabetes. He does have bilateral internal carotid artery stenosis left greater than right which has remained stable over the last 6 months. He is neurologically asymptomatic. He also has mild aortic stenosis with valve area of 1.4 cm squared. His last Myoview performed October 06, 2011, was low risk. He is asymptomatic. Dr. Sheria Lang follows his lipid profile. He has had progressive aortic stenosis with his most recent echo performed 02/28/16 were revealing preserved LV function .  He also has moderate bilateral ICA stenosis by duplex ultrasound. When I saw him back in October of last year his dyspnea had progressed as had his aortic stenosis. I performed right and left heart cath on him 10/05/16 revealing patent grafts and he ultimately underwent TAV are Edwards CPE in 3 valve implantation by Drs. 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 Ladona Ridgel this follow-up 2-D echo performed 12/10/16 revealed normal LV function with a well-functioning bioprosthesis. He feels clinically improved, denies shortness of breath and has more energy.    Since I saw him  a year ago he remains stable.  He denies chest pain or shortness of breath.  His 2D echo performed a year ago showed normal LV function  with a well-functioning aortic bioprosthesis and carotid Dopplers revealed moderate and stable bilateral ICA stenosis.     Current Meds  Medication Sig   amLODipine (NORVASC) 10 MG tablet Take 10 mg by mouth daily with breakfast.    amoxicillin (AMOXIL) 500 MG capsule Take 4 tablets I hour before dental procedure   benazepril (LOTENSIN) 20 MG tablet Take 20 mg by mouth daily with breakfast.    carvedilol (COREG) 6.25 MG tablet Take 6.25 mg by mouth 2 (two) times daily with a meal.   clopidogrel (PLAVIX) 75 MG tablet Take 75 mg by mouth daily.    levothyroxine (SYNTHROID, LEVOTHROID) 100 MCG tablet Take 100 mcg by mouth at bedtime.   metFORMIN (GLUCOPHAGE) 500 MG tablet Take 1 tablet by mouth 2 (two) times daily.   simvastatin (ZOCOR) 20 MG tablet Take 20 mg by mouth every evening.      No Known Allergies  Social History   Socioeconomic History   Marital status: Married    Spouse name: Not on file   Number of children: 2   Years of education: GED   Highest education level: Not on file  Occupational History   Not on file  Tobacco Use   Smoking status: Former    Packs/day: 1.00    Years: 5.00    Total pack years: 5.00    Types: Cigarettes    Quit date: 07/20/1973    Years since quitting: 49.4   Smokeless tobacco: Never  Vaping Use   Vaping Use: Never used  Substance and Sexual Activity   Alcohol use: No  Drug use: No   Sexual activity: Not on file  Other Topics Concern   Not on file  Social History Narrative   Not on file   Social Determinants of Health   Financial Resource Strain: Not on file  Food Insecurity: Not on file  Transportation Needs: Not on file  Physical Activity: Not on file  Stress: Not on file  Social Connections: Not on file  Intimate Partner Violence: Not on file     Review of Systems: General: negative for chills, fever, night sweats or weight changes.  Cardiovascular: negative for chest pain, dyspnea on exertion, edema, orthopnea,  palpitations, paroxysmal nocturnal dyspnea or shortness of breath Dermatological: negative for rash Respiratory: negative for cough or wheezing Urologic: negative for hematuria Abdominal: negative for nausea, vomiting, diarrhea, bright red blood per rectum, melena, or hematemesis Neurologic: negative for visual changes, syncope, or dizziness All other systems reviewed and are otherwise negative except as noted above.    Blood pressure (!) 140/60, pulse 60, height 5\' 10"  (1.778 m), weight 160 lb (72.6 kg).  General appearance: alert and no distress Neck: no adenopathy, no JVD, supple, symmetrical, trachea midline, thyroid not enlarged, symmetric, no tenderness/mass/nodules, and bilateral carotid bruits Lungs: clear to auscultation bilaterally Heart: Soft outflow tract murmur consistent with aortic stenosis Extremities: extremities normal, atraumatic, no cyanosis or edema Pulses: 2+ and symmetric Skin: Skin color, texture, turgor normal. No rashes or lesions Neurologic: Grossly normal  EKG sinus rhythm at 60 without ST or T wave changes.  Personally reviewed this EKG.  ASSESSMENT AND PLAN:   Carotid stenosis, bil, last dopplers 12/2013 50-69% Bilateral carotid stenosis by recent duplex ultrasound performed 11/26/2022.  These were in the moderate range.  Will repeat this in 1 year.  Essential hypertension History of essential hypertension a blood pressure measured today at 140/60.  He is on amlodipine, benazepril and carvedilol.  Hyperlipidemia History of hyperlipidemia on statin therapy with lipid profile performed 09/21/2022 revealing total cholesterol 132  Coronary artery disease History of CAD status post coronary artery bypass grafting in 2000 with a LIMA to his LAD, vein graft to the first obtuse marginal branch, second obtuse marginal branch and to the RCA.  I performed right and left heart cath on him 10/05/2016 revealing patent grafts.  He did have severe aortic stenosis.  S/P  TAVR (transcatheter aortic valve replacement) History of TAVR by Drs. Bartle and 10/07/2016 11/03/2016 with an Edwards bioprosthetic valve.  His most recent 2D echo performed 1 year ago revealed normal LV systolic function with a well-functioning aortic bioprosthesis.  This will be repeated.  Complete heart block (HCC) History of complete heart block which occurred at the time of TAVR status post permanent transvenous pacemaker implantation by Dr. 11/05/2016 who follows his pacemaker.     Ladona Ridgel MD FACP,FACC,FAHA, [facility] 12/09/2022 11:00 AM

## 2022-12-09 NOTE — Assessment & Plan Note (Signed)
Bilateral carotid stenosis by recent duplex ultrasound performed 11/26/2022.  These were in the moderate range.  Will repeat this in 1 year.

## 2022-12-09 NOTE — Telephone Encounter (Signed)
Patient's wife called stating they received a new monitor today, she wants to know what she is to do with the old monitor.  She is not sure why she received a new monitor for him.  Please advise.

## 2022-12-09 NOTE — Assessment & Plan Note (Signed)
History of TAVR by Drs. Bartle and Excell Seltzer 11/03/2016 with an Edwards bioprosthetic valve.  His most recent 2D echo performed 1 year ago revealed normal LV systolic function with a well-functioning aortic bioprosthesis.  This will be repeated.

## 2022-12-10 ENCOUNTER — Telehealth: Payer: Self-pay | Admitting: Internal Medicine

## 2022-12-10 NOTE — Telephone Encounter (Signed)
Patient's wife is calling stating she unplugged the old device, but did not turn it off. She states she plugged in the new one and it is charging.

## 2022-12-10 NOTE — Telephone Encounter (Signed)
Patient's wife has a questions about the device relating to patient' pacer. Please give her a call.

## 2022-12-10 NOTE — Telephone Encounter (Signed)
Wife called back to follow-up on the patient receiving new monitor and next steps.

## 2022-12-10 NOTE — Telephone Encounter (Signed)
Spoke with wife,  They have a new remote device because the newer runs off of updated speed of 4G. The old one is 3G and obsolete.   They need to dispose of the other one.  Also, patient is due for 1 year in February.   [place], schedules is reaching out to set up.

## 2022-12-11 NOTE — Telephone Encounter (Signed)
Returned call to Pt's wife.  Advised new monitor was working and she can throw old one away.

## 2022-12-16 ENCOUNTER — Encounter (HOSPITAL_COMMUNITY): Payer: Medicare Other

## 2022-12-31 ENCOUNTER — Encounter (HOSPITAL_COMMUNITY): Payer: Medicare Other

## 2023-01-12 ENCOUNTER — Ambulatory Visit: Payer: Medicare Other | Attending: Internal Medicine

## 2023-01-12 DIAGNOSIS — I442 Atrioventricular block, complete: Secondary | ICD-10-CM

## 2023-01-12 LAB — CUP PACEART REMOTE DEVICE CHECK
Date Time Interrogation Session: 20240123105918
Implantable Lead Connection Status: 753985
Implantable Lead Connection Status: 753985
Implantable Lead Implant Date: 20171116
Implantable Lead Implant Date: 20171116
Implantable Lead Location: 753859
Implantable Lead Location: 753860
Implantable Lead Model: 377
Implantable Lead Model: 377
Implantable Lead Serial Number: 49591424
Implantable Lead Serial Number: 49679947
Implantable Pulse Generator Implant Date: 20171116
Pulse Gen Model: 407145
Pulse Gen Serial Number: 68853186

## 2023-01-13 ENCOUNTER — Ambulatory Visit (HOSPITAL_COMMUNITY): Payer: Medicare Other | Attending: Cardiovascular Disease

## 2023-01-13 DIAGNOSIS — I10 Essential (primary) hypertension: Secondary | ICD-10-CM | POA: Diagnosis not present

## 2023-01-13 DIAGNOSIS — I6523 Occlusion and stenosis of bilateral carotid arteries: Secondary | ICD-10-CM | POA: Insufficient documentation

## 2023-01-13 DIAGNOSIS — I442 Atrioventricular block, complete: Secondary | ICD-10-CM | POA: Diagnosis not present

## 2023-01-13 DIAGNOSIS — I2510 Atherosclerotic heart disease of native coronary artery without angina pectoris: Secondary | ICD-10-CM | POA: Insufficient documentation

## 2023-01-13 DIAGNOSIS — Z952 Presence of prosthetic heart valve: Secondary | ICD-10-CM | POA: Insufficient documentation

## 2023-01-13 DIAGNOSIS — E782 Mixed hyperlipidemia: Secondary | ICD-10-CM | POA: Insufficient documentation

## 2023-01-13 LAB — ECHOCARDIOGRAM COMPLETE
AR max vel: 2.19 cm2
AV Area VTI: 2.01 cm2
AV Area mean vel: 2.11 cm2
AV Mean grad: 5.5 mmHg
AV Peak grad: 10.5 mmHg
Ao pk vel: 1.62 m/s
Area-P 1/2: 3.48 cm2
S' Lateral: 2.7 cm

## 2023-01-18 ENCOUNTER — Other Ambulatory Visit: Payer: Self-pay

## 2023-01-18 DIAGNOSIS — I10 Essential (primary) hypertension: Secondary | ICD-10-CM

## 2023-01-18 DIAGNOSIS — Z952 Presence of prosthetic heart valve: Secondary | ICD-10-CM

## 2023-02-09 NOTE — Progress Notes (Signed)
Remote pacemaker transmission.   

## 2023-02-18 ENCOUNTER — Encounter: Payer: Self-pay | Admitting: Internal Medicine

## 2023-02-18 ENCOUNTER — Ambulatory Visit: Payer: Medicare Other | Attending: Internal Medicine | Admitting: Internal Medicine

## 2023-02-18 VITALS — BP 140/76 | HR 72 | Ht 70.0 in | Wt 156.4 lb

## 2023-02-18 DIAGNOSIS — Z950 Presence of cardiac pacemaker: Secondary | ICD-10-CM | POA: Diagnosis not present

## 2023-02-18 DIAGNOSIS — I10 Essential (primary) hypertension: Secondary | ICD-10-CM

## 2023-02-18 DIAGNOSIS — I2510 Atherosclerotic heart disease of native coronary artery without angina pectoris: Secondary | ICD-10-CM | POA: Diagnosis not present

## 2023-02-18 NOTE — Patient Instructions (Signed)
Medication Instructions:  Your physician recommends that you continue on your current medications as directed. Please refer to the Current Medication list given to you today.  *If you need a refill on your cardiac medications before your next appointment, please call your pharmacy*  Lab Work: None ordered.  If you have labs (blood work) drawn today and your tests are completely normal, you will receive your results only by: [place] (if you have MyChart) OR A paper copy in the mail If you have any lab test that is abnormal or we need to change your treatment, we will call you to review the results.  Testing/Procedures: None ordered.  Follow-Up: At [facility], you and your health needs are our priority.  As part of our continuing mission to provide you with exceptional heart care, we have created designated Provider Care Teams.  These Care Teams include your primary Cardiologist (physician) and Advanced Practice Providers (APPs -  Physician Assistants and Nurse Practitioners) who all work together to provide you with the care you need, when you need it.  We recommend signing up for the patient portal called "MyChart".  Sign up information is provided on this After Visit Summary.  MyChart is used to connect with patients for Virtual Visits (Telemedicine).  Patients are able to view lab/test results, encounter notes, upcoming appointments, etc.  Non-urgent messages can be sent to your provider as well.   To learn more about what you can do with MyChart, go to [email or web address].    Your next appointment:   1 year(s)  The format for your next appointment:   In Person  Provider:   Cristopher Peru, MD{or one of the following Advanced Practice Providers on your designated Care Team:   Tommye Standard, [place] Legrand Como "Jonni Sanger" Chalmers Cater, [place]  Remote monitoring is used to monitor your Pacemaker from home. This monitoring reduces the number of office visits required to check your device to  one time per year. It allows [place] to keep an eye on the functioning of your device to ensure it is working properly. You are scheduled for a device check from home on 04/13/23. You may send your transmission at any time that day. If you have a wireless device, the transmission will be sent automatically. After your physician reviews your transmission, you will receive a postcard with your next transmission date.

## 2023-02-18 NOTE — Progress Notes (Signed)
HPI [name] returns today for ongoing evaluation and management of his PPM. He developed CHB after TAVR. He has done well, s/p PPM insertion over 3 years ago. He denies chest pain or sob. His wife notes that he is sedentary since Covid. He has not had edema. No syncope.  No Known Allergies   Current Outpatient Medications  Medication Sig Dispense Refill   amLODipine (NORVASC) 10 MG tablet Take 10 mg by mouth daily with breakfast.   6   amoxicillin (AMOXIL) 500 MG capsule Take 4 tablets I hour before dental procedure 4 capsule 0   benazepril (LOTENSIN) 20 MG tablet Take 20 mg by mouth daily with breakfast.   6   carvedilol (COREG) 6.25 MG tablet Take 6.25 mg by mouth 2 (two) times daily with a meal.     clopidogrel (PLAVIX) 75 MG tablet Take 75 mg by mouth daily.      levothyroxine (SYNTHROID, LEVOTHROID) 100 MCG tablet Take 100 mcg by mouth at bedtime.  1   metFORMIN (GLUCOPHAGE) 500 MG tablet Take 1 tablet by mouth 2 (two) times daily.     simvastatin (ZOCOR) 20 MG tablet Take 20 mg by mouth every evening.      No current facility-administered medications for this visit.     Past Medical History:  Diagnosis Date   Aortic stenosis    a. s/p TAVR on 11/03/16   Arthritis    CAD (coronary artery disease)    a. CAD s/p CABG 2000   b. cath on 10/05/16 with severe native vessel CAD and patent bypass grafts   Carotid stenosis    Diabetes mellitus type 2, noninsulin dependent ([place])    Heart block AV second degree    a. 10/2016: after TAVR. s/p Biotrionic PPM: (serial number [id number])    Hyperlipidemia    Hypertension    Hypothyroidism     ROS:   All systems reviewed and negative except as noted in the HPI.   Past Surgical History:  Procedure Laterality Date   BACK SURGERY     CARDIAC CATHETERIZATION  11/10/1999   3-vessel disease, recommendation for CABG   CARDIAC CATHETERIZATION N/A 10/05/2016   Procedure: Right/Left Heart Cath and Coronary/Graft Angiography;   Surgeon: Lorretta Harp, MD;  Location: [place] [facility];  Service: Cardiovascular;  Laterality: N/A;   CORONARY ARTERY BYPASS GRAFT  11/13/1999   LIMA to LAD and a vein graft to the first OM branch, second OM branch, and to the RCA (Dr. Ellison Hughs)   EP IMPLANTABLE DEVICE N/A 11/05/2016   Procedure: Pacemaker Implant;  Surgeon: Evans Lance, MD;  Location: [place] [facility];  Service: Cardiovascular;  Laterality: N/A;   EYE SURGERY     eye duct surgery   NM MYOCAR PERF WALL MOTION  09/2013   normal lexiscan myoview   PERIPHERAL VASCULAR CATHETERIZATION N/A 10/05/2016   Procedure: Abdominal Aortogram;  Surgeon: Lorretta Harp, MD;  Location: [place] [facility];  Service: Cardiovascular;  Laterality: N/A;   TEE WITHOUT CARDIOVERSION N/A 11/03/2016   Procedure: TRANSESOPHAGEAL ECHOCARDIOGRAM (TEE);  Surgeon: Sherren Mocha, MD;  Location: [place];  Service: Open Heart Surgery;  Laterality: N/A;   TRANSCATHETER AORTIC VALVE REPLACEMENT, TRANSFEMORAL N/A 11/03/2016   Procedure: TRANSCATHETER AORTIC VALVE REPLACEMENT, TRANSFEMORAL;  Surgeon: Sherren Mocha, MD;  Location: [place];  Service: Open Heart Surgery;  Laterality: N/A;   TRANSTHORACIC ECHOCARDIOGRAM  12/2013   EF 0000000, grade 1 diastolic dysfunction, high ventricular  filling pressure; severe AV stenosis; calcified MV annulus; LA & RA mildly dilated; atrial septal aneurysm     Family History  Problem Relation Age of Onset   Lung disease Mother    Diabetes Mother    Colon cancer Father    Diabetes Sister    CAD Sister      Social History   Socioeconomic History   Marital status: Married    Spouse name: Not on file   Number of children: 2   Years of education: GED   Highest education level: Not on file  Occupational History   Not on file  Tobacco Use   Smoking status: Former    Packs/day: 1.00    Years: 5.00    Total pack years: 5.00    Types: Cigarettes    Quit date: 07/20/1973    Years since quitting: 49.6    Smokeless tobacco: Never  Vaping Use   Vaping Use: Never used  Substance and Sexual Activity   Alcohol use: No   Drug use: No   Sexual activity: Not on file  Other Topics Concern   Not on file  Social History Narrative   Not on file   Social Determinants of Health   Financial Resource Strain: Not on file  Food Insecurity: Not on file  Transportation Needs: Not on file  Physical Activity: Not on file  Stress: Not on file  Social Connections: Not on file  Intimate Partner Violence: Not on file     BP (!) 140/76   Pulse 72   Ht '5\' 10"'$  (1.778 m)   Wt 156 lb 6.4 oz (70.9 kg)   SpO2 97%   BMI 22.44 kg/m   Physical Exam:  Well appearing NAD HEENT: Unremarkable Neck:  No JVD, no thyromegally Lymphatics:  No adenopathy Back:  No CVA tenderness Lungs:  Clear HEART:  Regular rate rhythm, no murmurs, no rubs, no clicks Abd:  soft, positive bowel sounds, no organomegally, no rebound, no guarding Ext:  2 plus pulses, no edema, no cyanosis, no clubbing Skin:  No rashes no nodules Neuro:  CN II through XII intact, motor grossly intact  EKG  DEVICE  Normal device function.  See PaceArt for details.   Assess/Plan: 1. CHB - he is only pacing 1/3 of the time. His conduction has improved since his TAVR. 2. AS - he is s/p TAVR and his exam is reassuring today. 3. HTN - his bp is improved today. 4. PPM - his biotronik DDD PM is working normally.    Carleene Overlie Maple Odaniel,MD

## 2023-03-10 DIAGNOSIS — K080 Exfoliation of teeth due to systemic causes: Secondary | ICD-10-CM | POA: Diagnosis not present

## 2023-03-21 DIAGNOSIS — I10 Essential (primary) hypertension: Secondary | ICD-10-CM | POA: Diagnosis not present

## 2023-03-21 DIAGNOSIS — E1122 Type 2 diabetes mellitus with diabetic chronic kidney disease: Secondary | ICD-10-CM | POA: Diagnosis not present

## 2023-03-21 DIAGNOSIS — I6523 Occlusion and stenosis of bilateral carotid arteries: Secondary | ICD-10-CM | POA: Diagnosis not present

## 2023-03-21 DIAGNOSIS — E785 Hyperlipidemia, unspecified: Secondary | ICD-10-CM | POA: Diagnosis not present

## 2023-03-23 DIAGNOSIS — E785 Hyperlipidemia, unspecified: Secondary | ICD-10-CM | POA: Diagnosis not present

## 2023-03-23 DIAGNOSIS — Z0000 Encounter for general adult medical examination without abnormal findings: Secondary | ICD-10-CM | POA: Diagnosis not present

## 2023-03-23 DIAGNOSIS — E113293 Type 2 diabetes mellitus with mild nonproliferative diabetic retinopathy without macular edema, bilateral: Secondary | ICD-10-CM | POA: Diagnosis not present

## 2023-03-23 DIAGNOSIS — Z79899 Other long term (current) drug therapy: Secondary | ICD-10-CM | POA: Diagnosis not present

## 2023-03-23 DIAGNOSIS — E039 Hypothyroidism, unspecified: Secondary | ICD-10-CM | POA: Diagnosis not present

## 2023-03-23 DIAGNOSIS — N183 Chronic kidney disease, stage 3 unspecified: Secondary | ICD-10-CM | POA: Diagnosis not present

## 2023-03-23 DIAGNOSIS — N1831 Chronic kidney disease, stage 3a: Secondary | ICD-10-CM | POA: Diagnosis not present

## 2023-03-23 DIAGNOSIS — E1122 Type 2 diabetes mellitus with diabetic chronic kidney disease: Secondary | ICD-10-CM | POA: Diagnosis not present

## 2023-03-23 DIAGNOSIS — D638 Anemia in other chronic diseases classified elsewhere: Secondary | ICD-10-CM | POA: Diagnosis not present

## 2023-04-13 ENCOUNTER — Ambulatory Visit (INDEPENDENT_AMBULATORY_CARE_PROVIDER_SITE_OTHER): Payer: Medicare Other

## 2023-04-13 DIAGNOSIS — I442 Atrioventricular block, complete: Secondary | ICD-10-CM

## 2023-04-14 LAB — CUP PACEART REMOTE DEVICE CHECK
Date Time Interrogation Session: 20240423081430
Implantable Lead Connection Status: 753985
Implantable Lead Connection Status: 753985
Implantable Lead Implant Date: 20171116
Implantable Lead Implant Date: 20171116
Implantable Lead Location: 753859
Implantable Lead Location: 753860
Implantable Lead Model: 377
Implantable Lead Model: 377
Implantable Lead Serial Number: 49591424
Implantable Lead Serial Number: 49679947
Implantable Pulse Generator Implant Date: 20171116
Pulse Gen Model: 407145
Pulse Gen Serial Number: 68853186

## 2023-04-21 DIAGNOSIS — K080 Exfoliation of teeth due to systemic causes: Secondary | ICD-10-CM | POA: Diagnosis not present

## 2023-05-12 NOTE — Progress Notes (Signed)
Remote pacemaker transmission.   

## 2023-05-21 DIAGNOSIS — H524 Presbyopia: Secondary | ICD-10-CM | POA: Diagnosis not present

## 2023-06-23 DIAGNOSIS — E785 Hyperlipidemia, unspecified: Secondary | ICD-10-CM | POA: Diagnosis not present

## 2023-06-23 DIAGNOSIS — E113293 Type 2 diabetes mellitus with mild nonproliferative diabetic retinopathy without macular edema, bilateral: Secondary | ICD-10-CM | POA: Diagnosis not present

## 2023-06-23 DIAGNOSIS — E1122 Type 2 diabetes mellitus with diabetic chronic kidney disease: Secondary | ICD-10-CM | POA: Diagnosis not present

## 2023-06-23 DIAGNOSIS — N183 Chronic kidney disease, stage 3 unspecified: Secondary | ICD-10-CM | POA: Diagnosis not present

## 2023-06-23 DIAGNOSIS — D638 Anemia in other chronic diseases classified elsewhere: Secondary | ICD-10-CM | POA: Diagnosis not present

## 2023-07-13 ENCOUNTER — Ambulatory Visit (INDEPENDENT_AMBULATORY_CARE_PROVIDER_SITE_OTHER): Payer: Medicare Other

## 2023-07-13 DIAGNOSIS — I442 Atrioventricular block, complete: Secondary | ICD-10-CM | POA: Diagnosis not present

## 2023-07-15 LAB — CUP PACEART REMOTE DEVICE CHECK
Battery Voltage: 55
Date Time Interrogation Session: 20240723092129
Implantable Lead Connection Status: 753985
Implantable Lead Connection Status: 753985
Implantable Lead Implant Date: 20171116
Implantable Lead Implant Date: 20171116
Implantable Lead Location: 753859
Implantable Lead Location: 753860
Implantable Lead Model: 377
Implantable Lead Serial Number: 49591424
Implantable Lead Serial Number: 49679947
Pulse Gen Model: 407145
Pulse Gen Serial Number: 68853186

## 2023-07-30 NOTE — Progress Notes (Signed)
Remote pacemaker transmission.   

## 2023-08-18 DIAGNOSIS — K080 Exfoliation of teeth due to systemic causes: Secondary | ICD-10-CM | POA: Diagnosis not present

## 2023-09-23 DIAGNOSIS — E785 Hyperlipidemia, unspecified: Secondary | ICD-10-CM | POA: Diagnosis not present

## 2023-09-23 DIAGNOSIS — E113293 Type 2 diabetes mellitus with mild nonproliferative diabetic retinopathy without macular edema, bilateral: Secondary | ICD-10-CM | POA: Diagnosis not present

## 2023-09-23 DIAGNOSIS — N183 Chronic kidney disease, stage 3 unspecified: Secondary | ICD-10-CM | POA: Diagnosis not present

## 2023-09-23 DIAGNOSIS — E1122 Type 2 diabetes mellitus with diabetic chronic kidney disease: Secondary | ICD-10-CM | POA: Diagnosis not present

## 2023-09-23 DIAGNOSIS — Z23 Encounter for immunization: Secondary | ICD-10-CM | POA: Diagnosis not present

## 2023-09-23 DIAGNOSIS — E039 Hypothyroidism, unspecified: Secondary | ICD-10-CM | POA: Diagnosis not present

## 2023-09-23 DIAGNOSIS — Z79899 Other long term (current) drug therapy: Secondary | ICD-10-CM | POA: Diagnosis not present

## 2023-10-12 ENCOUNTER — Ambulatory Visit (INDEPENDENT_AMBULATORY_CARE_PROVIDER_SITE_OTHER): Payer: Medicare Other

## 2023-10-12 DIAGNOSIS — I442 Atrioventricular block, complete: Secondary | ICD-10-CM

## 2023-10-14 LAB — CUP PACEART REMOTE DEVICE CHECK
Battery Remaining Percentage: 55 %
Brady Statistic RA Percent Paced: 55 %
Brady Statistic RV Percent Paced: 47 %
Date Time Interrogation Session: 20241022071925
Implantable Lead Connection Status: 753985
Implantable Lead Connection Status: 753985
Implantable Lead Implant Date: 20171116
Implantable Lead Implant Date: 20171116
Implantable Lead Location: 753859
Implantable Lead Location: 753860
Implantable Lead Model: 377
Implantable Lead Model: 377
Implantable Lead Serial Number: 49591424
Implantable Lead Serial Number: 49679947
Implantable Pulse Generator Implant Date: 20171116
Lead Channel Impedance Value: 488 Ohm
Lead Channel Impedance Value: 566 Ohm
Lead Channel Pacing Threshold Amplitude: 0.6 V
Lead Channel Pacing Threshold Pulse Width: 0.4 ms
Lead Channel Sensing Intrinsic Amplitude: 2.2 mV
Lead Channel Sensing Intrinsic Amplitude: 8.5 mV
Lead Channel Setting Pacing Amplitude: 2 V
Lead Channel Setting Pacing Amplitude: 2.4 V
Lead Channel Setting Pacing Pulse Width: 0.4 ms
Pulse Gen Model: 407145
Pulse Gen Serial Number: 68853186

## 2023-10-27 DIAGNOSIS — K080 Exfoliation of teeth due to systemic causes: Secondary | ICD-10-CM | POA: Diagnosis not present

## 2023-10-29 NOTE — Progress Notes (Signed)
Remote pacemaker transmission.   

## 2023-11-16 DIAGNOSIS — H35371 Puckering of macula, right eye: Secondary | ICD-10-CM | POA: Diagnosis not present

## 2023-11-29 ENCOUNTER — Ambulatory Visit (HOSPITAL_COMMUNITY)
Admission: RE | Admit: 2023-11-29 | Discharge: 2023-11-29 | Disposition: A | Discharge status: Home / Self Care | Payer: Medicare Other | Source: Ambulatory Visit | Attending: Cardiovascular Disease | Admitting: Cardiovascular Disease

## 2023-11-29 DIAGNOSIS — I10 Essential (primary) hypertension: Secondary | ICD-10-CM | POA: Diagnosis not present

## 2023-11-29 DIAGNOSIS — I2510 Atherosclerotic heart disease of native coronary artery without angina pectoris: Secondary | ICD-10-CM | POA: Insufficient documentation

## 2023-11-29 DIAGNOSIS — Z952 Presence of prosthetic heart valve: Secondary | ICD-10-CM | POA: Diagnosis not present

## 2023-11-29 DIAGNOSIS — I442 Atrioventricular block, complete: Secondary | ICD-10-CM | POA: Diagnosis not present

## 2023-11-29 DIAGNOSIS — I6523 Occlusion and stenosis of bilateral carotid arteries: Secondary | ICD-10-CM | POA: Insufficient documentation

## 2023-11-29 DIAGNOSIS — E782 Mixed hyperlipidemia: Secondary | ICD-10-CM | POA: Insufficient documentation

## 2023-12-13 ENCOUNTER — Encounter: Payer: Self-pay | Admitting: Cardiovascular Disease

## 2023-12-13 ENCOUNTER — Ambulatory Visit: Payer: Medicare Other | Attending: Cardiovascular Disease | Admitting: Cardiovascular Disease

## 2023-12-13 VITALS — BP 124/74 | HR 70 | Ht 70.0 in | Wt 149.0 lb

## 2023-12-13 DIAGNOSIS — E782 Mixed hyperlipidemia: Secondary | ICD-10-CM | POA: Diagnosis not present

## 2023-12-13 DIAGNOSIS — I6523 Occlusion and stenosis of bilateral carotid arteries: Secondary | ICD-10-CM | POA: Diagnosis not present

## 2023-12-13 DIAGNOSIS — I459 Conduction disorder, unspecified: Secondary | ICD-10-CM

## 2023-12-13 DIAGNOSIS — I350 Nonrheumatic aortic (valve) stenosis: Secondary | ICD-10-CM

## 2023-12-13 DIAGNOSIS — I2510 Atherosclerotic heart disease of native coronary artery without angina pectoris: Secondary | ICD-10-CM

## 2023-12-13 DIAGNOSIS — I10 Essential (primary) hypertension: Secondary | ICD-10-CM

## 2023-12-13 NOTE — Assessment & Plan Note (Signed)
History of CAD status post bypass grafting in 2000 with a LIMA to his LAD, vein to the first obtuse marginal branch, second obtuse marginal branch and to the RCA.  He had right left heart cath performed 10/05/2016 because of progression of his aortic stenosis revealing patent grafts.  He denies chest pain.

## 2023-12-13 NOTE — Assessment & Plan Note (Signed)
History of hyperlipidemia not on statin therapy with lipid profile performed 03/23/2023 revealing a total cholesterol 129.

## 2023-12-13 NOTE — Assessment & Plan Note (Signed)
History of essential hypertension with blood pressure measured today at 124/74.  He is on amlodipine, carvedilol and Lotensin.

## 2023-12-13 NOTE — Patient Instructions (Signed)
Medication Instructions:  Your physician recommends that you continue on your current medications as directed. Please refer to the Current Medication list given to you today.  *If you need a refill on your cardiac medications before your next appointment, please call your pharmacy*   Testing/Procedures: Your physician has requested that you have a carotid duplex. This test is an ultrasound of the carotid arteries in your neck. It looks at blood flow through these arteries that supply the brain with blood. Allow one hour for this exam. There are no restrictions or special instructions. This will take place at [address] [facility], [address]. **To do in December 2025**  Please note: We ask at that you not bring children with you during ultrasound (echo/ vascular) testing. Due to room size and safety concerns, children are not allowed in the ultrasound rooms during exams. Our front office staff cannot provide observation of children in our lobby area while testing is being conducted. An adult accompanying a patient to their appointment will only be allowed in the ultrasound room at the discretion of the ultrasound technician under special circumstances. We apologize for any inconvenience.    Follow-Up: At [facility], you and your health needs are our priority.  As part of our continuing mission to provide you with exceptional heart care, we have created designated Provider Care Teams.  These Care Teams include your primary Cardiologist (physician) and Advanced Practice Providers (APPs -  Physician Assistants and Nurse Practitioners) who all work together to provide you with the care you need, when you need it.  We recommend signing up for the patient portal called "MyChart".  Sign up information is provided on this After Visit Summary.  MyChart is used to connect with patients for Virtual Visits (Telemedicine).  Patients are able to view lab/test results, encounter notes, upcoming appointments,  etc.  Non-urgent messages can be sent to your provider as well.   To learn more about what you can do with MyChart, go to [email or web address].    Your next appointment:   12 month(s)  Provider:   Nanetta Batty, MD

## 2023-12-13 NOTE — Assessment & Plan Note (Signed)
History of moderate bilateral ICA stenosis by duplex ultrasound performed 11/29/2023.  This will be repeated on an annual basis.

## 2023-12-13 NOTE — Assessment & Plan Note (Signed)
History of heart block at the time of TAVR status post permanent transvenous pacemaker implantation by Dr. Ladona Ridgel 11/05/2016.

## 2023-12-13 NOTE — Progress Notes (Signed)
12/13/2023 Thomas English   [birth year]  [id number]  Primary Physician Street, Thomas Coup, MD Primary Cardiologist: Runell Gess MD FACP, [place], [place], [place]  HPI:  Thomas English is a 89 y.o.  mildly overweight, married Caucasian male, father of 2, grandfather to 1 grandchild who I last saw 12/09/2022. He has a history of CAD status post coronary artery bypass grafting in  2000, with a LIMA to his LAD, a vein graft to the first obtuse marginal branch, second obtuse marginal branch and to the RCA. His other problems include hypertension, hyperlipidemia and noninsulin-requiring diabetes. He does have bilateral internal carotid artery stenosis left greater than right which has remained stable over the last 6 months. He is neurologically asymptomatic. He also has mild aortic stenosis with valve area of 1.4 cm squared. His last Myoview performed October 06, 2011, was low risk. He is asymptomatic. Dr. Sheria Lang follows his lipid profile. He has had progressive aortic stenosis with his most recent echo performed 02/28/16 were revealing preserved LV function .  He also has moderate bilateral ICA stenosis by duplex ultrasound. When I saw him back in October of last year his dyspnea had progressed as had his aortic stenosis. I performed right and left heart cath on him 10/05/16 revealing patent grafts and he ultimately underwent TAV are Edwards CPE in 3 valve implantation by Drs. 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 Ladona Ridgel this follow-up 2-D echo performed 12/10/16 revealed normal LV function with a well-functioning bioprosthesis. He feels clinically improved, denies shortness of breath and has more energy.    Since I saw him  a year ago he remains stable.  He denies chest pain or shortness of breath.  His 2D echo performed a year ago showed normal LV function with a well-functioning aortic  bioprosthesis and carotid Dopplers revealed moderate and stable bilateral ICA stenosis.     Current Meds  Medication Sig   amLODipine (NORVASC) 10 MG tablet Take 10 mg by mouth daily with breakfast.    amoxicillin (AMOXIL) 500 MG capsule Take 4 tablets I hour before dental procedure   benazepril (LOTENSIN) 20 MG tablet Take 20 mg by mouth daily with breakfast.    carvedilol (COREG) 6.25 MG tablet Take 6.25 mg by mouth 2 (two) times daily with a meal.   Cholecalciferol (VITAMIN D3) 125 MCG (5000 UT) CAPS Take by mouth.   clopidogrel (PLAVIX) 75 MG tablet Take 75 mg by mouth daily.    levothyroxine (SYNTHROID, LEVOTHROID) 100 MCG tablet Take 100 mcg by mouth at bedtime.   simvastatin (ZOCOR) 20 MG tablet Take 20 mg by mouth every evening.    [DISCONTINUED] metFORMIN (GLUCOPHAGE) 500 MG tablet Take 1 tablet by mouth 2 (two) times daily.     No Known Allergies  Social History   Socioeconomic History   Marital status: Married    Spouse name: Not on file   Number of children: 2   Years of education: GED   Highest education level: Not on file  Occupational History   Not on file  Tobacco Use   Smoking status: Former    Current packs/day: 0.00    Average packs/day: 1 pack/day for 5.0 years (5.0 ttl pk-yrs)    Types: Cigarettes    Start date: 07/20/1968    Quit date: 07/20/1973    Years since quitting: 50.4   Smokeless tobacco: Never  Vaping Use   Vaping  status: Never Used  Substance and Sexual Activity   Alcohol use: No   Drug use: No   Sexual activity: Not on file  Other Topics Concern   Not on file  Social History Narrative   Not on file   Social Drivers of Health   Financial Resource Strain: Not on file  Food Insecurity: Not on file  Transportation Needs: Not on file  Physical Activity: Not on file  Stress: Not on file  Social Connections: Not on file  Intimate Partner Violence: Not on file     Review of Systems: General: negative for chills, fever, night sweats or  weight changes.  Cardiovascular: negative for chest pain, dyspnea on exertion, edema, orthopnea, palpitations, paroxysmal nocturnal dyspnea or shortness of breath Dermatological: negative for rash Respiratory: negative for cough or wheezing Urologic: negative for hematuria Abdominal: negative for nausea, vomiting, diarrhea, bright red blood per rectum, melena, or hematemesis Neurologic: negative for visual changes, syncope, or dizziness All other systems reviewed and are otherwise negative except as noted above.    Blood pressure 124/74, pulse 70, height 5\' 10"  (1.778 m), weight 149 lb (67.6 kg), SpO2 94%.  General appearance: alert and no distress Neck: no adenopathy, no JVD, supple, symmetrical, trachea midline, thyroid not enlarged, symmetric, no tenderness/mass/nodules, and bilateral carotid bruits Lungs: clear to auscultation bilaterally Heart: 2/6 apical check murmur consistent with aortic stenosis Extremities: extremities normal, atraumatic, no cyanosis or edema Pulses: 2+ and symmetric Skin: Skin color, texture, turgor normal. No rashes or lesions Neurologic: Grossly normal  EKG EKG Interpretation Date/Time:  Monday December 13 2023 09:34:07 EST Ventricular Rate:  61 PR Interval:  248 QRS Duration:  94 QT Interval:  392 QTC Calculation: 394 R Axis:   13  Text Interpretation: Sinus rhythm with 1st degree A-V block Nonspecific T wave abnormality When compared with ECG of 06-Nov-2016 04:36, Nonspecific T wave abnormality now evident in Anterior leads QT has shortened Confirmed by Nanetta Batty [phone]) on 12/13/2023 10:02:51 AM    ASSESSMENT AND PLAN:   Carotid stenosis, bil, last dopplers 12/2013 50-69% History of moderate bilateral ICA stenosis by duplex ultrasound performed 11/29/2023.  This will be repeated on an annual basis.  Essential hypertension History of essential hypertension with blood pressure measured today at 124/74.  He is on amlodipine, carvedilol and  Lotensin.  Hyperlipidemia History of hyperlipidemia not on statin therapy with lipid profile performed 03/23/2023 revealing a total cholesterol 129.  Coronary artery disease History of CAD status post bypass grafting in 2000 with a LIMA to his LAD, vein to the first obtuse marginal branch, second obtuse marginal branch and to the RCA.  He had right left heart cath performed 10/05/2016 because of progression of his aortic stenosis revealing patent grafts.  He denies chest pain.  Severe aortic stenosis History of severe aortic stenosis status post TAVR performed by Drs. Cooper and [facility] 11/03/2016 with an Edwards 26 mm SAPIEN bioprosthetic valve.  His most recent 2D echo performed 01/13/2023 revealed normal LV systolic function with a well-functioning aortic bioprosthesis.  This will be repeated on an annual basis.  Heart block s/p Biotronic PPM 10/2016 History of heart block at the time of TAVR status post permanent transvenous pacemaker implantation by Dr. Ladona Ridgel 11/05/2016.     Runell Gess MD FACP,FACC,FAHA, [facility] 12/13/2023 10:11 AM

## 2023-12-13 NOTE — Assessment & Plan Note (Signed)
History of severe aortic stenosis status post TAVR performed by Drs. Cooper and [facility] 11/03/2016 with an Edwards 26 mm SAPIEN bioprosthetic valve.  His most recent 2D echo performed 01/13/2023 revealed normal LV systolic function with a well-functioning aortic bioprosthesis.  This will be repeated on an annual basis.

## 2024-01-11 ENCOUNTER — Ambulatory Visit (INDEPENDENT_AMBULATORY_CARE_PROVIDER_SITE_OTHER): Payer: Medicare Other

## 2024-01-11 DIAGNOSIS — I459 Conduction disorder, unspecified: Secondary | ICD-10-CM | POA: Diagnosis not present

## 2024-01-11 LAB — CUP PACEART REMOTE DEVICE CHECK
Date Time Interrogation Session: 20250121080145
Implantable Lead Connection Status: 753985
Implantable Lead Connection Status: 753985
Implantable Lead Implant Date: 20171116
Implantable Lead Implant Date: 20171116
Implantable Lead Location: 753859
Implantable Lead Location: 753860
Implantable Lead Model: 377
Implantable Lead Model: 377
Implantable Lead Serial Number: 49591424
Implantable Lead Serial Number: 49679947
Implantable Pulse Generator Implant Date: 20171116
Pulse Gen Model: 407145
Pulse Gen Serial Number: 68853186

## 2024-01-12 ENCOUNTER — Ambulatory Visit (HOSPITAL_COMMUNITY): Payer: Medicare Other | Attending: Cardiovascular Disease

## 2024-01-12 DIAGNOSIS — Z952 Presence of prosthetic heart valve: Secondary | ICD-10-CM | POA: Insufficient documentation

## 2024-01-12 DIAGNOSIS — I10 Essential (primary) hypertension: Secondary | ICD-10-CM | POA: Insufficient documentation

## 2024-01-12 LAB — ECHOCARDIOGRAM COMPLETE
AR max vel: 2.66 cm2
AV Area VTI: 2.61 cm2
AV Area mean vel: 2.55 cm2
AV Mean grad: 7 mm[Hg]
AV Peak grad: 11.7 mm[Hg]
Ao pk vel: 1.71 m/s
Area-P 1/2: 3.31 cm2
S' Lateral: 2.61 cm

## 2024-02-22 NOTE — Progress Notes (Signed)
 Remote pacemaker transmission.

## 2024-03-10 DIAGNOSIS — E113293 Type 2 diabetes mellitus with mild nonproliferative diabetic retinopathy without macular edema, bilateral: Secondary | ICD-10-CM | POA: Diagnosis not present

## 2024-03-10 DIAGNOSIS — E1122 Type 2 diabetes mellitus with diabetic chronic kidney disease: Secondary | ICD-10-CM | POA: Diagnosis not present

## 2024-03-10 DIAGNOSIS — N183 Chronic kidney disease, stage 3 unspecified: Secondary | ICD-10-CM | POA: Diagnosis not present

## 2024-03-10 DIAGNOSIS — E785 Hyperlipidemia, unspecified: Secondary | ICD-10-CM | POA: Diagnosis not present

## 2024-03-10 DIAGNOSIS — E039 Hypothyroidism, unspecified: Secondary | ICD-10-CM | POA: Diagnosis not present

## 2024-03-13 ENCOUNTER — Ambulatory Visit: Payer: Medicare Other | Attending: Internal Medicine | Admitting: Internal Medicine

## 2024-03-13 ENCOUNTER — Encounter: Payer: Self-pay | Admitting: Internal Medicine

## 2024-03-13 VITALS — BP 132/62 | HR 78 | Ht 70.0 in | Wt 158.0 lb

## 2024-03-13 DIAGNOSIS — I442 Atrioventricular block, complete: Secondary | ICD-10-CM

## 2024-03-13 LAB — CUP PACEART INCLINIC DEVICE CHECK
Battery Remaining Percentage: 50 %
Brady Statistic RA Percent Paced: 51 %
Brady Statistic RV Percent Paced: 48 %
Date Time Interrogation Session: 20250324142120
Implantable Lead Connection Status: 753985
Implantable Lead Connection Status: 753985
Implantable Lead Implant Date: 20171116
Implantable Lead Implant Date: 20171116
Implantable Lead Location: 753859
Implantable Lead Location: 753860
Implantable Lead Model: 377
Implantable Lead Model: 377
Implantable Lead Serial Number: 49591424
Implantable Lead Serial Number: 49679947
Implantable Pulse Generator Implant Date: 20171116
Lead Channel Impedance Value: 546 Ohm
Lead Channel Impedance Value: 546 Ohm
Lead Channel Impedance Value: 760 Ohm
Lead Channel Impedance Value: 760 Ohm
Lead Channel Pacing Threshold Amplitude: 0.4 V
Lead Channel Pacing Threshold Amplitude: 0.4 V
Lead Channel Pacing Threshold Amplitude: 0.9 V
Lead Channel Pacing Threshold Amplitude: 0.9 V
Lead Channel Pacing Threshold Pulse Width: 0.4 ms
Lead Channel Pacing Threshold Pulse Width: 0.4 ms
Lead Channel Pacing Threshold Pulse Width: 0.4 ms
Lead Channel Pacing Threshold Pulse Width: 0.4 ms
Lead Channel Setting Pacing Amplitude: 2 V
Lead Channel Setting Pacing Amplitude: 2.4 V
Lead Channel Setting Pacing Pulse Width: 0.4 ms
Pulse Gen Model: 407145
Pulse Gen Serial Number: 68853186

## 2024-03-13 NOTE — Progress Notes (Signed)
 HPI Thomas English returns today for ongoing evaluation and management of his PPM. He developed CHB after TAVR. He has done well, s/p PPM insertion over 4 years ago. He denies chest pain or sob. He has started back walking more. He has not had edema. No syncope.   No Known Allergies   Current Outpatient Medications  Medication Sig Dispense Refill   amLODipine (NORVASC) 10 MG tablet Take 10 mg by mouth daily with breakfast.   6   amoxicillin (AMOXIL) 500 MG capsule Take 4 tablets I hour before dental procedure 4 capsule 0   benazepril (LOTENSIN) 20 MG tablet Take 20 mg by mouth daily with breakfast.   6   carvedilol (COREG) 6.25 MG tablet Take 6.25 mg by mouth 2 (two) times daily with a meal.     Cholecalciferol (VITAMIN D3) 125 MCG (5000 UT) CAPS Take by mouth.     clopidogrel (PLAVIX) 75 MG tablet Take 75 mg by mouth daily.      levothyroxine (SYNTHROID, LEVOTHROID) 100 MCG tablet Take 100 mcg by mouth at bedtime.  1   simvastatin (ZOCOR) 20 MG tablet Take 20 mg by mouth every evening.      No current facility-administered medications for this visit.     Past Medical History:  Diagnosis Date   Aortic stenosis    a. s/p TAVR on 11/03/16   Arthritis    CAD (coronary artery disease)    a. CAD s/p CABG 2000   b. cath on 10/05/16 with severe native vessel CAD and patent bypass grafts   Carotid stenosis    Diabetes mellitus type 2, noninsulin dependent (HCC)    Heart block AV second degree    a. 10/2016: after TAVR. s/p Biotrionic PPM: (serial number [id number])    Hyperlipidemia    Hypertension    Hypothyroidism     ROS:   All systems reviewed and negative except as noted in the HPI.   Past Surgical History:  Procedure Laterality Date   BACK SURGERY     CARDIAC CATHETERIZATION  11/10/1999   3-vessel disease, recommendation for CABG   CARDIAC CATHETERIZATION N/A 10/05/2016   Procedure: Right/Left Heart Cath and Coronary/Graft Angiography;  Surgeon: Runell Gess,  MD;  Location: [facility] INVASIVE CV LAB;  Service: Cardiovascular;  Laterality: N/A;   CORONARY ARTERY BYPASS GRAFT  11/13/1999   LIMA to LAD and a vein graft to the first OM branch, second OM branch, and to the RCA (Dr. Wayland Salinas)   EP IMPLANTABLE DEVICE N/A 11/05/2016   Procedure: Pacemaker Implant;  Surgeon: Marinus Maw, MD;  Location: [facility] INVASIVE CV LAB;  Service: Cardiovascular;  Laterality: N/A;   EYE SURGERY     eye duct surgery   NM MYOCAR PERF WALL MOTION  09/2013   normal lexiscan myoview   PERIPHERAL VASCULAR CATHETERIZATION N/A 10/05/2016   Procedure: Abdominal Aortogram;  Surgeon: Runell Gess, MD;  Location: [facility] INVASIVE CV LAB;  Service: Cardiovascular;  Laterality: N/A;   TEE WITHOUT CARDIOVERSION N/A 11/03/2016   Procedure: TRANSESOPHAGEAL ECHOCARDIOGRAM (TEE);  Surgeon: Tonny Bollman, MD;  Location: [facility] OR;  Service: Open Heart Surgery;  Laterality: N/A;   TRANSCATHETER AORTIC VALVE REPLACEMENT, TRANSFEMORAL N/A 11/03/2016   Procedure: TRANSCATHETER AORTIC VALVE REPLACEMENT, TRANSFEMORAL;  Surgeon: Tonny Bollman, MD;  Location: [facility] OR;  Service: Open Heart Surgery;  Laterality: N/A;   TRANSTHORACIC ECHOCARDIOGRAM  12/2013   EF 55-60%, grade 1 diastolic dysfunction, high ventricular filling pressure; severe AV stenosis;  calcified MV annulus; LA & RA mildly dilated; atrial septal aneurysm     Family History  Problem Relation Age of Onset   Lung disease Mother    Diabetes Mother    Colon cancer Father    Diabetes Sister    CAD Sister      Social History   Socioeconomic History   Marital status: Married    Spouse name: Not on file   Number of children: 2   Years of education: GED   Highest education level: Not on file  Occupational History   Not on file  Tobacco Use   Smoking status: Former    Current packs/day: 0.00    Average packs/day: 1 pack/day for 5.0 years (5.0 ttl pk-yrs)    Types: Cigarettes    Start date: 07/20/1968    Quit date: 07/20/1973    Years  since quitting: 50.6   Smokeless tobacco: Never  Vaping Use   Vaping status: Never Used  Substance and Sexual Activity   Alcohol use: No   Drug use: No   Sexual activity: Not on file  Other Topics Concern   Not on file  Social History Narrative   Not on file   Social Drivers of Health   Financial Resource Strain: Not on file  Food Insecurity: Not on file  Transportation Needs: Not on file  Physical Activity: Not on file  Stress: Not on file  Social Connections: Not on file  Intimate Partner Violence: Not on file     BP 132/62   Pulse 78   Ht 5\' 10"  (1.778 m)   Wt 158 lb (71.7 kg)   SpO2 94%   BMI 22.67 kg/m   Physical Exam:  Well appearing NAD HEENT: Unremarkable Neck:  No JVD, no thyromegally Lymphatics:  No adenopathy Back:  No CVA tenderness Lungs:  Clear with no wheezes HEART:  Regular rate rhythm, no murmurs, no rubs, no clicks Abd:  soft, positive bowel sounds, no organomegally, no rebound, no guarding Ext:  2 plus pulses, no edema, no cyanosis, no clubbing Skin:  No rashes no nodules Neuro:  CN II through XII intact, motor grossly intact  DEVICE  Normal device function.  See PaceArt for details.   Assess/Plan:  1. CHB - he is only pacing 1/3 of the time. His conduction has improved since his TAVR. 2. AS - he is s/p TAVR and his exam is reassuring today. 3. HTN - his bp is improved today. 4. PPM - his biotronik DDD PM is working normally.    Thomas Gowda Ott Zimmerle,MD

## 2024-03-13 NOTE — Patient Instructions (Addendum)
 Medication Instructions:  Your physician recommends that you continue on your current medications as directed. Please refer to the Current Medication list given to you today.  *If you need a refill on your cardiac medications before your next appointment, please call your pharmacy*  Lab Work: None ordered.  You may go to any [facility] Location for your lab work:  [facility] - [address] ([facility]) - [address] [phone] [address]  [place] - [address]   [place]  - [address]   [place] - [address] - [address] [employer]  - [address] ([facility]   If you have labs (blood work) drawn today and your tests are completely normal, you will receive your results only by: [employer] (if you have MyChart)  If you have any lab test that is abnormal or we need to change your treatment, we will call you or send a MyChart message to review the results.  Testing/Procedures: None ordered.  Follow-Up: At [facility], you and your health needs are our priority.  As part of our continuing mission to provide you with exceptional heart care, we have created designated Provider Care Teams.  These Care Teams include your primary Cardiologist (physician) and Advanced Practice Providers (APPs -  Physician Assistants and Nurse Practitioners) who all work together to provide you with the care you need, when you need it.   Your next appointment:   1 year(s)  The format for your next appointment:   In Person  Provider:   Dr Virl Son one of the following Advanced Practice Providers on your designated Care Team:   Francis Dowse, PA-C Casimiro Needle "Mardelle Matte" [place], [place] Earnest Rosier, NP  Note: Remote monitoring is used to monitor your Pacemaker/ ICD from home. This monitoring reduces the number of office visits required to check your  device to one time per year. It allows [place] to keep an eye on the functioning of your device to ensure it is working properly.            Valet parking services will be available as well.

## 2024-04-11 ENCOUNTER — Ambulatory Visit (INDEPENDENT_AMBULATORY_CARE_PROVIDER_SITE_OTHER): Payer: Medicare Other

## 2024-04-11 DIAGNOSIS — I442 Atrioventricular block, complete: Secondary | ICD-10-CM | POA: Diagnosis not present

## 2024-04-12 LAB — CUP PACEART REMOTE DEVICE CHECK
Date Time Interrogation Session: 20250422083746
Implantable Lead Connection Status: 753985
Implantable Lead Connection Status: 753985
Implantable Lead Implant Date: 20171116
Implantable Lead Implant Date: 20171116
Implantable Lead Location: 753859
Implantable Lead Location: 753860
Implantable Lead Model: 377
Implantable Lead Model: 377
Implantable Lead Serial Number: 49591424
Implantable Lead Serial Number: 49679947
Implantable Pulse Generator Implant Date: 20171116
Pulse Gen Model: 407145
Pulse Gen Serial Number: 68853186

## 2024-04-25 DIAGNOSIS — K080 Exfoliation of teeth due to systemic causes: Secondary | ICD-10-CM | POA: Diagnosis not present

## 2024-05-25 NOTE — Progress Notes (Signed)
 Remote pacemaker transmission.

## 2024-05-25 NOTE — Addendum Note (Signed)
 Addended by: Lott Rouleau A on: 05/25/2024 03:22 PM   Modules accepted: Orders

## 2024-05-31 DIAGNOSIS — H524 Presbyopia: Secondary | ICD-10-CM | POA: Diagnosis not present

## 2024-06-12 DIAGNOSIS — K080 Exfoliation of teeth due to systemic causes: Secondary | ICD-10-CM | POA: Diagnosis not present

## 2024-07-11 ENCOUNTER — Ambulatory Visit (INDEPENDENT_AMBULATORY_CARE_PROVIDER_SITE_OTHER): Payer: Medicare Other

## 2024-07-11 DIAGNOSIS — I442 Atrioventricular block, complete: Secondary | ICD-10-CM

## 2024-07-12 ENCOUNTER — Ambulatory Visit: Payer: Self-pay | Admitting: Internal Medicine

## 2024-07-12 LAB — CUP PACEART REMOTE DEVICE CHECK
Battery Voltage: 50
Date Time Interrogation Session: 20250722100334
Implantable Lead Connection Status: 753985
Implantable Lead Connection Status: 753985
Implantable Lead Implant Date: 20171116
Implantable Lead Implant Date: 20171116
Implantable Lead Location: 753859
Implantable Lead Location: 753860
Implantable Lead Model: 377
Implantable Lead Model: 377
Implantable Lead Serial Number: 49591424
Implantable Lead Serial Number: 49679947
Implantable Pulse Generator Implant Date: 20171116
Pulse Gen Model: 407145
Pulse Gen Serial Number: 68853186

## 2024-08-28 DIAGNOSIS — E1122 Type 2 diabetes mellitus with diabetic chronic kidney disease: Secondary | ICD-10-CM | POA: Diagnosis not present

## 2024-08-28 DIAGNOSIS — Z0000 Encounter for general adult medical examination without abnormal findings: Secondary | ICD-10-CM | POA: Diagnosis not present

## 2024-08-28 DIAGNOSIS — E785 Hyperlipidemia, unspecified: Secondary | ICD-10-CM | POA: Diagnosis not present

## 2024-08-28 DIAGNOSIS — N183 Chronic kidney disease, stage 3 unspecified: Secondary | ICD-10-CM | POA: Diagnosis not present

## 2024-08-28 DIAGNOSIS — Z79899 Other long term (current) drug therapy: Secondary | ICD-10-CM | POA: Diagnosis not present

## 2024-08-28 DIAGNOSIS — Z1339 Encounter for screening examination for other mental health and behavioral disorders: Secondary | ICD-10-CM | POA: Diagnosis not present

## 2024-08-28 DIAGNOSIS — E039 Hypothyroidism, unspecified: Secondary | ICD-10-CM | POA: Diagnosis not present

## 2024-09-22 NOTE — Progress Notes (Signed)
 Remote PPM Transmission

## 2024-10-10 ENCOUNTER — Ambulatory Visit: Payer: Medicare Other

## 2024-10-10 DIAGNOSIS — I442 Atrioventricular block, complete: Secondary | ICD-10-CM | POA: Diagnosis not present

## 2024-10-11 DIAGNOSIS — E039 Hypothyroidism, unspecified: Secondary | ICD-10-CM | POA: Diagnosis not present

## 2024-10-12 ENCOUNTER — Ambulatory Visit: Payer: Self-pay | Admitting: Internal Medicine

## 2024-10-12 LAB — CUP PACEART REMOTE DEVICE CHECK
Date Time Interrogation Session: 20251021090415
Implantable Lead Connection Status: 753985
Implantable Lead Connection Status: 753985
Implantable Lead Implant Date: 20171116
Implantable Lead Implant Date: 20171116
Implantable Lead Location: 753859
Implantable Lead Location: 753860
Implantable Lead Model: 377
Implantable Lead Model: 377
Implantable Lead Serial Number: 49591424
Implantable Lead Serial Number: 49679947
Implantable Pulse Generator Implant Date: 20171116
Pulse Gen Model: 407145
Pulse Gen Serial Number: 68853186

## 2024-10-13 NOTE — Progress Notes (Signed)
 Remote PPM Transmission

## 2024-10-26 ENCOUNTER — Encounter (HOSPITAL_COMMUNITY): Payer: Self-pay

## 2024-11-28 ENCOUNTER — Ambulatory Visit: Payer: Self-pay | Admitting: Cardiovascular Disease

## 2024-11-28 ENCOUNTER — Ambulatory Visit (HOSPITAL_COMMUNITY)
Admission: RE | Admit: 2024-11-28 | Discharge: 2024-11-28 | Disposition: A | Source: Ambulatory Visit | Attending: Cardiovascular Disease | Admitting: Cardiovascular Disease

## 2024-11-28 DIAGNOSIS — I6523 Occlusion and stenosis of bilateral carotid arteries: Secondary | ICD-10-CM | POA: Diagnosis not present

## 2024-11-28 DIAGNOSIS — I10 Essential (primary) hypertension: Secondary | ICD-10-CM

## 2024-12-26 ENCOUNTER — Ambulatory Visit: Attending: Cardiovascular Disease | Admitting: Cardiovascular Disease

## 2024-12-26 ENCOUNTER — Encounter: Payer: Self-pay | Admitting: Cardiovascular Disease

## 2024-12-26 VITALS — BP 146/70 | HR 64 | Ht 70.0 in

## 2024-12-26 DIAGNOSIS — I459 Conduction disorder, unspecified: Secondary | ICD-10-CM | POA: Diagnosis not present

## 2024-12-26 DIAGNOSIS — I6523 Occlusion and stenosis of bilateral carotid arteries: Secondary | ICD-10-CM | POA: Diagnosis not present

## 2024-12-26 DIAGNOSIS — I350 Nonrheumatic aortic (valve) stenosis: Secondary | ICD-10-CM

## 2024-12-26 DIAGNOSIS — I2510 Atherosclerotic heart disease of native coronary artery without angina pectoris: Secondary | ICD-10-CM

## 2024-12-26 DIAGNOSIS — E782 Mixed hyperlipidemia: Secondary | ICD-10-CM | POA: Diagnosis not present

## 2024-12-26 DIAGNOSIS — I10 Essential (primary) hypertension: Secondary | ICD-10-CM | POA: Diagnosis not present

## 2024-12-26 NOTE — Assessment & Plan Note (Signed)
 History of hyperlipidemia on statin therapy with lipid profile performed 03/10/2024 revealing total cholesterol 126.

## 2024-12-26 NOTE — Assessment & Plan Note (Signed)
 History of complete pleat heart block at the time of TAVR status post permanent transvenous pacemaker implantation by Dr. Waddell who is followed him on an annual basis.

## 2024-12-26 NOTE — Assessment & Plan Note (Signed)
 Moderate bilateral ICA stenosis by duplex ultrasound most recently checked 11/28/2024.  Given his age and lack of symptoms I do not feel compelled to continue to follow this.

## 2024-12-26 NOTE — Progress Notes (Signed)
 "     12/26/2024 Thomas English   20-Jan-1932  [id number]  Primary Physician Street, Thomas HERO, MD Primary Cardiologist: Thomas JINNY Lesches MD FACP, [place], FAHA, FSCAI  HPI:  Thomas English is a 89 y.o.  mildly overweight, recently widowed (wife of 56 years passed away Jan 14, 2024) Caucasian male, father of 2, grandfather to 1 grandchild who I last saw 12/13/2023.  He is accompanied by his son-in-law Thomas English today.  He has a history of CAD status post coronary artery bypass grafting in  2000, with a LIMA to his LAD, a vein graft to the first obtuse marginal branch, second obtuse marginal branch and to the RCA. His other problems include hypertension, hyperlipidemia and noninsulin-requiring diabetes. He does have bilateral internal carotid artery stenosis left greater than right which has remained stable over the last 6 months. He is neurologically asymptomatic. He also has mild aortic stenosis with valve area of 1.4 cm squared. His last Myoview performed October 06, 2011, was low risk. He is asymptomatic. Dr. Ole follows his lipid profile. He has had progressive aortic stenosis with his most recent echo performed 02/28/16 were revealing preserved LV function .  He also has moderate bilateral ICA stenosis by duplex ultrasound. When I saw him back in October of last year his dyspnea had progressed as had his aortic stenosis. I performed right and left heart cath on him 10/05/16 revealing patent grafts and he ultimately underwent TAV are Edwards CPE in 3 valve implantation by Drs. Thomas English and Thomas English 11/03/16 which was complicated postprocedure by transient heart block requiring permanent transvenous pacemaker insertion by Dr. Waddell this follow-up 2-D echo performed 12/10/16 revealed normal LV function with a well-functioning bioprosthesis. He feels clinically improved, denies shortness of breath and has more energy.    Since I saw him  a year ago he remains stable.  Unfortunately, his wife passed  away 1 year ago and he is currently living alone although has a lot of help from family on a daily basis.  He still drives.  In addition, he still does yard work closely stenosis grafts.  He denies chest pain or shortness of breath.  His 2D echo performed a year ago showed normal LV function with a well-functioning aortic bioprosthesis and carotid Dopplers revealed moderate and stable bilateral ICA stenosis.   Active Medications[1]   Allergies[2]  Social History   Socioeconomic History   Marital status: Married    Spouse name: Not on file   Number of children: 2   Years of education: GED   Highest education level: Not on file  Occupational History   Not on file  Tobacco Use   Smoking status: Former    Current packs/day: 0.00    Average packs/day: 1 pack/day for 5.0 years (5.0 ttl pk-yrs)    Types: Cigarettes    Start date: 07/20/1968    Quit date: 07/20/1973    Years since quitting: 51.4   Smokeless tobacco: Never  Vaping Use   Vaping status: Never Used  Substance and Sexual Activity   Alcohol use: No   Drug use: No   Sexual activity: Not on file  Other Topics Concern   Not on file  Social History Narrative   Not on file   Social Drivers of Health   Tobacco Use: Medium Risk (12/26/2024)   Patient History    Smoking Tobacco Use: Former    Smokeless Tobacco Use: Never    Passive Exposure: Not on Actuary Strain:  Not on file  Food Insecurity: Not on file  Transportation Needs: Not on file  Physical Activity: Not on file  Stress: Not on file  Social Connections: Not on file  Intimate Partner Violence: Not on file  Depression (EYV7-0): Not on file  Alcohol Screen: Not on file  Housing: Not on file  Utilities: Not on file  Health Literacy: Not on file     Review of Systems: General: negative for chills, fever, night sweats or weight changes.  Cardiovascular: negative for chest pain, dyspnea on exertion, edema, orthopnea, palpitations, paroxysmal  nocturnal dyspnea or shortness of breath Dermatological: negative for rash Respiratory: negative for cough or wheezing Urologic: negative for hematuria Abdominal: negative for nausea, vomiting, diarrhea, bright red blood per rectum, melena, or hematemesis Neurologic: negative for visual changes, syncope, or dizziness All other systems reviewed and are otherwise negative except as noted above.    Blood pressure (!) 146/70, pulse 64, height 5' 10 (1.778 m), SpO2 99%.  General appearance: alert and no distress Neck: no adenopathy, no JVD, supple, symmetrical, trachea midline, thyroid  not enlarged, symmetric, no tenderness/mass/nodules, and soft bilateral carotid bruits Lungs: clear to auscultation bilaterally Heart: regular rate and rhythm, S1, S2 normal, no murmur, click, rub or gallop Extremities: extremities normal, atraumatic, no cyanosis or edema Pulses: 2+ and symmetric Skin: Skin color, texture, turgor normal. No rashes or lesions Neurologic: Grossly normal  EKG not performed today      ASSESSMENT AND PLAN:   Carotid stenosis, bil, last dopplers 12/2013 50-69% Moderate bilateral ICA stenosis by duplex ultrasound most recently checked 11/28/2024.  Given his age and lack of symptoms I do not feel compelled to continue to follow this.  Essential hypertension History of essential hypertension blood pressure measured today 146/70.  He is on amlodipine , benazepril  and carvedilol .  Hyperlipidemia History of hyperlipidemia on statin therapy with lipid profile performed 03/10/2024 revealing total cholesterol 126.  Coronary artery disease History of CAD status post coronary artery bypass grafting in 2000 with a LIMA to his LAD, vein to first obtuse marginal branch, second obtuse marginal branch and RCA.  I performed right and left heart cath on him 10/05/2016 revealing patent grafts.  He denies chest pain or shortness of breath.  Severe aortic stenosis History of severe aortic stenosis  status post TAVR procedure with an Edwards valve by Drs. Thomas English and [facility] 11/03/2016.  His last 2D echo performed 01/10/2024 revealed normal LV systolic function with a well-functioning aortic bioprosthesis.  Given his age and lack of symptoms I did not feel compelled to continue to follow this noninvasively.  Heart block s/p Biotronic PPM 10/2016 History of complete pleat heart block at the time of TAVR status post permanent transvenous pacemaker implantation by Dr. Waddell who is followed him on an annual basis.     Thomas DOROTHA Lesches MD FACP,FACC,FAHA, FSCAI 12/26/2024 10:29 AM    [1]  Current Meds  Medication Sig   amLODipine  (NORVASC ) 10 MG tablet Take 10 mg by mouth daily with breakfast.    amoxicillin  (AMOXIL ) 500 MG capsule Take 4 tablets I hour before dental procedure   benazepril  (LOTENSIN ) 20 MG tablet Take 20 mg by mouth daily with breakfast.    carvedilol  (COREG ) 6.25 MG tablet Take 6.25 mg by mouth 2 (two) times daily with a meal.   Cholecalciferol (VITAMIN D3) 125 MCG (5000 UT) CAPS Take by mouth.   clopidogrel  (PLAVIX ) 75 MG tablet Take 75 mg by mouth daily.    levothyroxine  (SYNTHROID , LEVOTHROID) 100  MCG tablet Take 100 mcg by mouth at bedtime.   simvastatin  (ZOCOR ) 20 MG tablet Take 20 mg by mouth every evening.   [2] No Known Allergies  "

## 2024-12-26 NOTE — Assessment & Plan Note (Signed)
 History of essential hypertension blood pressure measured today 146/70.  He is on amlodipine , benazepril  and carvedilol .

## 2024-12-26 NOTE — Assessment & Plan Note (Signed)
 History of CAD status post coronary artery bypass grafting in 2000 with a LIMA to his LAD, vein to first obtuse marginal branch, second obtuse marginal branch and RCA.  I performed right and left heart cath on him 10/05/2016 revealing patent grafts.  He denies chest pain or shortness of breath.

## 2024-12-26 NOTE — Patient Instructions (Signed)

## 2024-12-26 NOTE — Assessment & Plan Note (Signed)
 History of severe aortic stenosis status post TAVR procedure with an Edwards valve by Drs. Cooper and [facility] 11/03/2016.  His last 2D echo performed 01/10/2024 revealed normal LV systolic function with a well-functioning aortic bioprosthesis.  Given his age and lack of symptoms I did not feel compelled to continue to follow this noninvasively.

## 2025-01-09 ENCOUNTER — Ambulatory Visit

## 2025-01-09 DIAGNOSIS — I442 Atrioventricular block, complete: Secondary | ICD-10-CM | POA: Diagnosis not present

## 2025-01-10 LAB — CUP PACEART REMOTE DEVICE CHECK
Battery Voltage: 45
Date Time Interrogation Session: 20260120093601
Implantable Lead Connection Status: 753985
Implantable Lead Connection Status: 753985
Implantable Lead Implant Date: 20171116
Implantable Lead Implant Date: 20171116
Implantable Lead Location: 753859
Implantable Lead Location: 753860
Implantable Lead Model: 377
Implantable Lead Model: 377
Implantable Lead Serial Number: 49591424
Implantable Lead Serial Number: 49679947
Implantable Pulse Generator Implant Date: 20171116
Pulse Gen Model: 407145
Pulse Gen Serial Number: 68853186

## 2025-01-11 ENCOUNTER — Ambulatory Visit: Payer: Self-pay | Admitting: Cardiology

## 2025-01-12 NOTE — Progress Notes (Signed)
 Remote PPM Transmission

## 2025-03-14 ENCOUNTER — Ambulatory Visit: Admitting: Student

## 2025-04-10 ENCOUNTER — Ambulatory Visit

## 2025-07-10 ENCOUNTER — Ambulatory Visit
